# Patient Record
Sex: Female | Born: 1940 | Race: White | Hispanic: No | Marital: Married | Smoking: Former smoker
Health system: Southern US, Community
[De-identification: ages and names within clinical notes are randomized; demographics above are authoritative.]

## PROBLEM LIST (undated history)

## (undated) DIAGNOSIS — M1711 Unilateral primary osteoarthritis, right knee: Secondary | ICD-10-CM

## (undated) DIAGNOSIS — M1990 Unspecified osteoarthritis, unspecified site: Secondary | ICD-10-CM

## (undated) DIAGNOSIS — R112 Nausea with vomiting, unspecified: Secondary | ICD-10-CM

## (undated) DIAGNOSIS — E039 Hypothyroidism, unspecified: Secondary | ICD-10-CM

## (undated) DIAGNOSIS — R42 Dizziness and giddiness: Secondary | ICD-10-CM

## (undated) DIAGNOSIS — G709 Myoneural disorder, unspecified: Secondary | ICD-10-CM

## (undated) DIAGNOSIS — K219 Gastro-esophageal reflux disease without esophagitis: Secondary | ICD-10-CM

## (undated) DIAGNOSIS — F419 Anxiety disorder, unspecified: Secondary | ICD-10-CM

## (undated) DIAGNOSIS — C4490 Unspecified malignant neoplasm of skin, unspecified: Secondary | ICD-10-CM

## (undated) DIAGNOSIS — E785 Hyperlipidemia, unspecified: Secondary | ICD-10-CM

## (undated) DIAGNOSIS — Z9889 Other specified postprocedural states: Secondary | ICD-10-CM

## (undated) HISTORY — PX: ABDOMINAL HYSTERECTOMY: SHX81

## (undated) HISTORY — PX: EYE SURGERY: SHX253

## (undated) HISTORY — PX: HAND SURGERY: SHX662

## (undated) HISTORY — PX: ESOPHAGOGASTRODUODENOSCOPY: SHX1529

## (undated) HISTORY — PX: COLONOSCOPY: SHX174

## (undated) HISTORY — PX: CHOLECYSTECTOMY: SHX55

## (undated) HISTORY — PX: APPENDECTOMY: SHX54

## (undated) HISTORY — PX: BACK SURGERY: SHX140

## (undated) HISTORY — PX: KNEE SURGERY: SHX244

---

## 2005-07-30 ENCOUNTER — Ambulatory Visit: Payer: Self-pay | Admitting: Family Medicine

## 2005-10-09 ENCOUNTER — Ambulatory Visit: Payer: Self-pay | Admitting: Unknown Physician Specialty

## 2005-11-12 ENCOUNTER — Ambulatory Visit (HOSPITAL_COMMUNITY): Admission: RE | Admit: 2005-11-12 | Discharge: 2005-11-13 | Payer: Self-pay | Admitting: Neurosurgery

## 2006-01-28 ENCOUNTER — Ambulatory Visit: Payer: Self-pay | Admitting: Unknown Physician Specialty

## 2006-01-28 ENCOUNTER — Other Ambulatory Visit: Payer: Self-pay

## 2006-01-29 ENCOUNTER — Ambulatory Visit: Payer: Self-pay | Admitting: Unknown Physician Specialty

## 2006-08-05 ENCOUNTER — Ambulatory Visit: Payer: Self-pay | Admitting: Family Medicine

## 2006-08-16 ENCOUNTER — Emergency Department (HOSPITAL_COMMUNITY): Admission: EM | Admit: 2006-08-16 | Discharge: 2006-08-17 | Payer: Self-pay | Admitting: Emergency Medicine

## 2006-08-17 ENCOUNTER — Ambulatory Visit (HOSPITAL_COMMUNITY): Admission: RE | Admit: 2006-08-17 | Discharge: 2006-08-17 | Payer: Self-pay | Admitting: Emergency Medicine

## 2006-08-19 ENCOUNTER — Ambulatory Visit: Payer: Self-pay | Admitting: Internal Medicine

## 2006-08-19 ENCOUNTER — Inpatient Hospital Stay (HOSPITAL_COMMUNITY): Admission: AD | Admit: 2006-08-19 | Discharge: 2006-08-23 | Payer: Self-pay | Admitting: General Surgery

## 2006-08-20 ENCOUNTER — Encounter (INDEPENDENT_AMBULATORY_CARE_PROVIDER_SITE_OTHER): Payer: Self-pay | Admitting: Specialist

## 2006-08-21 ENCOUNTER — Encounter (INDEPENDENT_AMBULATORY_CARE_PROVIDER_SITE_OTHER): Payer: Self-pay | Admitting: *Deleted

## 2006-08-27 ENCOUNTER — Ambulatory Visit (HOSPITAL_COMMUNITY): Admission: RE | Admit: 2006-08-27 | Discharge: 2006-08-27 | Payer: Self-pay | Admitting: Internal Medicine

## 2007-08-11 ENCOUNTER — Ambulatory Visit: Payer: Self-pay | Admitting: Family Medicine

## 2008-04-08 ENCOUNTER — Encounter: Admission: RE | Admit: 2008-04-08 | Discharge: 2008-04-08 | Payer: Self-pay | Admitting: Neurosurgery

## 2008-05-06 ENCOUNTER — Ambulatory Visit (HOSPITAL_BASED_OUTPATIENT_CLINIC_OR_DEPARTMENT_OTHER): Admission: RE | Admit: 2008-05-06 | Discharge: 2008-05-06 | Payer: Self-pay | Admitting: Orthopedic Surgery

## 2008-08-11 ENCOUNTER — Ambulatory Visit: Payer: Self-pay | Admitting: Family Medicine

## 2008-11-10 ENCOUNTER — Ambulatory Visit: Payer: Self-pay | Admitting: Unknown Physician Specialty

## 2009-06-27 ENCOUNTER — Ambulatory Visit (HOSPITAL_BASED_OUTPATIENT_CLINIC_OR_DEPARTMENT_OTHER): Admission: RE | Admit: 2009-06-27 | Discharge: 2009-06-27 | Payer: Self-pay | Admitting: Orthopedic Surgery

## 2009-06-27 ENCOUNTER — Encounter (INDEPENDENT_AMBULATORY_CARE_PROVIDER_SITE_OTHER): Payer: Self-pay | Admitting: Orthopedic Surgery

## 2009-08-04 ENCOUNTER — Emergency Department (HOSPITAL_COMMUNITY): Admission: EM | Admit: 2009-08-04 | Discharge: 2009-08-04 | Payer: Self-pay | Admitting: Emergency Medicine

## 2009-08-14 ENCOUNTER — Ambulatory Visit: Payer: Self-pay | Admitting: Family Medicine

## 2009-08-14 DIAGNOSIS — Z8639 Personal history of other endocrine, nutritional and metabolic disease: Secondary | ICD-10-CM

## 2009-08-14 DIAGNOSIS — K812 Acute cholecystitis with chronic cholecystitis: Secondary | ICD-10-CM | POA: Insufficient documentation

## 2009-08-14 DIAGNOSIS — Z9189 Other specified personal risk factors, not elsewhere classified: Secondary | ICD-10-CM | POA: Insufficient documentation

## 2009-08-14 DIAGNOSIS — Z862 Personal history of diseases of the blood and blood-forming organs and certain disorders involving the immune mechanism: Secondary | ICD-10-CM

## 2009-08-15 ENCOUNTER — Ambulatory Visit: Payer: Self-pay | Admitting: Internal Medicine

## 2009-08-15 DIAGNOSIS — K5732 Diverticulitis of large intestine without perforation or abscess without bleeding: Secondary | ICD-10-CM

## 2009-08-16 ENCOUNTER — Encounter: Payer: Self-pay | Admitting: Internal Medicine

## 2009-08-16 ENCOUNTER — Telehealth (INDEPENDENT_AMBULATORY_CARE_PROVIDER_SITE_OTHER): Payer: Self-pay

## 2009-08-18 ENCOUNTER — Encounter: Payer: Self-pay | Admitting: Internal Medicine

## 2009-08-21 ENCOUNTER — Encounter: Payer: Self-pay | Admitting: Internal Medicine

## 2010-07-31 ENCOUNTER — Ambulatory Visit (HOSPITAL_COMMUNITY): Admission: RE | Admit: 2010-07-31 | Discharge: 2010-07-31 | Payer: Self-pay | Admitting: Gastroenterology

## 2010-07-31 ENCOUNTER — Ambulatory Visit: Payer: Self-pay | Admitting: Gastroenterology

## 2010-07-31 ENCOUNTER — Encounter: Payer: Self-pay | Admitting: Gastroenterology

## 2010-08-21 ENCOUNTER — Ambulatory Visit: Payer: Self-pay | Admitting: Family Medicine

## 2011-01-22 NOTE — Assessment & Plan Note (Signed)
Summary: C/O  ABD PAIN/LAW   Visit Type:  f/u Primary Care Provider:  Dr. Terance Hart  Chief Complaint:  abd pain.  [name]: Jodi [name] is a pleasant 70 y/o WF, who presents for further evaluation of recurrent abd pain. She has h/o diverticulitis in past. Last episode 07/2009 (uncomplicated on CT).   TCS 11/09 by Dr. Lynnae Prude.  Two hyperplastic polyps.  Diverticulosis sigmoid and descending colon.    Recently had similar episode as with prior diverticulitis. Started four weeks ago. Initially woke up with vomiting, loose stool. Had severe pain in lower abd. Had to be still due to pain. She tolerated symptoms for three weeks and called last week to make appt with [place]. No abx this time. Some better the last two days. Now BMs are balls then more normal stool later in the day (2-3 per day). Takes Dulcolax 2 by mouth nightly. Did not tolerate Miralax due to n/v. No brbpr, melena. No known fever. No upper gi symtpoms.  Eats three fiber bars daily. Tolerates well.    Current Medications (verified): 1)  Premarin 0.3 Mg Tabs (Estrogens Conjugated) .... Takes Every 3 Days 2)  Pm Pain Reliever .... Take 1 Tab By Mouth At Bedtime 3)  Multivitamins .... Once Daily 4)  Glucosamine-Chondroitin  Caps (Glucosamine-Chondroit-Vit C-Mn) .... Once Daily 5)  Calcium 500 Mg Tabs (Calcium Carbonate) .... Once Daily 6)  Melatonin 5 Mg Tabs (Melatonin) .... At Bedtime 7)  Vitamin C .... Once Daily 8)  Potassium .... Once Daily 9)  Ginko Biloba .... Once Daily 10)  Cissus .... Once Daily 11)  Tonic Water .... Once Daily 12)  Dulcolax 5 Mg Tbec (Bisacodyl) .... 2 By Mouth Nightly  Allergies (verified): 1)  ! Percocet 2)  ! Hydrocodone-Acetaminophen (Hydrocodone-Acetaminophen)  Review of Systems      See HPI  Vital Signs:  Patient profile:   70 year old female Height:      64 inches Weight:      198 pounds BMI:     34.11 Temp:     97.9 degrees F oral Pulse rate:   72 / minute BP  sitting:   118 / 84  (left arm) Cuff size:   regular  Vitals Entered By: Hendricks Limes LPN (July 31, 2010 9:29 AM)  Physical Exam  General:  Well developed, well nourished, no acute distress. Head:  Normocephalic and atraumatic. Eyes:  sclera nonicteric Mouth:  op moist Abdomen:  Soft. Positive BS. Moderate tenderness in lower abd with subjective guarding. No rebound. No HSM or masses.  Extremities:  No clubbing, cyanosis, edema or deformities noted. Neurologic:  Alert and  oriented x4;  grossly normal neurologically. Skin:  Intact without significant lesions or rashes. Psych:  Alert and cooperative. Normal mood and affect.  Impression & Recommendations:  Problem # 1:  DIVERTICULITIS, COLON (ICD-562.11) Suspect recurrent diverticulitis. Symptoms ongoing for several weeks now. Based on exam, recommend CT A/P to r/o complicated diverticulitis. Cipro/Flagy for 14 days.  Orders: T-BUN ([id number]) T-Creatinine Blood ([id number]) Est. Patient Level II (13086) Prescriptions: CIPROFLOXACIN HCL 500 MG TABS (CIPROFLOXACIN HCL) one by mouth two times a day for 14 days  #28 x 0   Entered and Authorized by:   Leanna Battles. Dixon Boos   Signed by:   Leanna Battles Lewis PA-C on 07/31/2010   Method used:   Electronically to        [facility]  [address] (retail)  [address]       [place], [place]  [address]       Ph: [phone]       Fax: [phone]   RxID:   [id number] METRONIDAZOLE 500 MG TABS (METRONIDAZOLE) one by mouth two times a day for 14 days  #28 x 0   Entered and Authorized by:   Leanna Battles. Dixon Boos   Signed by:   Leanna Battles Lewis PA-C on 07/31/2010   Method used:   Electronically to        [facility] (retail)       [address]       [place], [place]  [address]       Ph: [phone]       Fax: [phone]   RxID:   [id number]

## 2011-01-22 NOTE — Letter (Signed)
Summary: CT order   CT order   Imported By: Peggyann Shoals 07/31/2010 10:24:53  _____________________________________________________________________  External Attachment:    Type:   Image     Comment:   External Document

## 2011-03-08 LAB — BUN: BUN: 10 mg/dL (ref 6–23)

## 2011-03-08 LAB — CREATININE, SERUM
Creatinine, Ser: 1.02 mg/dL (ref 0.4–1.2)
GFR calc Af Amer: >60 mL/min (ref >60)
GFR calc non Af Amer: 54 mL/min — ABNORMAL LOW (ref >60)

## 2011-03-30 LAB — URINALYSIS, ROUTINE W REFLEX MICROSCOPIC
Bilirubin Urine: NEGATIVE
Glucose, UA: NEGATIVE mg/dL
Hgb urine dipstick: NEGATIVE
Nitrite: NEGATIVE
Protein, ur: NEGATIVE mg/dL
Specific Gravity, Urine: >1.03 — ABNORMAL HIGH (ref 1.005–1.030)
Urobilinogen, UA: 0.2 mg/dL (ref 0.0–1.0)
pH: 5.5 (ref 5.0–8.0)

## 2011-03-30 LAB — COMPREHENSIVE METABOLIC PANEL
ALT: 16 U/L (ref 0–35)
AST: 20 U/L (ref 0–37)
Albumin: 3.6 g/dL (ref 3.5–5.2)
Alkaline Phosphatase: 57 U/L (ref 39–117)
BUN: 14 mg/dL (ref 6–23)
CO2: 28 mEq/L (ref 19–32)
Calcium: 9.4 mg/dL (ref 8.4–10.5)
Chloride: 103 mEq/L (ref 96–112)
Creatinine, Ser: 1.12 mg/dL (ref 0.4–1.2)
GFR calc Af Amer: 58 mL/min — ABNORMAL LOW (ref >60)
GFR calc non Af Amer: 48 mL/min — ABNORMAL LOW (ref >60)
Glucose, Bld: 92 mg/dL (ref 70–99)
Potassium: 4 mEq/L (ref 3.5–5.1)
Sodium: 138 mEq/L (ref 135–145)
Total Bilirubin: 0.7 mg/dL (ref 0.3–1.2)
Total Protein: 6.3 g/dL (ref 6.0–8.3)

## 2011-03-30 LAB — URINE CULTURE: Colony Count: 10000

## 2011-03-30 LAB — DIFFERENTIAL
Basophils Absolute: 0 10*3/uL (ref 0.0–0.1)
Basophils Relative: 0 % (ref 0–1)
Eosinophils Absolute: 0.3 10*3/uL (ref 0.0–0.7)
Eosinophils Relative: 3 % (ref 0–5)
Lymphocytes Relative: 13 % (ref 12–46)
Lymphs Abs: 1.3 10*3/uL (ref 0.7–4.0)
Monocytes Absolute: 0.8 10*3/uL (ref 0.1–1.0)
Monocytes Relative: 8 % (ref 3–12)
Neutro Abs: 8.1 10*3/uL — ABNORMAL HIGH (ref 1.7–7.7)
Neutrophils Relative %: 76 % (ref 43–77)

## 2011-03-30 LAB — CBC
HCT: 37.8 % (ref 36.0–46.0)
Hemoglobin: 13.4 g/dL (ref 12.0–15.0)
MCHC: 35.3 g/dL (ref 30.0–36.0)
MCV: 93.8 fL (ref 78.0–100.0)
Platelets: 208 10*3/uL (ref 150–400)
RBC: 4.03 MIL/uL (ref 3.87–5.11)
RDW: 12.7 % (ref 11.5–15.5)
WBC: 10.6 10*3/uL — ABNORMAL HIGH (ref 4.0–10.5)

## 2011-03-30 LAB — LIPASE, BLOOD: Lipase: 18 U/L (ref 11–59)

## 2011-03-31 LAB — POCT HEMOGLOBIN-HEMACUE: Hemoglobin: 13.6 g/dL (ref 12.0–15.0)

## 2011-05-07 NOTE — Op Note (Signed)
NAMESHAWNIECE, OYOLA               ACCOUNT NO.:  [id number]   MEDICAL RECORD NO.:  [id number]          PATIENT TYPE:  AMB   LOCATION:  DSC                          FACILITY:  [facility]   PHYSICIAN:  Katy Fitch. Sypher, M.D. 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   POSTOP DIAGNOSES:  Locking stenosing tenosynovitis of right index and  right ring fingers and locking stenosing tenosynovitis of left ring  finger.   OPERATION:  1. Release of right index finger, A1 pulley and tenosynovectomy of      superficialis profundus tendons.  2. Release of right ring finger, A1 pulley.  3. Release of left ring finger, A1 pulley.   OPERATING SURGEON:  Katy Fitch. Sypher, MD   ASSISTANT:  Marveen Reeks Dasnoit, P-AC.   ANESTHESIA:  General sedation/2% lidocaine metacarpal head level block  and flexor sheath block of right index finger, right ring finger, and  left ring finger.  Supervising anesthesiologist is Dr. Jairo Ben.   INDICATIONS:  Jodi [name] is a 70 year old woman referred to the  courtesy of Dr. Maeola Harman for evaluation and management of multiple  locking fingers.   Clinical examination revealed signs of bilateral ring finger stenosing  synovitis and a locked and stiff right index finger.   Ms. Trigg had a traumatic deficit between active and passive motion of  the index finger.   After detailed informed consent in the office as well as informed  consent at the ambulatory surgical center, she is brought to the  operating room at this time anticipating release of her right index  finger, A1 pulley, her right ring finger, A1 pulley, and her left ring  finger, A1 pulley.   Preoperatively, questions were invited and answered in detail.   She  understands that we may need to perform a synovectomy to recover full  motion of the right index finger.   PROCEDURE:  Tenleigh Byer is brought to the operating room and placed in  supine position on the operating table.   Following an anesthesia consult with Dr. Jean Rosenthal, monitored anesthesia  care was recommended and accepted.   Ms. Vinal was brought to room 5, placed in supine position on the  operating table, and IV sedation provided.  The right arm was prepped  with Betadine soap and solution, sterilely draped.  A pneumatic  tourniquet was applied to the proximal brachium.  IV access was provided  through the left antecubital fossa.   The right and left arms were prepped with Betadine soap solution,  sterilely draped.  Following exsanguination of the right arm with an  Esmarch bandage, an arterial tourniquet on the proximal brachium  inflated to 220 mmHg.  The procedure commenced with a short oblique  incision directly over the A1 pulleys of the right index and right ring  fingers.  Subcutaneous tissues were carefully divided releasing the  palmar  fascia.  The A1 pulleys were isolated.   In the index finger, there was noted be hypertrophic tenosynovium and a  ganglion cyst proximal at the A1 pulley.  The cyst was excised.  The A1  pulley released.  The tendons delivered, and a fibrotic tenosynovial  cuff removed allowing motion of the superficialis and profundus tendons  to return.  Thereafter, full active and passive range of motion of the  fingers recovered.   Bleeding points were electrocauterized by bipolar current followed by  repair of the skin with horizontal mattress sutures of 5-0 nylon.  Attention then directed to the right ring finger.  Short transverse  incision was used to expose the palmar fascia.  This was released with  scissors followed by isolation at the A1 pulley.  The pulley was split  in its midline with scalpel and scissors.  Tendons delivered and  found  not to have an investing cuff of fibrotic tenosynovium.   This wound was likewise repaired with mattress suture of 5-0 nylon.  The  right hand was then dressed with Xeroflo sterile gauze and Ace bandage.  The tourniquet was released with immediate capillary fill of the fingers  and thumb.  Our attention was then directed to the left hand.  The left  hand, wrist, and distal forearm were exsanguinated with an Esmarch  bandage and an Esmarch used on the proximal forearm tourniquet.  A short  incision was fashioned directly over the left ring finger A1 pulley.  Subcutaneous tissues were carefully divided taking care to release of  the palmar fascia.  The pulley was isolated, followed by placement of  retractors.  The A1 pulley was split with scalpel and scissors.  The  tendons delivered.  There was no significant cuff of fibrotic  tenosynovium.   Thereafter, full active range of motion of the ring fingers recovered.   The wound was then repaired with mattress sutures of 5-0 nylon.  The  left hand was dressed with Xeroflo sterile gauze and Tegaderm dressing  to allow exposure to water immediately.  An Ace wrap was provided for  compression.   Ms. Testerman was awakened from sedation, transferred to the recovery room  in stable signs.  She will return to the care of her husband with a  prescription for Darvocet-N 100 one p.o. q. 4-6 hours p.r.n. pain, 20  tablets without refill.      Katy Fitch Sypher, M.D.  Electronically Signed     RVS/MEDQ  D:  05/06/2008  T:  05/06/2008  Job:  [id number]

## 2011-05-07 NOTE — Op Note (Signed)
NAMEHINLEY, BRIMAGE               ACCOUNT NO.:  [id number]   MEDICAL RECORD NO.:  [id number]          PATIENT TYPE:  AMB   LOCATION:  DSC                          FACILITY:  [facility]   PHYSICIAN:  Katy Fitch. Sypher, M.D. 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   OPERATION:  1. Release of A1 pulley, left index finger, followed by      tenosynovectomy of superficialis profundus tendon for synovial      biopsy.  2. A1 pulley release, left long finger with tenosynovectomy of flexor      digitorum superficialis and profundus tendons for biopsy.   OPERATING SURGEON:  Katy Fitch. Sypher, MD   ASSISTANT:  Marveen Reeks. Dasnoit, PA-C   ANESTHESIA:  Lidocaine 2% metacarpal head level block and flexor sheath  block of left index and long finger supplemented by IV sedation.   SUPERVISING ANESTHESIOLOGIST:  Dr. Jean Rosenthal.   INDICATIONS:  Jodi [name] is a 70 year old woman familiar with our  practice.  She has a history of stenosing tenosynovitis treated in the  past with trigger finger release.  She returned with recurrent stenosing  tenosynovitis of left index and long fingers.  She has had a previous  left ring finger A1 pulley release.   We offered steroid injection versus surgery to relieve the A1 pulley  stenosis.  Having had a past experience with injection, she elected to  proceed directly to release of A1 pulleys.  Also, she was noted  preoperatively to have marked swelling of her flexor sheaths.  We  advised her that there was a significant chance we need to perform a  synovial biopsy and tenosynovectomy to resolve this predicament.   After informed consent, she was brought to the operating  room at this  time.   PROCEDURE:  Jannett Schmall is brought to the operating room and placed in  supine position upon the operating table.   Following light sedation, the left arm was prepped with Betadine soap  solution and sterilely draped.  A pneumatic tourniquet was applied  proximal left brachium.   Following exsanguination of the left arm with Esmarch bandage, arterial  tourniquet was inflated to 230 mmHg.   Procedure commenced with oblique incision directly over the A1 pulleys.   Soft tissue was gently divided revealing the palmar fascia.  The  percutaneous fibers of the palmar fascia was released superficial to the  flexor sheath.  Blunt retractors were placed protecting the  neurovascular structures.  The A1 pulley of the index finger was noted  to be quite thickened, inflamed, and with tenosynovium bulging  proximally.   The A1 pulley was split with scalpel and scissors followed by elevation  of the superficialis profundus tendons.  The tendons were invested with  a rather inflammatory tenosynovium that was not highly vascular.  This  was meticulously stripped with  scissors and rongeur dissection.  All of  the removed tenosynovium was passed off in formalin for pathologic  evaluation.   A similar procedure was performed for the long finger where there was  marked tenosynovitis noted at the level of the A1 pulley and preputial  fold.  All tenosynovium was stripped from the superficialis profundus  tendons.   Ms. Mittal had a small degree of discomfort during stripping of the  profundus tendon of the long finger, which was treated with additional  lidocaine anesthesia proximally in the palm.   The wounds were checked for bleeding points.  They were subsequently  repaired with interrupted suture of 5-0 nylon.   COMPLICATIONS:  There were no apparent complications.   Ms. Magda tolerated surgery and anesthesia well.  She was transferred  to recovery room in stable  signs.   She was noted to be tearful during the procedure at induction of  sedation and during the procedure.  Our nurse anesthetist repeatedly  after what was troubling her.  We did not ever receive a answer.  She  was fairly deeply sedated during the procedure.   Ms. Brazzle was transferred to recovery room in stable signs.  We will  discharge her to the care of her husband with a prescription for  Darvocet- N 100 1 p.o. for 6 hours p.r.n. pain 20 tablets refill.      Katy Fitch Sypher, M.D.  Electronically Signed     RVS/MEDQ  D:  06/27/2009  T:  06/28/2009  Job:  [id number]

## 2011-05-10 NOTE — Op Note (Signed)
NAMEALVINE, Jodi [name]               ACCOUNT NO.:  [id number]   MEDICAL RECORD NO.:  [id number]          PATIENT TYPE:  INP   LOCATION:  A331                          FACILITY:  [facility]   PHYSICIAN:  Lionel December, M.D.    DATE OF BIRTH:  [birth year]   DATE OF PROCEDURE:  08/20/2006  DATE OF DISCHARGE:                                  PROCEDURE NOTE   PROCEDURE:  Endoscopic retrograde cholangiopancreatogram with endoscopic  sphincterotomy, stone extraction, placement of a pancreatic stent and cold  biopsy.   ENDOSCOPIST:  Lionel December, M.D.   INDICATIONS:  Jodi [name] is a 70 year old Caucasian female who presents with  right upper quadrant pain.  On workup, she has cholelithiasis, a very  dilated bile duct and suspected have a CBD stone.  Her transaminase are also  elevated.  She is undergoing therapeutic ERCP.  Procedure risks were  reviewed with the patient and informed consent was obtained.   MEDICATIONS FOR CONSCIOUS SEDATION AND ANESTHESIA:  Please see anesthesia  record for details.  For the procedure, she was given glucagon 0.75 mg IV.   FINDINGS:  Procedure was performed in the OR.  After the patient was placed  under anesthesia, she was intubated and placed in a semi-prone position.  A  therapeutic Olympus videoscope duodenoscope was passed via oropharynx  without any difficulty into esophagus and stomach.  Antral mucosa was  normal.  Scope was passed across a normal-appearing pyloric channel into  bulb and descending duodenum.  Ampulla of Vater was normal.  CBD was  cannulated with RX 44 Hydratome 0.035 Hydra Jagwire.  CBD was selectively  cannulated.  The CBD and CHD were markedly dilated, about 14-15 mm in  diameter.  Intrahepatic biliary radicals were also filled and prominent.  There was no obvious filling defect.  Sphincterotomy was performed with a  gush of contrast and bile.  There was single 5- to 6-mL stone that came out  spontaneously.  The stone balloon extractor was  passed through the bile duct  and across the ampulla to a diameter of 10-12 mm and sphincterotomy was wide  open.  The submucosa at sphincterotomy had a small nodule.  It was decided  to obtain a pancreatogram.  The pancreatic duct was gently filled with a  dilute contrast.  Normal caliber and no filling defects.  A 5-French 3-cm 18  Geenan stent was placed in the pancreatic duct to minimize risk of  pancreatitis.  Biopsy was taken from this nodular submucosa; only 2 samples  were obtained as it was a rather small piece of tissue.  Endoscope was  withdrawn.  The patient tolerated the procedure well and she is in the  process of being extubated.   FINAL DIAGNOSES:  1. Markedly dilated biliary system with a single stone removed following      sphincterotomy.  2. Abnormal appearance to ampullary submucosal, which were biopsied for      Histology.  3. A 5-French 3-cm Geenan stent placed in the pancreatic duct to minimize      risk of pancreatitis.   RECOMMENDATIONS:  1. Clear  liquids.  .  2. She will have a CBC, LFTs and amylase in a.m.  3. For laparoscopic cholecystectomy in a.m.  .  4. She will return for plain abdominal film to document the pancreatic      stent has pass spontaneously.      Lionel December, M.D.  Electronically Signed     NR/MEDQ  D:  08/20/2006  T:  08/21/2006  Job:  [id number]   cc:   Barbaraann Barthel, M.D.  Fax: [phone]

## 2011-05-10 NOTE — Consult Note (Signed)
Jodi [name], [name]               ACCOUNT NO.:  [id number]   MEDICAL RECORD NO.:  [id number]          PATIENT TYPE:  INP   LOCATION:  A331                          FACILITY:  [facility]   PHYSICIAN:  R. Roetta Sessions, M.D. DATE OF BIRTH:  02-15-1941   DATE OF CONSULTATION:  08/19/2006  DATE OF DISCHARGE:                                   CONSULTATION   REASON FOR CONSULTATION:  Elevated liver function tests, need ERCP.   [name]:  The patient is a 70 year old Caucasian female  who was admitted today with acute cholecystitis.  She came to the ED on  Saturday with the acute onset of epigastric pain which she felt might be  indigestion.  She was evaluated in the ER, had normal LFTs except for an AST  of 55.  Her amylase was 119 and lipase 25.  She came back Sunday morning for  abdominal ultrasound and was found to have sludge and small gallstones  within the gallbladder, extrahepatic and mild intrahepatic biliary ductal  dilatation, the entire common bile duct could not be seen, therefore,  additional CBD stone was not excluded.  Her pancreatic duct measured 3-4 mm,  slightly prominent, and her proximal common bile duct was 11.5 mm.  She has  splenic and liver granulomata, as well.  She had a CT which revealed a CBD  of 11 mm at the pancreatic head, there was distention of the gallbladder and  a distal CBD stone could not be excluded.  She saw Dr. Malvin Johns today in his  office and was directly admitted for worsening abdominal pain.  She had an  urgent abdominal ultrasound which revealed the gallbladder wall slightly  thickened, at this point, at 3.5 mm and CBD was, again, 11-12 mm.  Her LFTs  were noted to be elevated, total bilirubin 1.5, alkaline phos 247, AST 467,  ALT 506, albumin 3.4, white count 8800.  The patient started having right  upper quadrant abdominal pain this morning.  She has had nausea but no  vomiting.  She has chronic intermittent constipation and has  not had a bowel  movement in a couple of days.  She denies any melena, rectal bleeding,  fever, or chills.   MEDICATIONS AT HOME:  Premarin 0.3 mg every three days, quinidine sulfate  b.i.d., Feldene 20 mg q.h.s., PM pain reliever every night.   ALLERGIES:  1. DARVOCET.  2. PERCOCET, causes itching if she takes several of them within a 24 hour      period of time.   PAST MEDICAL HISTORY:  Negative for chronic illnesses except for chronic leg  cramps.  She had neck surgery in 1999 and 2006.  Appendectomy in 1969.  Back  surgery in 1998.  Hysterectomy with BSO in 1997.  Left knee surgery in 2007.  Arthroscopy left foot surgery 1999.  Colonoscopy in the mid 1990s with  diverticulosis and pin point ulcers, according to the patient.  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   FAMILY HISTORY:  Negative for chronic GI illnesses, colorectal cancer, or  liver disease.   SOCIAL HISTORY:  She is married with three grown children.  She quit smoking  over four years ago.  No alcohol use.   REVIEW OF SYSTEMS:  Per HPI for GI.  CONSTITUTIONAL:  No weight loss.  CARDIOPULMONARY:  No chest pain or shortness of breath.   PHYSICAL EXAMINATION:  VITAL SIGNS:  Temperature 98.4, pulse 74, respirations 20, blood pressure  118/64, height 64 inches, weight 182.  GENERAL:  Pleasant, elderly moderately obese Caucasian female in no acute  distress.  SKIN:  Warm and dry, no jaundice.  HEENT:  Sclerae nonicteric.  Oropharyngeal mucosa moist and pink.  No  lymphadenopathy or thyromegaly.  CHEST:  Lungs clear to auscultation.  CARDIAC:  Exam reveals regular rate and rhythm, normal S1 and S2, no  murmurs, gallops, and rubs.  ABDOMEN:  Positive bowel sounds, obese but symmetrical, soft.  She has mild  to moderate right upper quadrant abdominal pain with positive Murphy's sign,  no rebound tenderness.  No hepatosplenomegaly or masses.  EXTREMITIES:  No edema.   LABORATORY  DATA:  As mentioned above.  In addition, hemoglobin 13.2, BUN 10,  creatinine 1, potassium 3.9, sodium 135, platelets 242,000.   IMPRESSION:  The patient is a 70 year old lady with acute cholecystitis and  probable choledocholithiasis given dilated CBD and new onset elevated LFTs.   RECOMMENDATIONS:  1. ERCP tomorrow followed by cholecystectomy as per Dr. Malvin Johns.  2. Will repeat LFTs, amylase, and lipase in the morning.  3. PT, PTT, bleeding time today.  4. Levaquin 250 mg IV on call to ERCP.  5. ERCP with possible sphincterotomy and stone extraction plus/minus      stenting explained to the patient regarding risks, alternatives, and      benefits.  10% chance of post ERCP pancreatitis explained to the      patient as well as risk of bleeding and infection.  The patient's      questions were answered and she has consented to proceed with procedure      as planned.      Tana Coast, P.AJonathon Bellows, M.D.  Electronically Signed    LL/MEDQ  D:  08/19/2006  T:  08/19/2006  Job:  [id number]   cc:   Barbaraann Barthel, M.D.  Fax: [phone]

## 2011-05-10 NOTE — Discharge Summary (Signed)
Jodi [name], Jodi [name]               ACCOUNT NO.:  [id number]   MEDICAL RECORD NO.:  [id number]          PATIENT TYPE:  INP   LOCATION:  A331                          FACILITY:  [facility]   PHYSICIAN:  Barbaraann Barthel, M.D. DATE OF BIRTH:  [birth year]   DATE OF ADMISSION:  08/19/2006  DATE OF DISCHARGE:  09/01/2007LH                                 DISCHARGE SUMMARY   DIAGNOSES:  1. Choledocholithiasis.  2. Cholecystitis.  3. Cholelithiasis.   PROCEDURES:  1. ERCP on August 20, 2006.  2. August 21, 2006, laparoscopic cholecystectomy.   CONSULTATIONS:  Dr. Karilyn Cota of the GI service.   NOTE:  This is a 70 year old white female who was admitted with a several-  day history of abdominal pain, radiating to her back.  She had a sonogram  which was positive for stones.  She initially had liver function studies  that were within normal limits, with a normal amylase, however, her liver  function studies increased.  When she was admitted a repeat sonogram  revealed a dilated common bile duct and a bilirubin 1.5 with very elevated  liver function studies.  I obtained a GI consult, suspecting  choledocholithiasis.  This was confirmed at ERCP, were choledocholithotomy  was performed, a sphincterotomy was performed, and a stent was placed in the  pancreatic duct.  The patient then, in 24 hours, underwent a laparoscopic  cholecystectomy.  This was uneventful.  We drained this as well, and there  was no problem for this.  On the second postoperative day there was minimal  Jackson-Pratt drainage.  Her abdomen was much improved, and she was  tolerating liquids without nausea or vomiting, and she was doing quite well,  and was discharged on her second postoperative day.   LABORATORY FUNCTIONS:  The patient, on August 19, 2006, had a white count of  8.8 with an H&H of 13.2 and 38.6.  On August 22, 2006, her white count was  10,000 with an H&H 11.2 and 32.7.  Her metabolic-7 was grossly within normal  limits.  Her PT, INR and PTT were not elevated.  She, as stated, had a bump  in her liver function studies when she was admitted with a bilirubin that  was essentially normal, then went up to 1.5, and AST and ALTs very elevated.  These later, after surgery, diminished with her bilirubin remaining within  normal limits, and a downward trend of her liver function studies.  She  continued to improve significantly.  At time of discharge she was tolerating  p.o. well, her wounds were clean, her Jackson-Pratt drain was removed, after  noting just minimal drainage of serosanguineous nature.  All of her wounds  were clean and she had no leg pain, shortness of breath or any other  discomfort.  She was discharged and the follow-up arrangements were being  made.  She will see me in my office on August 28, 2006.  She is discharged  on Darvocet-N 100 one tablet every 4 hours as needed for pain, and to take  milk of magnesia 1 ounce as needed for her bowels, and  she will have Colace  also 100 mg as needed for her bowels, and she is told to refrain from any  aspirin products and to continue to continue any other medications from her  family doctors,  her preoperative medicines as per her family doctor.  She  was also given a prescription for Ambien 5 mg to help her at night, hour of  sleep.  She is excused from work.  She is told to advance her activity as  tolerated.  She is permitted go indoors and outdoors, and up and down the  stairs.  She is told to no heavy lifting,  straining or sex or driving at the present time, told to clean her wounds  with alcohol and change the drain site as needed.  She has been given  materials for this.  We will follow her perioperatively.  She is told to  call me should she have any problems and she will make arrangements with the  GI service as well for follow-up.      Barbaraann Barthel, M.D.  Electronically Signed     WB/MEDQ  D:  08/23/2006  T:  08/25/2006   Job:  [id number]   cc:   Lionel December, M.D.  [address]  [place]  [place] [address]

## 2011-05-10 NOTE — Op Note (Signed)
Jodi [name], Jodi [name]               ACCOUNT NO.:  [id number]   MEDICAL RECORD NO.:  [id number]          PATIENT TYPE:  INP   LOCATION:  3009                         FACILITY:  [facility]   PHYSICIAN:  Danae Orleans. Venetia Maxon, M.D.  DATE OF BIRTH:  14-Aug-1941   DATE OF PROCEDURE:  11/12/2005  DATE OF DISCHARGE:                                 OPERATIVE REPORT   PREOPERATIVE DIAGNOSES:  Cervical herniated disk with cervical spondylosis,  degenerative disk disease, and cervical radiculopathy, C4-C5 and C6-C7  levels.   POSTOPERATIVE DIAGNOSES:  Cervical herniated disk with cervical spondylosis,  degenerative disk disease, and cervical radiculopathy, C4-C5 and C6-C7  levels.   PROCEDURE:  Anterior cervical decompression and fusion, C4-C5 and C6-C7  levels with Peak interbody anterior cervical cages with morcellized bone  autograft with anterior cervical plating.   SURGEON:  Danae Orleans. Venetia Maxon, M.D.   ASSISTANT:  Hilda Lias, M.D.   ANESTHESIA:  General endotracheal anesthesia.   ESTIMATED BLOOD LOSS:  Minimal.   COMPLICATIONS:  None.   DISPOSITION:  Recovery.   INDICATIONS:  Jodi [name] is a 70 year old woman with cervical  spondylitic disk herniation and significant neck and upper extremity pain  and weakness.  She had kyphosis centered at C4-C5 with a considerable amount  of neck pain.  It was elected to perform an anterior cervical decompression  and fusion at the C4-C5 and C6-C7 levels.   DESCRIPTION OF PROCEDURE:  Ms. Gueye was brought to the operating room.  Following a satisfactory and uncomplicated induction of general endotracheal  anesthesia and placement of intravenous lines, the patient was placed in a  supine position on the operating table.  Her neck was placed in slight  extension. She was placed in 10 pounds of Halter traction.  Her anterior  neck was then prepped and draped in the usual sterile fashion.  The area of  intended incision was infiltrated with 0.25%  Marcaine and 0.5% lidocaine  with 1:200,000 epinephrine.  The incision was made on the right side of the  midline because of her previous exposure was done on the right side.  She  had previously had a C5-C6 anterior cervical decompression in the past.  The  platysmal layer was incised.  Subplatysmal dissection was performed exposing  the anterior border of the sternocleidomastoid muscle keeping the esophagus  and trachea medial and the carotid sheath lateral.  The soft tissues were  dissected exposing the C4-C5 and C6-C7 levels with the previously operated  C5-C6 level.  A bent spinal needle was placed at what was felt to be the C4-  C5 level, and this was confirmed on intraoperative x-ray.  Subsequently, the  longus colli muscles were taken down from the anterior cervical spine from  C4-C5 and C6-C7, and initially at the C6-C7 level, a self-retaining  retractor was placed to facilitate exposure.  The microscope was brought  into the field.  The disk space was evacuated of disk material, and there  was significant spondylitic degeneration at this level.  A disk space  spreader was placed.  A high-speed drill was used under microscopic  visualization to decorticate the endplates of C6 and C7.  A large central  disk herniation was removed decompressing the central spinal cord dura and  both C7 nerve roots as they extended out the neural foraminae.  After trial  sizing, a 6-mm Peak interbody cage was selected, packed with morcellized  bone autograft which was obtained from the drilling of the endplates.  This  was then tamped into position into the interspace and countersunk  appropriately.  Attention was then turned to the C4-C5 level where a similar  decompression was performed.  At this level, because of the significant  kyphosis, a slightly larger graft was placed.  A 7-mm graft was placed, also  packed with morcellized bone autograft, inserted in the interspace, and  countersunk  appropriately.  After taking the traction weight off, a 14-mm  anterior cervical plate was affixed to the anterior cervical spine using  variable angle 14-mm screws, two at C4, two at C5, and at the C6-C7 level, a  16-mm plate was placed with similarly size screws.  All screws had excellent  purchase.  Locking mechanisms were engaged.  The final x-ray demonstrated  well positioned anterior cervical plates and interbody grafts.  The wound  was then copiously irrigated with bacitracin/saline. The soft tissues were  inspected and found to be in good repair.  The platysmal layer was  reapproximated with 3-0 Vicryl sutures.  The skin edges were reapproximated  with interrupted 3-0 Vicryl subcuticular stitch.  The wound was dressed with  Dermabond.  The patient was extubated in the operating room and taken to the  recovery room in stable, satisfactory condition having tolerated here  operation well.  Counts were correct at the end of the case.      Danae Orleans. Venetia Maxon, M.D.  Electronically Signed     JDS/MEDQ  D:  11/12/2005  T:  11/12/2005  Job:  [id number]

## 2011-05-10 NOTE — Op Note (Signed)
Jodi [name], Jodi [name]               ACCOUNT NO.:  [id number]   MEDICAL RECORD NO.:  [id number]          PATIENT TYPE:  INP   LOCATION:  A331                          FACILITY:  [facility]   PHYSICIAN:  Barbaraann Barthel, M.D. DATE OF BIRTH:  12-Jun-1941   DATE OF PROCEDURE:  08/21/2006  DATE OF DISCHARGE:  08/23/2006                                 OPERATIVE REPORT   NOTE:  This the second dictation.  The first one was apparently lost in the  computer system.   SURGEON:  Barbaraann Barthel, M.D.   PREOPERATIVE DIAGNOSES:  1. Choledocholithiasis, status post endoscopic retrograde      cholangiopancreatography on August 20, 2006.  2. Cholecystitis.  3. Cholelithiasis.   PROCEDURES:  Laparoscopic cholecystectomy.   NOTE:  This is a 70 year old white female who was admitted for biliary colic  of several days' duration.  She was admitted with elevated liver function  studies suggestive of common bile duct stones.  We consulted the GI service  and plans were made for an ERCP prior to cholecystectomy.  This was carried  out by the GI service on August 20, 2006, and we made plans for a  laparoscopic cholecystectomy.  We discussed the procedure in detail with the  patient, discussing complications not limited to but including bleeding,  infection, damage to bile ducts, perforation of organs, transitory diarrhea  and the possibility that an open cholecystectomy might be required.  Informed consent was obtained.   GROSS OPERATIVE FINDINGS:  Those suggestive of acute cholecystitis with  cholelithiasis, right upper quadrant otherwise appeared to be normal.  There  was stones within the gallbladder.   TECHNIQUE:  The patient was placed in supine position.  After the adequate  administration of general anesthesia via endotracheal intubation, her entire  was prepped with Betadine solution and draped the in usual manner.  A Foley  catheter had been aseptically inserted.  Prior to this, after prepping  and  draping, a periumbilical incision was carried out over the superior aspect  of the umbilicus.  The fascia was grasped with a sharp towel clip and a  Veress needle was inserted and confirmed in position with a saline drop  test.  We then insufflated the abdomen with approximately 3.5 L of CO2 and  then using the Visiport technique, an 11 mm cannula was placed in the  umbilicus and then an 11-mm cannula and two 5-mm cannulas were placed in the  abdomen under direct vision.  The 11 mm cannula was placed in the  epigastrium and 5 mm cannulas in the right upper quadrant laterally.  The  gallbladder was grasped.  Adhesions were taken down.  The cystic duct was  clearly visualized and triply silver-clipped and divided, as was the cystic  artery.  The gallbladder was then removed using the Endosac device to remove  this through the epigastric incision.  We checked for hemostasis.  We  irrigated with normal saline solution.  I elected to leave Surgicel in the  liver bed as well as a Jackson-Pratt drain, which exited through one of 5-mm  cannula sites in  the lateral abdomen.  After checking for hemostasis and  irrigating, we then desufflated the abdomen.  I elected to use 0.5%  Sensorcaine around all the surgical ports to help with postoperative  comfort.  The fascia was closed in the area of the epigastrium and the  umbilicus with 0 Polysorb sutures, and all wounds were closed with a  stapling device.  The drain was sutured in place with 3-0 nylon.  The  patient did well during the surgery.  Estimated blood loss was minimal.  The  patient received 1100 mL of crystalloids intraoperatively.  A Jackson-Pratt  drain was placed in the liver bed as stated.  Prior to closure all sponge,  needle and instrument counts were found to be correct.  Estimated blood loss  was minimal.      Barbaraann Barthel, M.D.  Electronically Signed     WB/MEDQ  D:  09/19/2006  T:  09/22/2006  Job:  [id number]   cc:    Lionel December, M.D.  [address]  [place]  [place] [address]

## 2011-05-17 ENCOUNTER — Emergency Department: Payer: Self-pay | Admitting: Emergency Medicine

## 2011-09-18 ENCOUNTER — Ambulatory Visit: Payer: Self-pay | Admitting: Family Medicine

## 2011-09-18 LAB — POCT HEMOGLOBIN-HEMACUE: Hemoglobin: 14.4

## 2012-02-13 ENCOUNTER — Ambulatory Visit: Payer: Self-pay | Admitting: Otolaryngology

## 2012-02-20 ENCOUNTER — Ambulatory Visit: Payer: Self-pay | Admitting: Otolaryngology

## 2012-09-18 ENCOUNTER — Ambulatory Visit: Payer: Self-pay | Admitting: Family Medicine

## 2013-02-01 ENCOUNTER — Ambulatory Visit: Payer: Self-pay | Admitting: Internal Medicine

## 2013-02-25 ENCOUNTER — Ambulatory Visit: Payer: Self-pay | Admitting: Unknown Physician Specialty

## 2013-03-17 ENCOUNTER — Ambulatory Visit: Payer: Self-pay | Admitting: Unknown Physician Specialty

## 2013-07-28 ENCOUNTER — Ambulatory Visit: Payer: Self-pay | Admitting: Unknown Physician Specialty

## 2013-09-20 ENCOUNTER — Ambulatory Visit: Payer: Self-pay | Admitting: Family Medicine

## 2014-04-29 DIAGNOSIS — G8929 Other chronic pain: Secondary | ICD-10-CM | POA: Insufficient documentation

## 2014-10-05 ENCOUNTER — Ambulatory Visit: Payer: Self-pay | Admitting: Family Medicine

## 2014-11-07 DIAGNOSIS — R102 Pelvic and perineal pain: Secondary | ICD-10-CM | POA: Insufficient documentation

## 2014-11-23 ENCOUNTER — Encounter: Payer: Self-pay | Admitting: Family Medicine

## 2014-12-23 ENCOUNTER — Encounter: Payer: Self-pay | Admitting: Obstetrics and Gynecology

## 2015-02-16 DIAGNOSIS — M7061 Trochanteric bursitis, right hip: Secondary | ICD-10-CM | POA: Insufficient documentation

## 2015-04-16 NOTE — Op Note (Signed)
PATIENT NAME:  Jodi [name], FIGIEL MR#:  [id number] DATE OF BIRTH:  17-Nov-1941  DATE OF PROCEDURE:  02/20/2012  PREOPERATIVE DIAGNOSIS:  1. 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 2. 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  POSTOPERATIVE DIAGNOSIS:  1. 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 2. 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  PROCEDURE PERFORMED:   1. Bilateral ptosis repair (Fasanella-Servat procedure). 2. Bilateral upper eyelid functional blepharoplasties.  SURGEON: Janalee Dane, MD    DESCRIPTION OF PROCEDURE: The patient was placed in the supine position on the Operating Room table. After general LMA anesthesia had been induced, the patient was turned approximately 110 degrees from Anesthesia in a counterclockwise position. The eyes were locally anesthetized topically with proparacaine, and then after surgical marking local anesthesia was injected both anteriorly and posteriorly using corneal shield in each eye as each eye was instrumented. Beginning on the patient's right side, a fusiform incision was made in the markings for a conservative skin and orbicularis oculi muscle resection. The skin and muscle were resected. Meticulous hemostasis was achieved. An identical procedure was performed on the left upper eyelid. Attention was returned back to the right upper eyelid where the eyelid was inverted using a Demarres retractor and using the Putterman clamp. Approximately 5 mm of conjunctiva and Mueller's muscle was gathered in the clamp, and 5-0 fast absorbing gut on an S14 double-armed needle was passed in a horizontal running mattress fashion. A #15 blade was used to excise the conjunctiva and Mueller's muscle. The second row of sutures was passed from medial to lateral. The knot was tied in subconjunctival fashion laterally. The eyelid was returned to its native position, and the external upper  eyelid incision was closed with 7-0 nylon. Iced gauze was placed immediately on the eye, and an identical Fasanella-Servat-type procedure was performed on the left eye with slightly more conjunctiva and Mueller's muscle (7 mm). Once again both incisions were closed and iced gauze was placed on the eye.  The patient was returned to Anesthesia, allowed to emerge from anesthesia in the Operating Room, and taken to the recovery room in stable condition. There were no complications. Estimated blood loss was less than 5 mL.   ____________________________ J. Nadeen Landau, MD jmc:cbb D: 02/21/2012 08:07:40 ET T: 02/21/2012 10:39:44 ET JOB#: [id number]  cc: Janalee Dane, MD, <Dictator> Nicholos Johns MD ELECTRONICALLY SIGNED 02/24/2012 10:05

## 2015-06-04 ENCOUNTER — Encounter: Payer: Self-pay | Admitting: Emergency Medicine

## 2015-06-04 ENCOUNTER — Emergency Department (INDEPENDENT_AMBULATORY_CARE_PROVIDER_SITE_OTHER)
Admission: EM | Admit: 2015-06-04 | Discharge: 2015-06-04 | Disposition: A | Discharge status: Home / Self Care | Payer: Medicare Other | Source: Home / Self Care | Attending: Family Medicine | Admitting: Family Medicine

## 2015-06-04 DIAGNOSIS — N3090 Cystitis, unspecified without hematuria: Secondary | ICD-10-CM | POA: Diagnosis not present

## 2015-06-04 HISTORY — DX: Hyperlipidemia, unspecified: E78.5

## 2015-06-04 HISTORY — DX: Anxiety disorder, unspecified: F41.9

## 2015-06-04 LAB — POCT URINALYSIS DIP (MANUAL ENTRY)
Bilirubin, UA: NEGATIVE
GLUCOSE UA: NEGATIVE
NITRITE UA: POSITIVE
PH UA: 5.5 (ref 5–8)
Spec Grav, UA: 1.02 (ref 1.005–1.03)
UROBILINOGEN UA: 0.2 (ref 0–1)

## 2015-06-04 MED ORDER — CEPHALEXIN 500 MG PO CAPS: 500.0000 mg | ORAL_CAPSULE | Freq: Two times a day (BID) | ORAL | Status: DC

## 2015-06-04 NOTE — Discharge Instructions (Signed)
Increase fluid intake. May use non-prescription AZO for about two days, if desired, to decrease urinary discomfort.  If symptoms become significantly worse during the night or over the weekend, proceed to the local emergency room.    Urinary Tract Infection Urinary tract infections (UTIs) can develop anywhere along your urinary tract. Your urinary tract is your body's drainage system for removing wastes and extra water. Your urinary tract includes two kidneys, two ureters, a bladder, and a urethra. Your kidneys are a pair of bean-shaped organs. Each kidney is about the size of your fist. They are located below your ribs, one on each side of your spine. CAUSES Infections are caused by microbes, which are microscopic organisms, including fungi, viruses, and bacteria. These organisms are so small that they can only be seen through a microscope. Bacteria are the microbes that most commonly cause UTIs. SYMPTOMS  Symptoms of UTIs may vary by age and gender of the patient and by the location of the infection. Symptoms in young women typically include a frequent and intense urge to urinate and a painful, burning feeling in the bladder or urethra during urination. Older women and men are more likely to be tired, shaky, and weak and have muscle aches and abdominal pain. A fever may mean the infection is in your kidneys. Other symptoms of a kidney infection include pain in your back or sides below the ribs, nausea, and vomiting. DIAGNOSIS To diagnose a UTI, your caregiver will ask you about your symptoms. Your caregiver also will ask to provide a urine sample. The urine sample will be tested for bacteria and white blood cells. White blood cells are made by your body to help fight infection. TREATMENT  Typically, UTIs can be treated with medication. Because most UTIs are caused by a bacterial infection, they usually can be treated with the use of antibiotics. The choice of antibiotic and length of treatment depend  on your symptoms and the type of bacteria causing your infection. HOME CARE INSTRUCTIONS  If you were prescribed antibiotics, take them exactly as your caregiver instructs you. Finish the medication even if you feel better after you have only taken some of the medication.  Drink enough water and fluids to keep your urine clear or pale yellow.  Avoid caffeine, tea, and carbonated beverages. They tend to irritate your bladder.  Empty your bladder often. Avoid holding urine for long periods of time.  Empty your bladder before and after sexual intercourse.  After a bowel movement, women should cleanse from front to back. Use each tissue only once. SEEK MEDICAL CARE IF:   You have back pain.  You develop a fever.  Your symptoms do not begin to resolve within 3 days. SEEK IMMEDIATE MEDICAL CARE IF:   You have severe back pain or lower abdominal pain.  You develop chills.  You have nausea or vomiting.  You have continued burning or discomfort with urination. MAKE SURE YOU:   Understand these instructions.  Will watch your condition.  Will get help right away if you are not doing well or get worse. Document Released: 09/18/2005 Document Revised: 06/09/2012 Document Reviewed: 01/17/2012 [facility] Patient Information 2015 [place], [place]. This information is not intended to replace advice given to you by your health care provider. Make sure you discuss any questions you have with your health care provider.

## 2015-06-04 NOTE — ED Notes (Signed)
Reports onset of dysuria and hematuria last night.

## 2015-06-04 NOTE — ED Provider Notes (Signed)
CSN: [id number]     Arrival date & time 06/04/15  1114 History   First MD Initiated Contact with Patient 06/04/15 1147     Chief Complaint  Patient presents with  . Dysuria  . Hematuria      HPI Comments: At 2am today patient developed urgency, nocturia, mild hematuria, and lower abdominal pressure sensation.  No fevers, chills, and sweats.  No recent antibiotic use  Patient is a 74 y.o. female presenting with dysuria. The history is provided by the patient.  Dysuria Pain quality:  Burning Pain severity:  Mild Onset quality:  Sudden Duration:  1 day Timing:  Constant Progression:  Worsening Chronicity:  New Recent urinary tract infections: no   Relieved by:  None tried Worsened by:  Nothing tried Ineffective treatments:  None tried Urinary symptoms: discolored urine, frequent urination, hematuria and hesitancy   Urinary symptoms: no foul-smelling urine and no bladder incontinence   Associated symptoms: no abdominal pain, no fever, no flank pain, no genital lesions, no nausea, no vaginal discharge and no vomiting     Past Medical History  Diagnosis Date  . Hyperlipidemia   . Anxiety    Past Surgical History  Procedure Laterality Date  . Appendectomy    . Hand surgery Bilateral   . Abdominal hysterectomy    . Back surgery    . Cholecystectomy    . Knee surgery    . Eye surgery     History reviewed. No pertinent family history. History  Substance Use Topics  . Smoking status: Never Smoker   . Smokeless tobacco: Not on file  . Alcohol Use: No   OB History    No data available     Review of Systems  Constitutional: Negative for fever.  Gastrointestinal: Negative for nausea, vomiting and abdominal pain.  Genitourinary: Positive for dysuria. Negative for flank pain and vaginal discharge.  All other systems reviewed and are negative.   Allergies  Hydrocodone-acetaminophen; Lyrica; and Oxycodone-acetaminophen  Home Medications   Prior to Admission medications    Medication Sig Start Date End Date Taking? Authorizing Provider  Melatonin 5 MG TABS Take by mouth.   Yes Historical Provider, MD  ranitidine (ZANTAC) 150 MG tablet Take 150 mg by mouth 2 (two) times daily.   Yes Historical Provider, MD  cephALEXin (KEFLEX) 500 MG capsule Take 1 capsule (500 mg total) by mouth 2 (two) times daily. 06/04/15   Kandra Nicolas, MD   BP 104/68 mmHg  Pulse 78  Temp(Src) 97.3 F (36.3 C) (Oral)  Resp 16  Ht 5\' 2"  (1.575 m)  Wt 175 lb (79.379 kg)  BMI 32.00 kg/m2  SpO2 100% Physical Exam Nursing notes and Vital Signs reviewed. Appearance:  Patient appears stated age, and in no acute distress Eyes:  Pupils are equal, round, and reactive to light and accomodation.  Extraocular movement is intact.  Conjunctivae are not inflamed  Pharynx:  Normal Neck:  Supple.  No adenopathy Lungs:  Clear to auscultation.  Breath sounds are equal.  Heart:  Regular rate and rhythm without murmurs, rubs, or gallops.  Abdomen:  Nontender without masses or hepatosplenomegaly.  Bowel sounds are present.  No CVA or flank tenderness.  Extremities:  No edema.   Skin:  No rash present.   ED Course  Procedures  None    Labs Reviewed  URINE CULTURE  POCT URINALYSIS DIP (MANUAL ENTRY):  KET trace; BLO moderate; PRO 30mg /dL; NIT positive; LEU large  MDM   1. Cystitis    Urine culture pending.  Begin Keflex 500mg  BID for one week. Increase fluid intake. May use non-prescription AZO for about two days, if desired, to decrease urinary discomfort.  If symptoms become significantly worse during the night or over the weekend, proceed to the local emergency room.  Followup with Family Doctor if not improved in one week.     Kandra Nicolas, MD 06/04/15 [phone]

## 2015-06-07 LAB — URINE CULTURE: Colony Count: >=100000

## 2015-06-08 ENCOUNTER — Telehealth: Payer: Self-pay | Admitting: Emergency Medicine

## 2015-06-15 ENCOUNTER — Other Ambulatory Visit: Payer: Self-pay | Admitting: Family Medicine

## 2015-06-15 DIAGNOSIS — R102 Pelvic and perineal pain: Secondary | ICD-10-CM

## 2015-06-20 ENCOUNTER — Ambulatory Visit
Admission: RE | Admit: 2015-06-20 | Discharge: 2015-06-20 | Disposition: A | Discharge status: Home / Self Care | Payer: Medicare Other | Source: Ambulatory Visit | Attending: Family Medicine | Admitting: Family Medicine

## 2015-06-20 DIAGNOSIS — Z90710 Acquired absence of both cervix and uterus: Secondary | ICD-10-CM | POA: Diagnosis not present

## 2015-06-20 DIAGNOSIS — R102 Pelvic and perineal pain: Secondary | ICD-10-CM | POA: Diagnosis not present

## 2015-06-20 DIAGNOSIS — Z90722 Acquired absence of ovaries, bilateral: Secondary | ICD-10-CM | POA: Diagnosis not present

## 2015-11-07 ENCOUNTER — Other Ambulatory Visit: Payer: Self-pay | Admitting: Family Medicine

## 2015-11-07 DIAGNOSIS — Z1231 Encounter for screening mammogram for malignant neoplasm of breast: Secondary | ICD-10-CM

## 2015-11-08 ENCOUNTER — Encounter: Payer: Self-pay | Admitting: Emergency Medicine

## 2015-11-08 ENCOUNTER — Emergency Department: Payer: Medicare Other

## 2015-11-08 ENCOUNTER — Emergency Department
Admission: EM | Admit: 2015-11-08 | Discharge: 2015-11-08 | Disposition: A | Discharge status: Home / Self Care | Payer: Medicare Other | Attending: Emergency Medicine | Admitting: Emergency Medicine

## 2015-11-08 DIAGNOSIS — Z792 Long term (current) use of antibiotics: Secondary | ICD-10-CM | POA: Insufficient documentation

## 2015-11-08 DIAGNOSIS — S39012A Strain of muscle, fascia and tendon of lower back, initial encounter: Secondary | ICD-10-CM | POA: Diagnosis not present

## 2015-11-08 DIAGNOSIS — Y9314 Activity, water aerobics and water exercise: Secondary | ICD-10-CM | POA: Insufficient documentation

## 2015-11-08 DIAGNOSIS — Z79899 Other long term (current) drug therapy: Secondary | ICD-10-CM | POA: Diagnosis not present

## 2015-11-08 DIAGNOSIS — X58XXXA Exposure to other specified factors, initial encounter: Secondary | ICD-10-CM | POA: Insufficient documentation

## 2015-11-08 DIAGNOSIS — Y998 Other external cause status: Secondary | ICD-10-CM | POA: Insufficient documentation

## 2015-11-08 DIAGNOSIS — S3992XA Unspecified injury of lower back, initial encounter: Secondary | ICD-10-CM | POA: Diagnosis present

## 2015-11-08 DIAGNOSIS — Y9289 Other specified places as the place of occurrence of the external cause: Secondary | ICD-10-CM | POA: Diagnosis not present

## 2015-11-08 DIAGNOSIS — M461 Sacroiliitis, not elsewhere classified: Secondary | ICD-10-CM

## 2015-11-08 MED ORDER — PREDNISONE 20 MG PO TABS
60.0000 mg | ORAL_TABLET | Freq: Once | ORAL | Status: AC
  Administered 2015-11-08: 60 mg via ORAL
  Filled 2015-11-08: qty 3

## 2015-11-08 MED ORDER — HYDROCODONE-ACETAMINOPHEN 5-325 MG PO TABS: 1.0000 | ORAL_TABLET | ORAL | Status: DC | PRN

## 2015-11-08 MED ORDER — HYDROCODONE-ACETAMINOPHEN 5-325 MG PO TABS
2.0000 | ORAL_TABLET | Freq: Once | ORAL | Status: AC
  Administered 2015-11-08: 2 via ORAL
  Filled 2015-11-08: qty 2

## 2015-11-08 MED ORDER — PREDNISONE 20 MG PO TABS: 40.0000 mg | ORAL_TABLET | Freq: Every day | ORAL | Status: DC

## 2015-11-08 MED ORDER — ONDANSETRON 4 MG PO TBDP
4.0000 mg | ORAL_TABLET | Freq: Once | ORAL | Status: AC
  Administered 2015-11-08: 4 mg via ORAL
  Filled 2015-11-08: qty 1

## 2015-11-08 NOTE — ED Notes (Signed)
Pt reports right lower back pain since Oct 17th. Pt reports pain has increased since then. Pt reports pain to walk.

## 2015-11-08 NOTE — Discharge Instructions (Signed)
Back Injury Prevention  Back injuries can be very painful. They can also be difficult to heal. After having one back injury, you are more likely to injure your back again. It is important to learn how to avoid injuring or re-injuring your back. The following tips can help you to prevent a back injury.  WHAT SHOULD I KNOW ABOUT PHYSICAL FITNESS?  · Exercise for 30 minutes per day on most days of the week or as told by your doctor. Make sure to:  ¨ Do aerobic exercises, such as walking, jogging, biking, or swimming.  ¨ Do exercises that increase balance and strength, such as tai chi and yoga.  ¨ Do stretching exercises. This helps with flexibility.  ¨ Try to develop strong belly (abdominal) muscles. Your belly muscles help to support your back.  · Stay at a healthy weight. This helps to decrease your risk of a back injury.  WHAT SHOULD I KNOW ABOUT MY DIET?  · Talk with your doctor about your overall diet. Take supplements and vitamins only as told by your doctor.  · Talk with your doctor about how much calcium and vitamin D you need each day. These nutrients help to prevent weakening of the bones (osteoporosis).  · Include good sources of calcium in your diet, such as:    Dairy products.    Green leafy vegetables.    Products that have had calcium added to them (fortified).  · Include good sources of vitamin D in your diet, such as:    Milk.    Foods that have had vitamin D added to them.  WHAT SHOULD I KNOW ABOUT MY POSTURE?  · Sit up straight and stand up straight. Avoid leaning forward when you sit or hunching over when you stand.  · Choose chairs that have good low-back (lumbar) support.  · If you work at a desk, sit close to it so you do not need to lean over. Keep your chin tucked in. Keep your neck drawn back. Keep your elbows bent so your arms look like the letter "L" (right angle).  · Sit high and close to the steering wheel when you drive. Add a low-back support to your car seat, if needed.  · Avoid sitting  or standing in one position for very long. Take breaks to get up, stretch, and walk around at least one time every hour. Take breaks every hour if you are driving for long periods of time.  · Sleep on your side with your knees slightly bent, or sleep on your back with a pillow under your knees. Do not lie on the front of your body to sleep.  WHAT SHOULD I KNOW ABOUT LIFTING, TWISTING, AND REACHING  Lifting and Heavy Lifting   · Avoid heavy lifting, especially lifting over and over again. If you must do heavy lifting:    Stretch before lifting.    Work slowly.    Rest between lifts.    Use a tool such as a cart or a dolly to move objects if one is available.    Make several small trips instead of carrying one heavy load.    Ask for help when you need it, especially when moving big objects.  · Follow these steps when lifting:    Stand with your feet shoulder-width apart.    Get as close to the object as you can. Do not pick up a heavy object that is far from your body.    Use handles or lifting   as close to the center of your body as possible.  Follow these steps when putting down a heavy load:  Stand with your feet shoulder-width apart.  Lower the object slowly while you tighten the muscles in your legs, belly, and butt. Keep the object as close to the center of your body as possible.  Keep your shoulders back. Keep your chin tucked in. Keep your back straight.  Bend at your knees. Squat down, but keep your heels off the floor.  Use handles or lifting straps if they are available. Twisting and Reaching  Avoid lifting heavy objects above your waist.  Do not twist at your waist while you are lifting or carrying a load. If  you need to turn, move your feet.  Do not bend over without bending at your knees.  Avoid reaching over your head, across a table, or for an object on a high surface.  WHAT ARE SOME OTHER TIPS?  Avoid wet floors and icy ground. Keep sidewalks clear of ice to prevent falls.   Do not sleep on a mattress that is too soft or too hard.   Keep items that you use often within easy reach.   Put heavier objects on shelves at waist level, and put lighter objects on lower or higher shelves.  Find ways to lower your stress, such as:  Exercise.  Massage.  Relaxation techniques.  Talk with your doctor if you feel anxious or depressed. These conditions can make back pain worse.  Wear flat heel shoes with cushioned soles.  Avoid making quick (sudden) movements.  Use both shoulder straps when carrying a backpack.  Do not use any tobacco products, including cigarettes, chewing tobacco, or electronic cigarettes. If you need help quitting, ask your doctor.   This information is not intended to replace advice given to you by your health care provider. Make sure you discuss any questions you have with your health care provider.   Document Released: 05/27/2008 Document Revised: 04/25/2015 Document Reviewed: 12/13/2014 Elsevier Interactive Patient Education [employer].

## 2015-11-08 NOTE — ED Notes (Signed)
Pt in with worsening lower back pain since October; pt states Tramadol is no longer helping with pain. Pt reports difficulty walking.  Pt A/Ox4, no acute distress at this time.

## 2015-11-08 NOTE — ED Provider Notes (Signed)
[facility] Emergency Department Provider Note  Time seen: 4:10 PM  I have reviewed the triage vital signs and the nursing notes.   HISTORY  Chief Complaint Back Pain    HPI Jodi [name] is a 74 y.o. female with a past medical history of hyperlipidemia, anxiety, presents the emergency department with lower back pain. According to the patient she was on a cruise approximately 4 weeks ago when she was attempting to do water aerobics and she hurt her back. She states the back has been hurting ever since however over the past few days it has acutely worsened. She states worse whenever she turns, bends, or stands for prolonged time. Denies any pain radiating down her leg. Denies any incontinence. Denies any weakness or numbness of lower extremities. Patient states a history of back pain in the past including surgery for sciatica, however this was of the left side. Patient's pain is mostly in the lower back and off to the right paraspinal area.Describes her back pain as moderate, severe she moves. Denies abdominal pain.     Past Medical History  Diagnosis Date  . Hyperlipidemia   . Anxiety     Patient Active Problem List   Diagnosis Date Noted  . DIVERTICULITIS, COLON 08/15/2009  . ACUTE AND CHRONIC CHOLECYSTITIS 08/14/2009  . LIVER FUNCTION TESTS, ABNORMAL, HX OF 08/14/2009  . ABDOMINAL PAIN, RIGHT UPPER QUADRANT, HX OF 08/14/2009    Past Surgical History  Procedure Laterality Date  . Appendectomy    . Hand surgery Bilateral   . Abdominal hysterectomy    . Back surgery    . Cholecystectomy    . Knee surgery    . Eye surgery      Current Outpatient Rx  Name  Route  Sig  Dispense  Refill  . cephALEXin (KEFLEX) 500 MG capsule   Oral   Take 1 capsule (500 mg total) by mouth 2 (two) times daily.   14 capsule   0   . Melatonin 5 MG TABS   Oral   Take by mouth.         . ranitidine (ZANTAC) 150 MG tablet   Oral   Take 150 mg by mouth 2 (two)  times daily.           Allergies Hydrocodone-acetaminophen; Lyrica; and Oxycodone-acetaminophen  No family history on file.  Social History Social History  Substance Use Topics  . Smoking status: Never Smoker   . Smokeless tobacco: None  . Alcohol Use: No    Review of Systems Constitutional: Negative for fever. Cardiovascular: Negative for chest pain. Respiratory: Negative for shortness of breath. Gastrointestinal: Negative for abdominal pain Musculoskeletal: Positive lower back pain. Neurological: Negative for headaches, focal weakness or numbness. 10-point ROS otherwise negative.  ____________________________________________   PHYSICAL EXAM:  VITAL SIGNS: ED Triage Vitals  Enc Vitals Group     BP 11/08/15 1441 128/68 mmHg     Pulse Rate 11/08/15 1441 72     Resp 11/08/15 1441 16     Temp 11/08/15 1441 98.5 F (36.9 C)     Temp Source 11/08/15 1441 Oral     SpO2 11/08/15 1441 96 %     Weight 11/08/15 1441 180 lb (81.647 kg)     Height 11/08/15 1441 5\' 3"  (1.6 m)     Head Cir --      Peak Flow --      Pain Score 11/08/15 1442 7     Pain Loc --  Pain Edu? --      Excl. in [place]? --     Constitutional: Alert and oriented. Well appearing and in no distress. Eyes: Normal exam ENT   Head: Normocephalic and atraumatic.   Mouth/Throat: Mucous membranes are moist. Cardiovascular: Normal rate, regular rhythm. No murmur Respiratory: Normal respiratory effort without tachypnea nor retractions. Breath sounds are clear and equal bilaterally. No wheezes/rales/rhonchi. Gastrointestinal: Soft and nontender. No distention.  Musculoskeletal: Normal range of motion in her extremities including lower extremities. Moderate low lumbar tenderness palpation, with moderate right SI joint tenderness to palpation. Neurologic:  Normal speech and language. No gross focal neurologic deficits are appreciated. Speech is normal. Equal sensation bilateral lower extremities. Skin:   Skin is warm, dry and intact.  Psychiatric: Mood and affect are normal. Speech and behavior are normal  ____________________________________________     RADIOLOGY  X-ray shows no acute abnormality  ____________________________________________    INITIAL IMPRESSION / ASSESSMENT AND PLAN / ED COURSE  Pertinent labs & imaging results that were available during my care of the patient were reviewed by me and considered in my medical decision making (see chart for details).  Patient presents with lumbar pain, x-ray negative. Reproducible pain over the lower lumbar spine as well as the right SI joint. Much worse with movement especially twisting per patient. We will treat with pain medication and a short course of steroids. Patient is to follow-up with orthopedics for further evaluation and treatment. Patient agreeable to plan.  ____________________________________________   FINAL CLINICAL IMPRESSION(S) / ED DIAGNOSES  Lumbar pain   Harvest Dark, MD 11/08/15 [phone]

## 2015-11-21 ENCOUNTER — Other Ambulatory Visit: Payer: Self-pay | Admitting: Family Medicine

## 2015-11-21 ENCOUNTER — Ambulatory Visit
Admission: RE | Admit: 2015-11-21 | Discharge: 2015-11-21 | Disposition: A | Discharge status: Home / Self Care | Payer: Medicare Other | Source: Ambulatory Visit | Attending: Family Medicine | Admitting: Family Medicine

## 2015-11-21 DIAGNOSIS — Z1239 Encounter for other screening for malignant neoplasm of breast: Secondary | ICD-10-CM

## 2015-11-21 DIAGNOSIS — Z1231 Encounter for screening mammogram for malignant neoplasm of breast: Secondary | ICD-10-CM | POA: Insufficient documentation

## 2016-02-20 ENCOUNTER — Emergency Department: Payer: Medicare Other

## 2016-02-20 ENCOUNTER — Emergency Department
Admission: EM | Admit: 2016-02-20 | Discharge: 2016-02-20 | Disposition: A | Discharge status: Home / Self Care | Payer: Medicare Other | Attending: Emergency Medicine | Admitting: Emergency Medicine

## 2016-02-20 ENCOUNTER — Encounter: Payer: Self-pay | Admitting: Medical Oncology

## 2016-02-20 DIAGNOSIS — F419 Anxiety disorder, unspecified: Secondary | ICD-10-CM | POA: Insufficient documentation

## 2016-02-20 DIAGNOSIS — Z792 Long term (current) use of antibiotics: Secondary | ICD-10-CM | POA: Insufficient documentation

## 2016-02-20 DIAGNOSIS — J069 Acute upper respiratory infection, unspecified: Secondary | ICD-10-CM | POA: Diagnosis not present

## 2016-02-20 DIAGNOSIS — B9789 Other viral agents as the cause of diseases classified elsewhere: Secondary | ICD-10-CM

## 2016-02-20 DIAGNOSIS — M1611 Unilateral primary osteoarthritis, right hip: Secondary | ICD-10-CM | POA: Insufficient documentation

## 2016-02-20 DIAGNOSIS — Z79899 Other long term (current) drug therapy: Secondary | ICD-10-CM | POA: Insufficient documentation

## 2016-02-20 DIAGNOSIS — M25551 Pain in right hip: Secondary | ICD-10-CM | POA: Diagnosis present

## 2016-02-20 DIAGNOSIS — Z7952 Long term (current) use of systemic steroids: Secondary | ICD-10-CM | POA: Diagnosis not present

## 2016-02-20 DIAGNOSIS — E785 Hyperlipidemia, unspecified: Secondary | ICD-10-CM | POA: Insufficient documentation

## 2016-02-20 DIAGNOSIS — J988 Other specified respiratory disorders: Secondary | ICD-10-CM

## 2016-02-20 LAB — COMPREHENSIVE METABOLIC PANEL
ALT: 21 U/L (ref 14–54)
ANION GAP: 9 (ref 5–15)
AST: 23 U/L (ref 15–41)
Albumin: 3.9 g/dL (ref 3.5–5.0)
Alkaline Phosphatase: 66 U/L (ref 38–126)
BILIRUBIN TOTAL: 0.5 mg/dL (ref 0.3–1.2)
BUN: 17 mg/dL (ref 6–20)
CALCIUM: 9.2 mg/dL (ref 8.9–10.3)
CHLORIDE: 104 mmol/L (ref 101–111)
CO2: 26 mmol/L (ref 22–32)
CREATININE: 0.99 mg/dL (ref 0.44–1.00)
GFR, EST NON AFRICAN AMERICAN: 54 mL/min — AB (ref >60)
GLUCOSE: 127 mg/dL — AB (ref 65–99)
POTASSIUM: 4.2 mmol/L (ref 3.5–5.1)
Sodium: 139 mmol/L (ref 135–145)
TOTAL PROTEIN: 7.1 g/dL (ref 6.5–8.1)

## 2016-02-20 LAB — CBC WITH DIFFERENTIAL/PLATELET
BASOS ABS: 0 10*3/uL (ref 0–0.1)
Basophils Relative: 0 %
EOS ABS: 0.3 10*3/uL (ref 0–0.7)
EOS PCT: 4 %
HCT: 39.1 % (ref 35.0–47.0)
Hemoglobin: 13.4 g/dL (ref 12.0–16.0)
LYMPHS PCT: 13 %
Lymphs Abs: 1.2 10*3/uL (ref 1.0–3.6)
MCH: 31.2 pg (ref 26.0–34.0)
MCHC: 34.1 g/dL (ref 32.0–36.0)
MCV: 91.4 fL (ref 80.0–100.0)
MONO ABS: 0.7 10*3/uL (ref 0.2–0.9)
Monocytes Relative: 7 %
Neutro Abs: 7.3 10*3/uL — ABNORMAL HIGH (ref 1.4–6.5)
Neutrophils Relative %: 76 %
PLATELETS: 222 10*3/uL (ref 150–440)
RBC: 4.28 MIL/uL (ref 3.80–5.20)
RDW: 12.8 % (ref 11.5–14.5)
WBC: 9.6 10*3/uL (ref 3.6–11.0)

## 2016-02-20 LAB — RAPID INFLUENZA A&B ANTIGENS
Influenza A (ARMC): NOT DETECTED — AB
Influenza B (ARMC): NOT DETECTED — AB

## 2016-02-20 MED ORDER — HYDROMORPHONE HCL 1 MG/ML IJ SOLN
INTRAMUSCULAR | Status: AC
  Administered 2016-02-20: 0.5 mg via INTRAMUSCULAR
  Filled 2016-02-20: qty 1

## 2016-02-20 MED ORDER — PROMETHAZINE-DM 6.25-15 MG/5ML PO SYRP
5.0000 mL | ORAL_SOLUTION | Freq: Four times a day (QID) | ORAL | Status: DC | PRN
Start: 2016-02-20 — End: 2022-06-20

## 2016-02-20 MED ORDER — TRAMADOL HCL 50 MG PO TABS: 50.0000 mg | ORAL_TABLET | Freq: Two times a day (BID) | ORAL | Status: AC | PRN

## 2016-02-20 MED ORDER — HYDROMORPHONE HCL 1 MG/ML IJ SOLN
0.5000 mg | Freq: Once | INTRAMUSCULAR | Status: AC
  Administered 2016-02-20: 0.5 mg via INTRAMUSCULAR

## 2016-02-20 MED ORDER — HYDROMORPHONE HCL 1 MG/ML IJ SOLN
0.5000 mg | Freq: Once | INTRAMUSCULAR | Status: AC
  Administered 2016-02-20: 0.5 mg via INTRAMUSCULAR
  Filled 2016-02-20: qty 1

## 2016-02-20 NOTE — ED Notes (Signed)
Pt reports 2 days of generalized fatigue, "feeling like death" and hip pain. Pt in no acute distress at this time

## 2016-02-20 NOTE — ED Provider Notes (Signed)
[name] Provider Note  ____________________________________________  Time seen: Approximately 8:10 PM  I have reviewed the triage vital signs and the nursing notes.   HISTORY  Chief Complaint Hip Pain and Generalized Body Aches    HPI TOCCORA Jodi [name] is a 75 y.o. female patient complain of generalized fatigue. Patient complaining of right hip pain. Patient states she*having a fever last night. Patient state her hip was involved her for over a month. Patient stated no palliative measures for her complaints. Patient is rating her pain as 8/10. Patient denies any nausea vomiting diarrhea. Patient stated there is mild cough and rhinorrhea.   Past Medical History  Diagnosis Date  . Hyperlipidemia   . Anxiety     Patient Active Problem List   Diagnosis Date Noted  . DIVERTICULITIS, COLON 08/15/2009  . ACUTE AND CHRONIC CHOLECYSTITIS 08/14/2009  . LIVER FUNCTION TESTS, ABNORMAL, HX OF 08/14/2009  . ABDOMINAL PAIN, RIGHT UPPER QUADRANT, HX OF 08/14/2009    Past Surgical History  Procedure Laterality Date  . Appendectomy    . Hand surgery Bilateral   . Abdominal hysterectomy    . Back surgery    . Cholecystectomy    . Knee surgery    . Eye surgery      Current Outpatient Rx  Name  Route  Sig  Dispense  Refill  . cephALEXin (KEFLEX) 500 MG capsule   Oral   Take 1 capsule (500 mg total) by mouth 2 (two) times daily.   14 capsule   0   . HYDROcodone-acetaminophen (NORCO/VICODIN) 5-325 MG tablet   Oral   Take 1 tablet by mouth every 4 (four) hours as needed for moderate pain.   20 tablet   0   . Melatonin 5 MG TABS   Oral   Take by mouth.         . predniSONE (DELTASONE) 20 MG tablet   Oral   Take 2 tablets (40 mg total) by mouth daily.   6 tablet   0   . promethazine-dextromethorphan (PROMETHAZINE-DM) 6.25-15 MG/5ML syrup   Oral   Take 5 mLs by mouth 4 (four) times daily as needed for cough.   118 mL   0    . ranitidine (ZANTAC) 150 MG tablet   Oral   Take 150 mg by mouth 2 (two) times daily.         . traMADol (ULTRAM) 50 MG tablet   Oral   Take 1 tablet (50 mg total) by mouth every 12 (twelve) hours as needed for moderate pain.   20 tablet   0     Allergies Lyrica and Oxycodone-acetaminophen  No family history on file.  Social History Social History  Substance Use Topics  . Smoking status: Never Smoker   . Smokeless tobacco: None  . Alcohol Use: No    Review of Systems Constitutional: Fever and body aches. Eyes: No visual changes. ENT: No sore throat. Cardiovascular: Denies chest pain. Respiratory: Denies shortness of breath. Gastrointestinal: No abdominal pain.  No nausea, no vomiting.  No diarrhea.  No constipation. Genitourinary: Negative for dysuria. Musculoskeletal: Right hip pain Skin: Negative for rash. Neurological: Negative for headaches, focal weakness or numbness. Psychiatric:Anxiety Endocrine: Hyperlipidemia Hematological/Lymphatic: Allergic/Immunilogical: Lyrica and ask total see medication list   ____________________________________________   PHYSICAL EXAM:  VITAL SIGNS: ED Triage Vitals  Enc Vitals Group     BP 02/20/16 1702 124/75 mmHg     Pulse Rate 02/20/16 1702 96  Resp 02/20/16 1702 18     Temp 02/20/16 1702 100.8 F (38.2 C)     Temp Source 02/20/16 1702 Oral     SpO2 02/20/16 1702 97 %     Weight 02/20/16 1702 180 lb (81.647 kg)     Height 02/20/16 1702 5\' 4"  (1.626 m)     Head Cir --      Peak Flow --      Pain Score 02/20/16 1703 8     Pain Loc --      Pain Edu? --      Excl. in [place]? --     Constitutional: Alert and oriented. Well appearing and in no acute distress. Eyes: Conjunctivae are normal. PERRL. EOMI. Head: Atraumatic. Nose: No congestion/rhinnorhea. Mouth/Throat: Mucous membranes are moist.  Oropharynx non-erythematous. Neck: No stridor. No cervical spine tenderness to  palpation. Hematological/Lymphatic/Immunilogical: No cervical lymphadenopathy. Cardiovascular: Normal rate, regular rhythm. Grossly normal heart sounds.  Good peripheral circulation. Respiratory: Normal respiratory effort.  No retractions. Lungs CTAB. Gastrointestinal: Soft and nontender. No distention. No abdominal bruits. No CVA tenderness. Musculoskeletal: No obvious deformity of the leg length discrepancy. Patient's tender palpation of the greater trochanter area. Patient has limited weightbearing.  Neurologic:  Normal speech and language. No gross focal neurologic deficits are appreciated. No gait instability. Skin:  Skin is warm, dry and intact. No rash noted. Psychiatric: Mood and affect are normal. Speech and behavior are normal.  ____________________________________________   LABS (all labs ordered are listed, but only abnormal results are displayed)  Labs Reviewed  RAPID INFLUENZA A&B ANTIGENS ([facility] ONLY) - Abnormal; Notable for the following:    Influenza A [name]) NOT DETECTED (*)    Influenza B ([facility]) NOT DETECTED (*)    All other components within normal limits  COMPREHENSIVE METABOLIC PANEL - Abnormal; Notable for the following:    Glucose, Bld 127 (*)    GFR calc non Af Amer 54 (*)    All other components within normal limits  CBC WITH DIFFERENTIAL/PLATELET - Abnormal; Notable for the following:    Neutro Abs 7.3 (*)    All other components within normal limits   ____________________________________________  EKG   ____________________________________________  RADIOLOGY  No acute findings on x-ray of the right hip. Moderate to severe arthritis.  ____________________________________________   PROCEDURES  Procedure(s) performed: None  Critical Care performed: No  ____________________________________________   INITIAL IMPRESSION / ASSESSMENT AND PLAN / ED COURSE  Pertinent labs & imaging results that were available during my care of the patient were  reviewed by me and considered in my medical decision making (see chart for details).  Arthritis right hip and viral respiratory infection.. Patient given discharge care instructions. Patient given a prescription for promethazine DM and tramadol. Patient advised follow-up family doctor for continued care. ____________________________________________   FINAL CLINICAL IMPRESSION(S) / ED DIAGNOSES  Final diagnoses:  Viral respiratory illness  Arthritis of right hip      Sable Feil, PA-C 02/20/16 2050  Earleen Newport, MD 02/20/16 2126

## 2016-02-20 NOTE — ED Notes (Signed)
Pt alert and oriented X4, active, cooperative, pt in NAD. RR even and unlabored, color WNL.  Pt informed to return if any life threatening symptoms occur.   

## 2016-02-20 NOTE — ED Notes (Signed)
Pt reports that she has been having body aches all over and mostly in rt hip and down both legs. Pt also reports she began running fever last night.

## 2016-02-20 NOTE — Discharge Instructions (Signed)
  Arthritis Arthritis means joint pain. It can also mean joint disease. A joint is a place where bones come together. People who have arthritis may have:  Red joints.  Swollen joints.  Stiff joints.  Warm joints.  A fever.  A feeling of being sick. HOME CARE Pay attention to any changes in your symptoms. Take these actions to help with your pain and swelling. Medicines  Take over-the-counter and prescription medicines only as told by your doctor.  Do not take aspirin for pain if your doctor says that you may have gout. Activities  Rest your joint if your doctor tells you to.  Avoid activities that make the pain worse.  Exercise your joint regularly as told by your doctor. Try doing exercises like:  Swimming.  Water aerobics.  Biking.  Walking. Joint Care  If your joint is swollen, keep it raised (elevated) if told by your doctor.  If your joint feels stiff in the morning, try taking a warm shower.  If you have diabetes, do not apply heat without asking your doctor.  If told, apply heat to the joint:  Put a towel between the joint and the hot pack or heating pad.  Leave the heat on the area for 20-30 minutes.  If told, apply ice to the joint:  Put ice in a plastic bag.  Place a towel between your skin and the bag.  Leave the ice on for 20 minutes, 2-3 times per day.  Keep all follow-up visits as told by your doctor. GET HELP IF:  The pain gets worse.  You have a fever. GET HELP RIGHT AWAY IF:  You have very bad pain in your joint.  You have swelling in your joint.  Your joint is red.  Many joints become painful and swollen.  You have very bad back pain.  Your leg is very weak.  You cannot control your pee (urine) or poop (stool).   This information is not intended to replace advice given to you by your health care provider. Make sure you discuss any questions you have with your health care provider.   Document Released: 03/05/2010  Document Revised: 08/30/2015 Document Reviewed: 03/06/2015 Elsevier Interactive Patient Education 2016 [employer]  

## 2016-12-10 ENCOUNTER — Other Ambulatory Visit: Payer: Self-pay | Admitting: Family Medicine

## 2016-12-10 DIAGNOSIS — Z1231 Encounter for screening mammogram for malignant neoplasm of breast: Secondary | ICD-10-CM

## 2017-01-13 ENCOUNTER — Ambulatory Visit
Admission: RE | Admit: 2017-01-13 | Discharge: 2017-01-13 | Disposition: A | Discharge status: Home / Self Care | Payer: Medicare Other | Source: Ambulatory Visit | Attending: Family Medicine | Admitting: Family Medicine

## 2017-01-13 DIAGNOSIS — Z1231 Encounter for screening mammogram for malignant neoplasm of breast: Secondary | ICD-10-CM | POA: Diagnosis present

## 2017-06-02 DIAGNOSIS — E039 Hypothyroidism, unspecified: Secondary | ICD-10-CM | POA: Insufficient documentation

## 2017-12-08 ENCOUNTER — Other Ambulatory Visit: Payer: Self-pay | Admitting: Family Medicine

## 2017-12-08 DIAGNOSIS — Z1231 Encounter for screening mammogram for malignant neoplasm of breast: Secondary | ICD-10-CM

## 2018-01-14 ENCOUNTER — Ambulatory Visit
Admission: RE | Admit: 2018-01-14 | Discharge: 2018-01-14 | Disposition: A | Discharge status: Home / Self Care | Payer: Medicare Other | Source: Ambulatory Visit | Attending: Family Medicine | Admitting: Family Medicine

## 2018-01-14 DIAGNOSIS — Z1231 Encounter for screening mammogram for malignant neoplasm of breast: Secondary | ICD-10-CM | POA: Insufficient documentation

## 2018-03-26 ENCOUNTER — Other Ambulatory Visit: Payer: Self-pay

## 2018-03-26 ENCOUNTER — Emergency Department
Admission: EM | Admit: 2018-03-26 | Discharge: 2018-03-26 | Disposition: A | Discharge status: Home / Self Care | Payer: Medicare Other | Attending: Emergency Medicine | Admitting: Emergency Medicine

## 2018-03-26 ENCOUNTER — Emergency Department: Payer: Medicare Other

## 2018-03-26 DIAGNOSIS — M79604 Pain in right leg: Secondary | ICD-10-CM | POA: Diagnosis not present

## 2018-03-26 DIAGNOSIS — M79605 Pain in left leg: Secondary | ICD-10-CM | POA: Insufficient documentation

## 2018-03-26 DIAGNOSIS — R52 Pain, unspecified: Secondary | ICD-10-CM

## 2018-03-26 LAB — CBC
HCT: 42.9 % (ref 35.0–47.0)
Hemoglobin: 14.3 g/dL (ref 12.0–16.0)
MCH: 31 pg (ref 26.0–34.0)
MCHC: 33.4 g/dL (ref 32.0–36.0)
MCV: 92.8 fL (ref 80.0–100.0)
PLATELETS: 272 10*3/uL (ref 150–440)
RBC: 4.62 MIL/uL (ref 3.80–5.20)
RDW: 13.3 % (ref 11.5–14.5)
WBC: 5.2 10*3/uL (ref 3.6–11.0)

## 2018-03-26 LAB — BASIC METABOLIC PANEL
Anion gap: 8 (ref 5–15)
BUN: 12 mg/dL (ref 6–20)
CALCIUM: 9.1 mg/dL (ref 8.9–10.3)
CHLORIDE: 103 mmol/L (ref 101–111)
CO2: 26 mmol/L (ref 22–32)
CREATININE: 1.02 mg/dL — AB (ref 0.44–1.00)
GFR calc non Af Amer: 51 mL/min — ABNORMAL LOW (ref >60)
GFR, EST AFRICAN AMERICAN: 59 mL/min — AB (ref >60)
Glucose, Bld: 104 mg/dL — ABNORMAL HIGH (ref 65–99)
Potassium: 4 mmol/L (ref 3.5–5.1)
SODIUM: 137 mmol/L (ref 135–145)

## 2018-03-26 LAB — TROPONIN I: Troponin I: <0.03 ng/mL (ref <0.03)

## 2018-03-26 MED ORDER — SODIUM CHLORIDE 0.9 % IV BOLUS
1000.0000 mL | Freq: Once | INTRAVENOUS | Status: AC
  Administered 2018-03-26: 1000 mL via INTRAVENOUS

## 2018-03-26 MED ORDER — ONDANSETRON HCL 4 MG/2ML IJ SOLN
INTRAMUSCULAR | Status: AC
  Filled 2018-03-26: qty 2

## 2018-03-26 MED ORDER — ONDANSETRON HCL 4 MG/2ML IJ SOLN
4.0000 mg | Freq: Once | INTRAMUSCULAR | Status: AC
  Administered 2018-03-26: 4 mg via INTRAVENOUS

## 2018-03-26 MED ORDER — DIAZEPAM 2 MG PO TABS: 2.0000 mg | ORAL_TABLET | Freq: Three times a day (TID) | ORAL | 0 refills | Status: AC | PRN

## 2018-03-26 NOTE — ED Notes (Signed)
Pt arrived to room with [place], Therapist, sports. Pt is alert but drowsy on arrival, pt also noted to be pale and diaphoretic.

## 2018-03-26 NOTE — ED Notes (Signed)
Pt crying at tourniquet being placed on arm. Starting an IV in triage just to prevent pt from being stuck again d/t crying.

## 2018-03-26 NOTE — ED Notes (Signed)
R Arm: 113/59 L Arm: 117/49 R Leg: 133/46 L leg: 117/69  While taking BP on legs, pt noted to be gritting teeth and yelling, however immediately upon release of BP cuff pt was noted to stop and facial expression returned to relaxed. MD made aware of patient reaction.

## 2018-03-26 NOTE — ED Triage Notes (Signed)
Pt brought over from [facility] for bilat leg pain that began yesterday. Pt sitting on her walker seat. Alert, oriented. Denies falling or being hit in legs. No pitting edema noted. Pt states x past few years legs have been sensitive to touch. No redness noted to legs. Pt describes pain as "if I've had a bad leg cramp". Pt states cough and low grade fever at home.

## 2018-03-26 NOTE — ED Notes (Signed)
Dr. Williams at bedside at this time.  

## 2018-03-26 NOTE — ED Triage Notes (Signed)
Pt in by [facility], reports there were several people in front of her an she was in pain so they brought her to the ED. [facility] staff reports pt with bilateral leg pain.

## 2018-03-26 NOTE — ED Provider Notes (Signed)
[facility] Emergency Department Provider Note       Time seen: ----------------------------------------- 1:13 PM on 03/26/2018 -----------------------------------------   I have reviewed the triage vital signs and the nursing notes.  HISTORY   Chief Complaint Leg Pain    HPI Jodi [name] is a 77 y.o. female with a history of anxiety, hyperlipidemia who presents to the ED for bilateral leg pain from the knee down.  She was brought from [facility] for same.  She was sitting on her walker seat to be evaluated for leg pain.  She denies any falls or trauma or having any edema.  Patient states the legs have been sensitive to touch but is not had any redness.  While she was in triage receiving IV she had a syncopal event.  She woke up and started spitting out clear sputum.  She is denying any complaints other than leg pain at this time.  Past Medical History:  Diagnosis Date  . Anxiety   . Hyperlipidemia     Patient Active Problem List   Diagnosis Date Noted  . DIVERTICULITIS, COLON 08/15/2009  . ACUTE AND CHRONIC CHOLECYSTITIS 08/14/2009  . LIVER FUNCTION TESTS, ABNORMAL, HX OF 08/14/2009  . ABDOMINAL PAIN, RIGHT UPPER QUADRANT, HX OF 08/14/2009    Past Surgical History:  Procedure Laterality Date  . ABDOMINAL HYSTERECTOMY    . APPENDECTOMY    . BACK SURGERY    . CHOLECYSTECTOMY    . EYE SURGERY    . HAND SURGERY Bilateral   . KNEE SURGERY      Allergies Lyrica [pregabalin] and Oxycodone-acetaminophen  Social History Social History   Tobacco Use  . Smoking status: Never Smoker  Substance Use Topics  . Alcohol use: No  . Drug use: No   Review of Systems Constitutional: Negative for fever. Cardiovascular: Negative for chest pain. Respiratory: Negative for shortness of breath. Gastrointestinal: Negative for abdominal pain, vomiting and diarrhea. Musculoskeletal: Positive for bilateral leg pain Skin: Negative for  rash. Neurological: Negative for headaches, focal weakness or numbness.  All systems negative/normal/unremarkable except as stated in the HPI  ____________________________________________   PHYSICAL EXAM:  VITAL SIGNS: ED Triage Vitals  Enc Vitals Group     BP 03/26/18 1252 (!) 106/54     Pulse Rate 03/26/18 1249 97     Resp 03/26/18 1249 20     Temp 03/26/18 1249 99.9 F (37.7 C)     Temp Source 03/26/18 1249 Oral     SpO2 03/26/18 1249 97 %     Weight 03/26/18 1250 155 lb (70.3 kg)     Height 03/26/18 1250 5\' 2"  (1.575 m)     Head Circumference --      Peak Flow --      Pain Score 03/26/18 1249 10     Pain Loc --      Pain Edu? --      Excl. in [place]? --    Constitutional: Drowsy, no distress Eyes: Conjunctivae are normal. Normal extraocular movements. Cardiovascular: Normal rate, regular rhythm. No murmurs, rubs, or gallops. Respiratory: Normal respiratory effort without tachypnea nor retractions. Breath sounds are clear and equal bilaterally. No wheezes/rales/rhonchi. Gastrointestinal: Soft and nontender. Normal bowel sounds Musculoskeletal: Nontender with normal range of motion in extremities. No lower extremity tenderness nor edema.  Legs are tender to touch but appeared normal, good peripheral pulses Neurologic:  Normal speech and language. No gross focal neurologic deficits are appreciated.  Skin:  Skin is warm, dry and  intact. No rash noted. Psychiatric: Mood and affect are normal. Speech and behavior are normal.  ____________________________________________  EKG: Interpreted by me.  Sinus rhythm rate 75 bpm, normal PR interval, normal QRS, normal QT.  ____________________________________________  ED COURSE:  As part of my medical decision making, I reviewed the following data within the [place] History obtained from family if available, nursing notes, old chart and ekg, as well as notes from prior ED visits. Patient presented for bilateral leg  pain, we will assess with labs and imaging as indicated at this time.   Procedures ____________________________________________   LABS (pertinent positives/negatives)  Labs Reviewed  BASIC METABOLIC PANEL - Abnormal; Notable for the following components:      Result Value   Glucose, Bld 104 (*)    Creatinine, Ser 1.02 (*)    GFR calc non Af Amer 51 (*)    GFR calc Af Amer 59 (*)    All other components within normal limits  CBC  TROPONIN I    RADIOLOGY Images were viewed by me  US venous Is unremarkable, negative for DVT ____________________________________________  DIFFERENTIAL DIAGNOSIS   Restless leg syndrome, neuropathy, electrolyte abnormality, dehydration, vasovagal syncope  FINAL ASSESSMENT AND PLAN  Leg pain, syncope   Plan: The patient had presented for leg pain and subsequently had a syncopal event during IV access while in triage. Patient's labs are unremarkable. Patient's imaging also unremarkable.  Her vascular status is indeed intact as well, normal ABIs in both legs.  I do not have a reason for her leg pain, I may prescribe some Valium that she can try for either restless legs or spasms.  She is cleared for outpatient follow-up, appears to have a syncopal event after having her blood drawn.   Laurence Aly, MD   Note: This note was generated in part or whole with voice recognition software. Voice recognition is usually quite accurate but there are transcription errors that can and very often do occur. I apologize for any typographical errors that were not detected and corrected.     Earleen Newport, MD 03/26/18 1444

## 2018-03-26 NOTE — ED Notes (Signed)
Pt returned from US at this time.

## 2018-03-26 NOTE — ED Notes (Signed)
Pt had syncopal episode after IV stick. Strong pulse the entire time. Woke up and started spitting out clear spit.

## 2018-12-31 IMAGING — US US EXTREM LOW VENOUS BILAT
1 series · 13 of 24 positions shown · non-contrast
Comparison: None.

CLINICAL DATA: Bilateral lower extremity pain and edema for the
past day. Evaluate for DVT.



[Series 1: us extrem low venous bilat · 0.07mm/px · 13 of 67 slices shown]
[im 1/67]
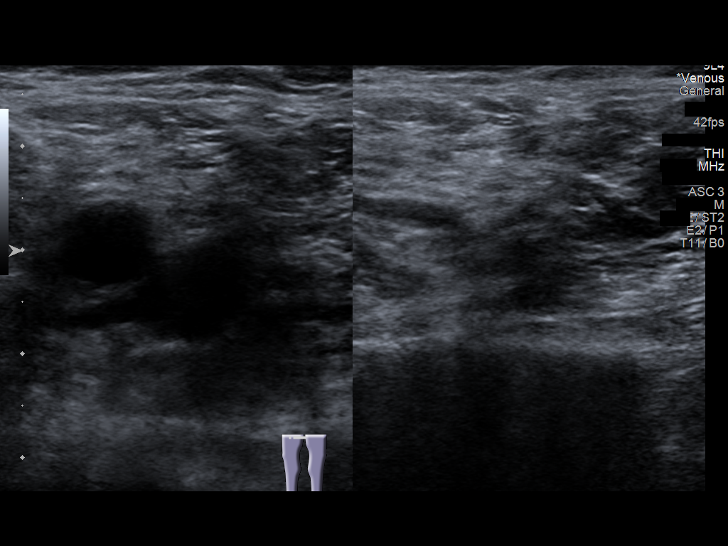
[im 6/67]
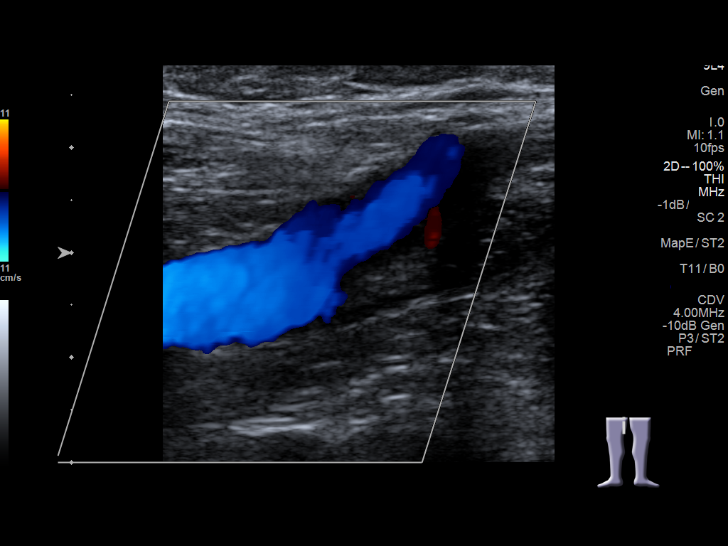
[im 12/67]
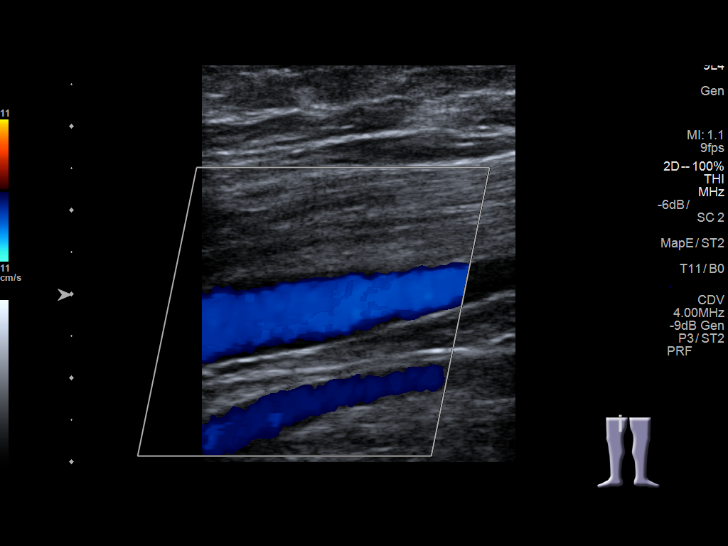
[im 18/67]
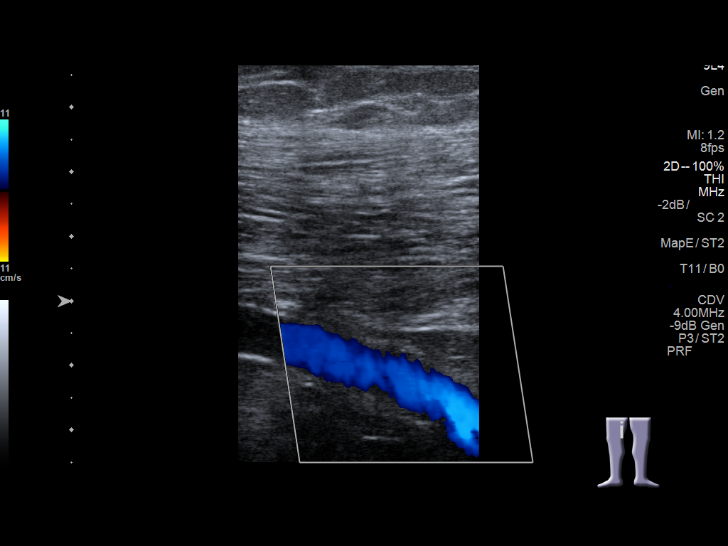
[im 23/67]
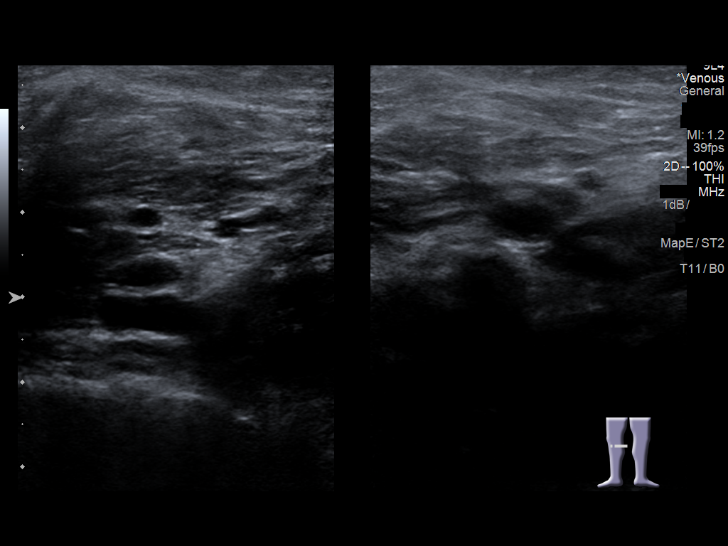
[im 29/67]
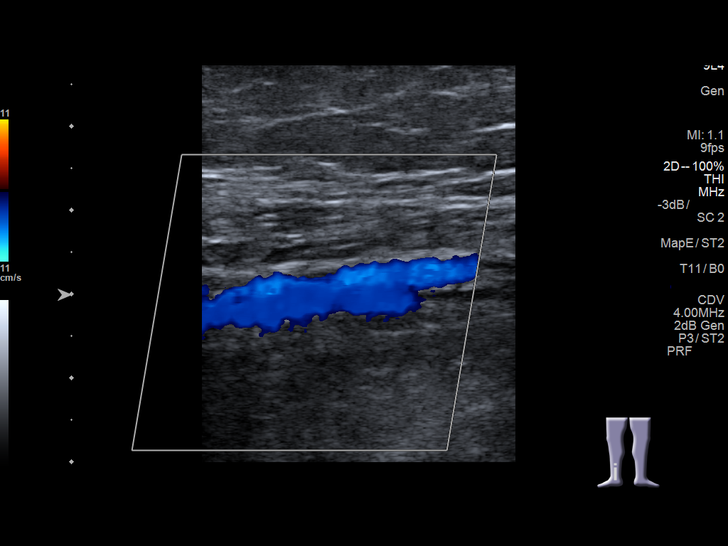
[im 35/67]
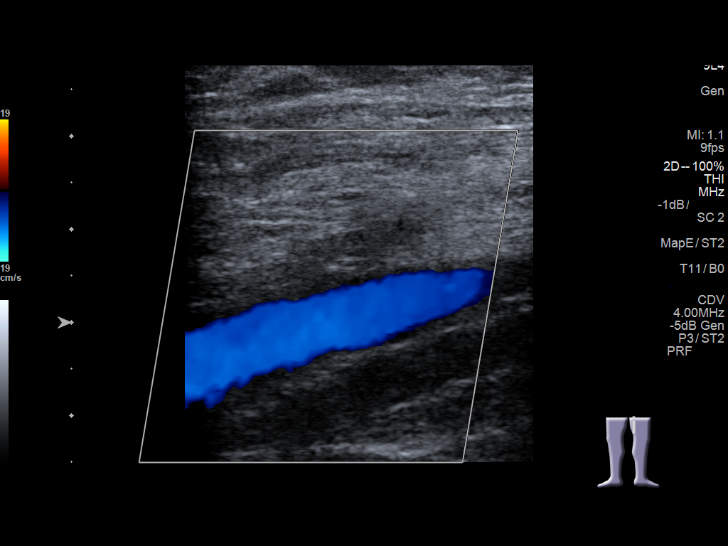
[im 38/67]
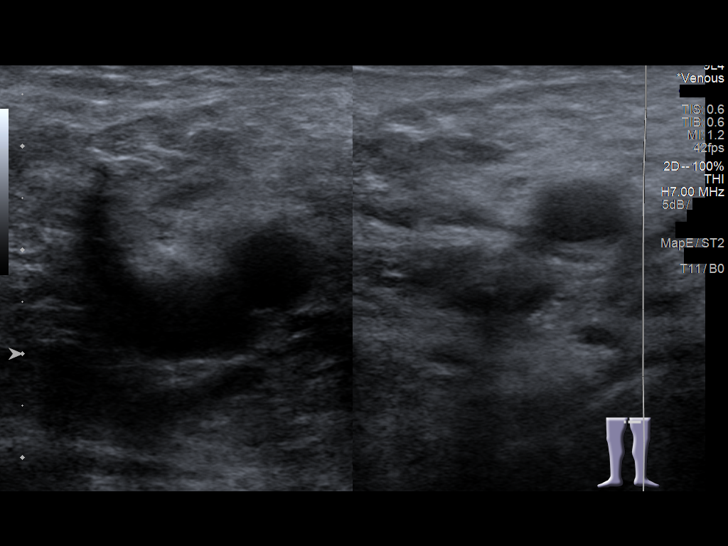
[im 44/67]
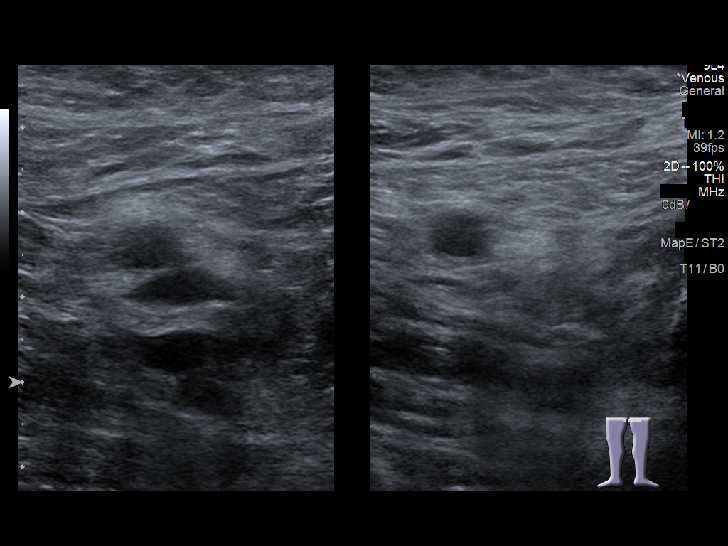
[im 49/67]
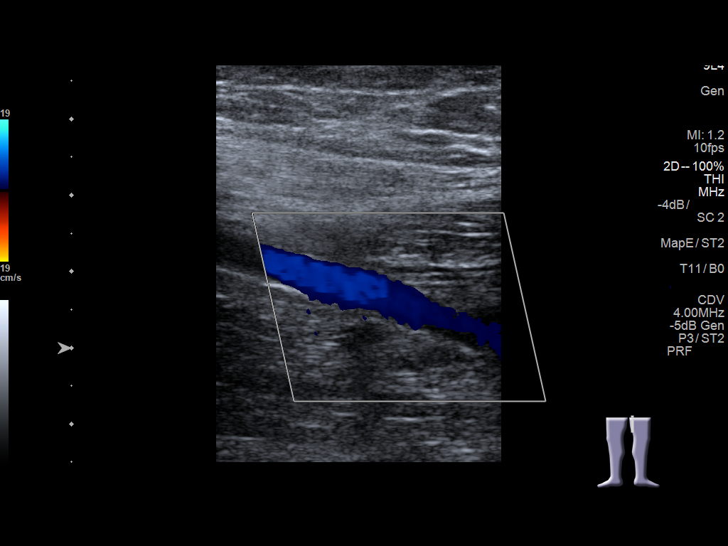
[im 55/67]
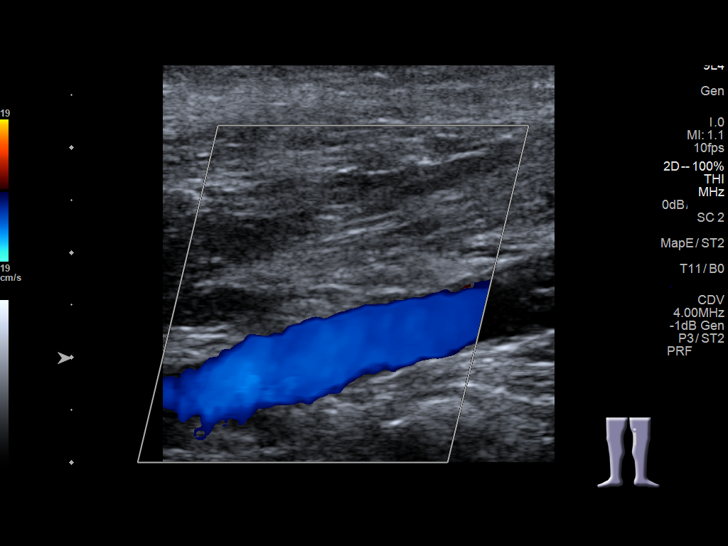
[im 61/67]
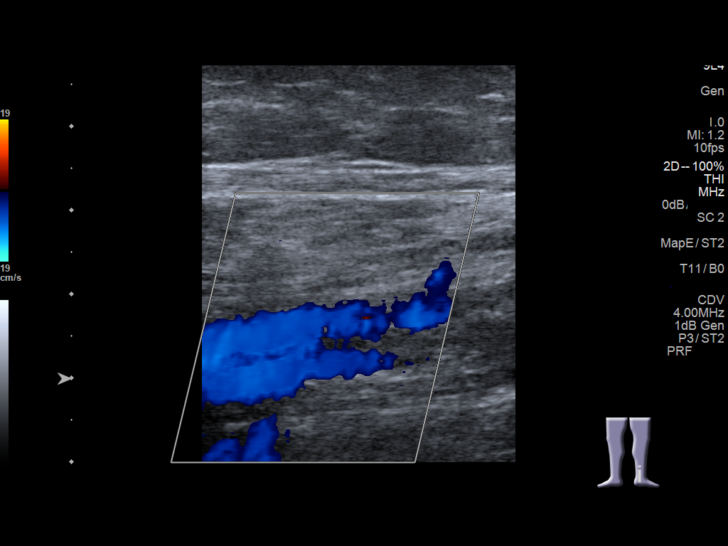
[im 67/67]
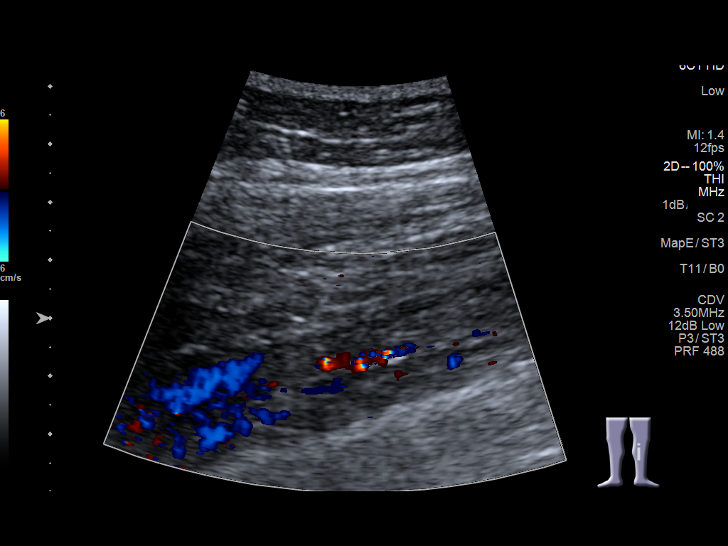

[13 of 24 positions shown; findings below may reference images not displayed]

FINDINGS: RIGHT LOWER EXTREMITY

Common Femoral Vein: No evidence of thrombus. Normal
compressibility, respiratory phasicity and response to augmentation.

Saphenofemoral Junction: No evidence of thrombus. Normal
compressibility and flow on color Doppler imaging.

Profunda Femoral Vein: No evidence of thrombus. Normal
compressibility and flow on color Doppler imaging.

Femoral Vein: No evidence of thrombus. Normal compressibility,
respiratory phasicity and response to augmentation.

Popliteal Vein: No evidence of thrombus. Normal compressibility,
respiratory phasicity and response to augmentation.

Calf Veins: No evidence of thrombus. Normal compressibility and flow
on color Doppler imaging.

Superficial Great Saphenous Vein: No evidence of thrombus. Normal
compressibility.

Venous Reflux:  None.

Other Findings:  None.

LEFT LOWER EXTREMITY

Common Femoral Vein: No evidence of thrombus. Normal
compressibility, respiratory phasicity and response to augmentation.

Saphenofemoral Junction: No evidence of thrombus. Normal
compressibility and flow on color Doppler imaging.

Profunda Femoral Vein: No evidence of thrombus. Normal
compressibility and flow on color Doppler imaging.

Femoral Vein: No evidence of thrombus. Normal compressibility,
respiratory phasicity and response to augmentation.

Popliteal Vein: No evidence of thrombus. Normal compressibility,
respiratory phasicity and response to augmentation.

Calf Veins: No evidence of thrombus. Normal compressibility and flow
on color Doppler imaging.

Superficial Great Saphenous Vein: No evidence of thrombus. Normal
compressibility.

Venous Reflux:  None.

Other Findings:  None.
IMPRESSION: No evidence of DVT within either lower extremity.

## 2019-07-27 ENCOUNTER — Emergency Department: Payer: Medicare Other

## 2019-07-27 ENCOUNTER — Emergency Department
Admission: EM | Admit: 2019-07-27 | Discharge: 2019-07-27 | Disposition: A | Discharge status: Home / Self Care | Payer: Medicare Other | Attending: Emergency Medicine | Admitting: Emergency Medicine

## 2019-07-27 ENCOUNTER — Other Ambulatory Visit: Payer: Self-pay

## 2019-07-27 DIAGNOSIS — N390 Urinary tract infection, site not specified: Secondary | ICD-10-CM | POA: Diagnosis not present

## 2019-07-27 DIAGNOSIS — R300 Dysuria: Secondary | ICD-10-CM | POA: Diagnosis present

## 2019-07-27 DIAGNOSIS — Z79899 Other long term (current) drug therapy: Secondary | ICD-10-CM | POA: Insufficient documentation

## 2019-07-27 DIAGNOSIS — R109 Unspecified abdominal pain: Secondary | ICD-10-CM | POA: Insufficient documentation

## 2019-07-27 DIAGNOSIS — Z85828 Personal history of other malignant neoplasm of skin: Secondary | ICD-10-CM | POA: Insufficient documentation

## 2019-07-27 HISTORY — DX: Unspecified malignant neoplasm of skin, unspecified: C44.90

## 2019-07-27 LAB — URINALYSIS, COMPLETE (UACMP) WITH MICROSCOPIC
Bacteria, UA: NONE SEEN
Bilirubin Urine: NEGATIVE
Glucose, UA: NEGATIVE mg/dL
Ketones, ur: NEGATIVE mg/dL
Nitrite: NEGATIVE
Protein, ur: 100 mg/dL — AB
RBC / HPF: >50 RBC/hpf — ABNORMAL HIGH (ref 0–5)
Specific Gravity, Urine: 1.009 (ref 1.005–1.030)
WBC, UA: >50 WBC/hpf — ABNORMAL HIGH (ref 0–5)
pH: 6 (ref 5.0–8.0)

## 2019-07-27 MED ORDER — SODIUM CHLORIDE 0.9 % IV SOLN
1.0000 g | Freq: Once | INTRAVENOUS | Status: AC
  Administered 2019-07-27: 1 g via INTRAVENOUS
  Filled 2019-07-27: qty 10

## 2019-07-27 MED ORDER — SODIUM CHLORIDE 0.9 % IV BOLUS
500.0000 mL | Freq: Once | INTRAVENOUS | Status: AC
  Administered 2019-07-27: 500 mL via INTRAVENOUS

## 2019-07-27 MED ORDER — CEPHALEXIN 500 MG PO CAPS: 500.0000 mg | ORAL_CAPSULE | Freq: Three times a day (TID) | ORAL | 0 refills | Status: AC

## 2019-07-27 MED ORDER — PHENAZOPYRIDINE HCL 200 MG PO TABS: 200.0000 mg | ORAL_TABLET | Freq: Three times a day (TID) | ORAL | 0 refills | Status: AC | PRN

## 2019-07-27 MED ORDER — PHENAZOPYRIDINE HCL 200 MG PO TABS: 200.0000 mg | ORAL_TABLET | Freq: Once | ORAL | Status: DC

## 2019-07-27 NOTE — ED Notes (Signed)
FIRST NURSE NOTE:  Pt here with husband states she thinks she has a UTI.

## 2019-07-27 NOTE — ED Triage Notes (Signed)
Pt to the er for dysuria and bladder spasms. Pt states she took a pain pill PTA. Pt is obviously in pain.

## 2019-07-27 NOTE — ED Provider Notes (Signed)
[facility] Emergency Department Provider Note  ____________________________________________   First MD Initiated Contact with Patient 07/27/19 2129     (approximate)  I have reviewed the triage vital signs and the nursing notes.   HISTORY  Chief Complaint Dysuria    HPI Jodi [name] is a 78 y.o. female presents to the emergency department complaining of dysuria and bladder spasms.  Symptoms for 2 to 3 days.  She took a pain pill and had vomiting afterwards.  She denies any known fever.  She states that the pain is at the bladder and urethra.    Past Medical History:  Diagnosis Date  . Anxiety   . Hyperlipidemia   . Skin cancer     Patient Active Problem List   Diagnosis Date Noted  . DIVERTICULITIS, COLON 08/15/2009  . ACUTE AND CHRONIC CHOLECYSTITIS 08/14/2009  . LIVER FUNCTION TESTS, ABNORMAL, HX OF 08/14/2009  . ABDOMINAL PAIN, RIGHT UPPER QUADRANT, HX OF 08/14/2009    Past Surgical History:  Procedure Laterality Date  . ABDOMINAL HYSTERECTOMY    . APPENDECTOMY    . BACK SURGERY    . CHOLECYSTECTOMY    . EYE SURGERY    . HAND SURGERY Bilateral   . KNEE SURGERY      Prior to Admission medications   Medication Sig Start Date End Date Taking? Authorizing Provider  cephALEXin (KEFLEX) 500 MG capsule Take 1 capsule (500 mg total) by mouth 3 (three) times daily for 7 days. 07/27/19 08/03/19  Fisher, Linden Dolin, PA-C  HYDROcodone-acetaminophen (NORCO/VICODIN) 5-325 MG tablet Take 1 tablet by mouth every 4 (four) hours as needed for moderate pain. Patient not taking: Reported on 03/26/2018 11/08/15   Harvest Dark, MD  levothyroxine (SYNTHROID, LEVOTHROID) 75 MCG tablet Take 1 tablet by mouth daily. 12/26/17   [provider]  Melatonin 5 MG TABS Take 1 tablet by mouth at bedtime.     [provider]  Multiple Vitamin (MULTIVITAMIN) capsule Take 1 capsule by mouth daily.    [provider]  phenazopyridine  (PYRIDIUM) 200 MG tablet Take 1 tablet (200 mg total) by mouth 3 (three) times daily as needed for pain. 07/27/19 07/26/20  Fisher, Linden Dolin, PA-C  predniSONE (DELTASONE) 20 MG tablet Take 2 tablets (40 mg total) by mouth daily. Patient not taking: Reported on 03/26/2018 11/08/15   Harvest Dark, MD  promethazine-dextromethorphan (PROMETHAZINE-DM) 6.25-15 MG/5ML syrup Take 5 mLs by mouth 4 (four) times daily as needed for cough. Patient not taking: Reported on 03/26/2018 02/20/16   Sable Feil, PA-C  ranitidine (ZANTAC) 150 MG tablet Take 150 mg by mouth 2 (two) times daily.    [provider]    Allergies Lyrica [pregabalin] and Oxycodone-acetaminophen  No family history on file.  Social History Social History   Tobacco Use  . Smoking status: Never Smoker  . Smokeless tobacco: Never Used  Substance Use Topics  . Alcohol use: No  . Drug use: No    Review of Systems  Constitutional: No fever/chills Eyes: No visual changes. ENT: No sore throat. Respiratory: Denies cough Genitourinary: Positive for dysuria. Musculoskeletal: Negative for back pain. Skin: Negative for rash.    ____________________________________________   PHYSICAL EXAM:  VITAL SIGNS: ED Triage Vitals  Enc Vitals Group     BP 07/27/19 2051 137/68     Pulse Rate 07/27/19 2051 90     Resp 07/27/19 2051 20     Temp 07/27/19 2051 98.4 F (36.9 C)  Temp Source 07/27/19 2051 Oral     SpO2 07/27/19 2051 97 %     Weight 07/27/19 2052 173 lb (78.5 kg)     Height 07/27/19 2052 5' 3.75" (1.619 m)     Head Circumference --      Peak Flow --      Pain Score 07/27/19 2052 7     Pain Loc --      Pain Edu? --      Excl. in [place]? --     Constitutional: Alert and oriented. Well appearing and in no acute distress. Eyes: Conjunctivae are normal.  Head: Atraumatic. Nose: No congestion/rhinnorhea. Mouth/Throat: Mucous membranes are moist.   Neck:  supple no lymphadenopathy noted Cardiovascular: Normal  rate, regular rhythm. Heart sounds are normal Respiratory: Normal respiratory effort.  No retractions, lungs c t a  Abd: soft nontender bs normal all 4 quad, no CVA tenderness GU: deferred Musculoskeletal: FROM all extremities, warm and well perfused Neurologic:  Normal speech and language.  Skin:  Skin is warm, dry and intact. No rash noted. Psychiatric: Mood and affect are normal. Speech and behavior are normal.  ____________________________________________   LABS (all labs ordered are listed, but only abnormal results are displayed)  Labs Reviewed  URINALYSIS, COMPLETE (UACMP) WITH MICROSCOPIC - Abnormal; Notable for the following components:      Result Value   Color, Urine YELLOW (*)    APPearance CLOUDY (*)    Hgb urine dipstick LARGE (*)    Protein, ur 100 (*)    Leukocytes,Ua LARGE (*)    RBC / HPF >50 (*)    WBC, UA >50 (*)    All other components within normal limits  URINE CULTURE   ____________________________________________   ____________________________________________  RADIOLOGY  CT renal stone study does not show any calculi or abscess, no pyelonephritis noted  ____________________________________________   PROCEDURES  Procedure(s) performed: Rocephin 1 g IV, normal saline 500 mL IV   Procedures    ____________________________________________   INITIAL IMPRESSION / ASSESSMENT AND PLAN / ED COURSE  Pertinent labs & imaging results that were available during my care of the patient were reviewed by me and considered in my medical decision making (see chart for details).   Patient 78 year old female presents emergency department complaining of dysuria and bladder spasms since Sunday.  Symptoms are severe per the patient.  She denies fever or chills.  Physical exam shows patient to be uncomfortable.  No CVA tenderness noted.  Patient is afebrile this time she does not appear to be toxic.  UA has large leuks, greater than 50 RBCs, greater than 50  WBCs with no bacteria noted.  Urine culture added  CT renal stone is negative  Discussed the findings with the patient and her husband.  She was given Rocephin 1 g IV while here in the ED.  She is to follow-up with her doctor tomorrow as previously scheduled.  And a prescription for Keflex 500 3 times daily for 7 days.  Pyridium 200 mg 3 times daily if needed for bladder spasms and pain.  Return to the emergency department worsening.  She states she understands will comply.  She is discharged stable condition.    Morrison Old was evaluated in Emergency Department on 07/27/2019 for the symptoms described in the history of present illness. She was evaluated in the context of the global COVID-19 pandemic, which necessitated consideration that the patient might be at risk for infection with the SARS-CoV-2 virus that causes COVID-19.  Institutional protocols and algorithms that pertain to the evaluation of patients at risk for COVID-19 are in a state of rapid change based on information released by regulatory bodies including the CDC and federal and state organizations. These policies and algorithms were followed during the patient's care in the ED.   As part of my medical decision making, I reviewed the following data within the [place] notes reviewed and incorporated, Labs reviewed ua shows infection, Old chart reviewed, Radiograph reviewed CT renal stone is negative, Notes from prior ED visits and [place] Controlled Substance Database  ____________________________________________   FINAL CLINICAL IMPRESSION(S) / ED DIAGNOSES  Final diagnoses:  Acute UTI      NEW MEDICATIONS STARTED DURING THIS VISIT:  New Prescriptions   PHENAZOPYRIDINE (PYRIDIUM) 200 MG TABLET    Take 1 tablet (200 mg total) by mouth 3 (three) times daily as needed for pain.     Note:  This document was prepared using Dragon voice recognition software and may include unintentional dictation  errors.    Versie Starks, PA-C 07/27/19 2322    Harvest Dark, MD 07/30/19 2000

## 2019-07-27 NOTE — Discharge Instructions (Addendum)
Follow-up with your regular doctor.  Take your medication as prescribed.  Drink plenty of water.  Return emergency department worsening.

## 2019-07-29 LAB — URINE CULTURE: Culture: <10000 — AB

## 2020-07-28 IMAGING — CT CT RENAL STONE PROTOCOL
2 of 4 series · 17 of 46 positions shown, 19 images · non-contrast
Comparison: 07/28/2013

CLINICAL DATA: Dysuria and flank pain, initial encounter

EXAM:
CT ABDOMEN AND PELVIS WITHOUT CONTRAST
TECHNIQUE: Multidetector CT imaging of the abdomen and pelvis was performed
following the standard protocol without IV contrast.

[Series 2: stone full standard · axial · 0.87mm/px · z∈[-286,+134]mm · 14 of 92 slices shown, 16 images]
[im 4/92  soft-tissue]
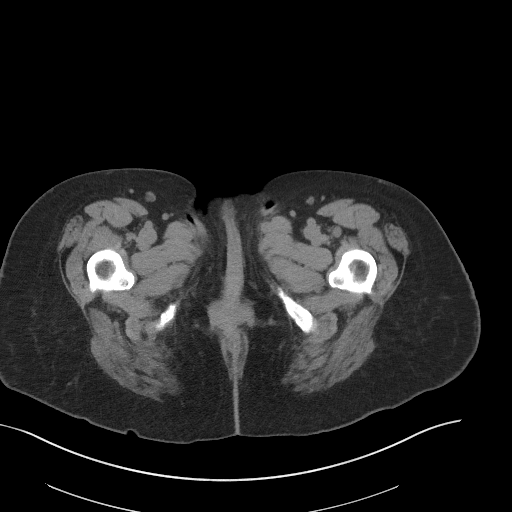
[im 4/92  bone]
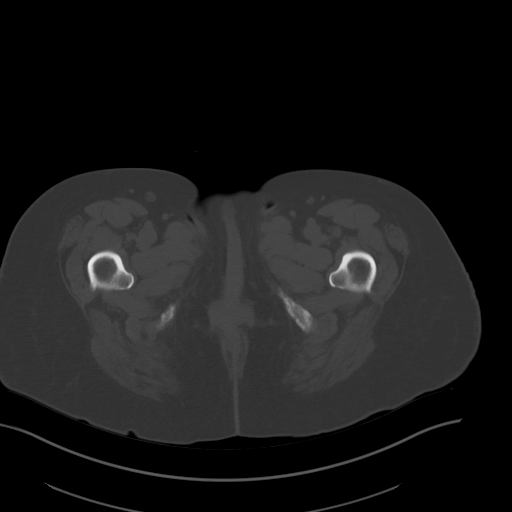
[im 12/92  soft-tissue]
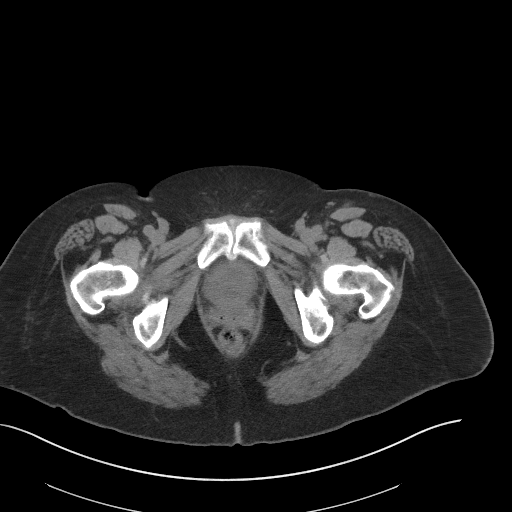
[im 19/92  soft-tissue]
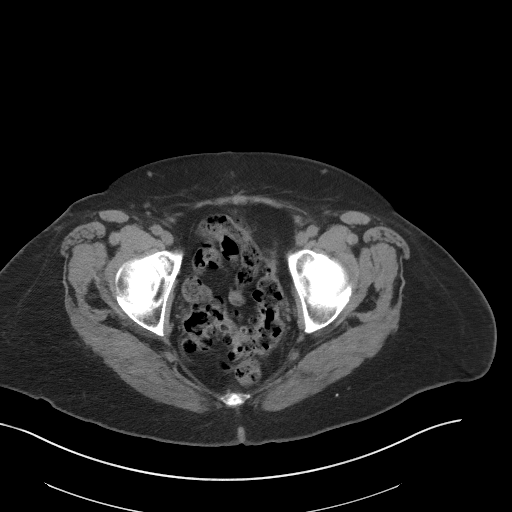
[im 23/92  soft-tissue]
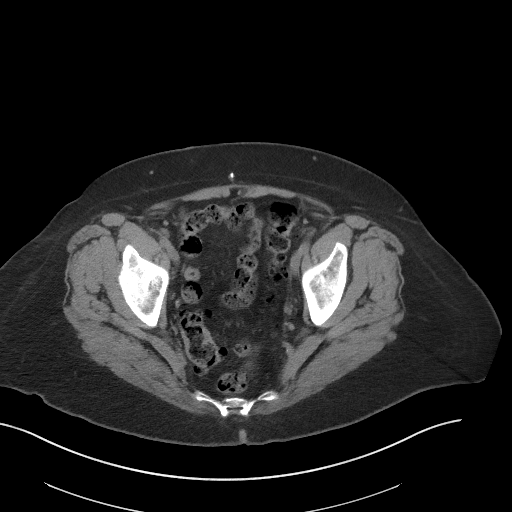
[im 31/92  soft-tissue]
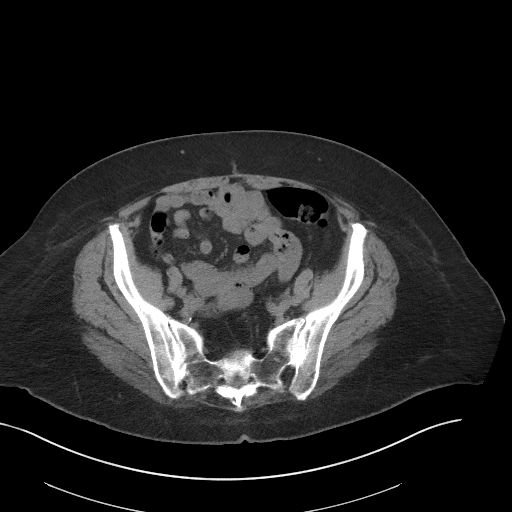
[im 38/92  soft-tissue]
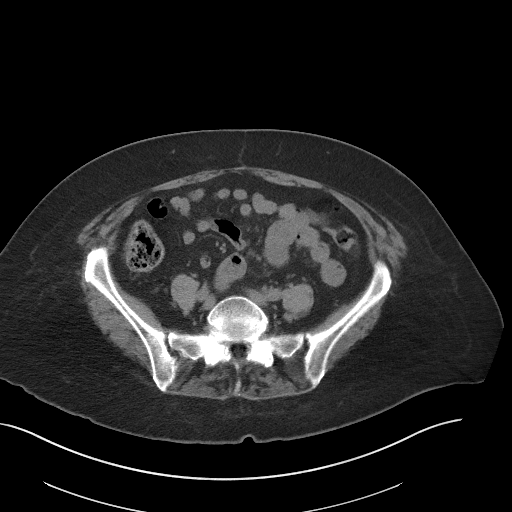
[im 42/92  soft-tissue]
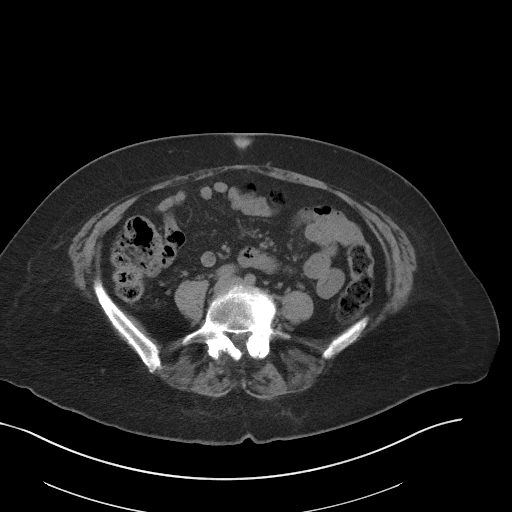
[im 50/92  soft-tissue]
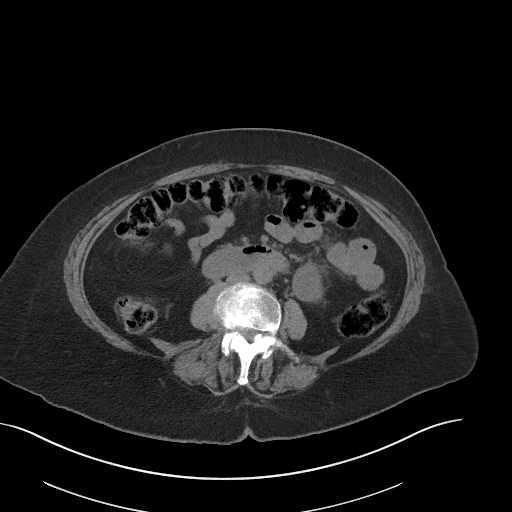
[im 54/92  soft-tissue]
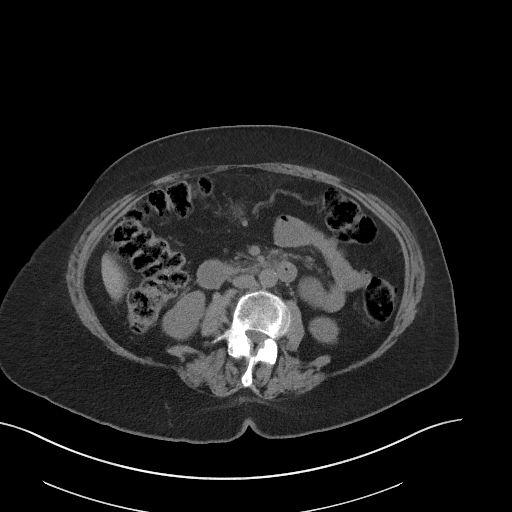
[im 54/92  bone]
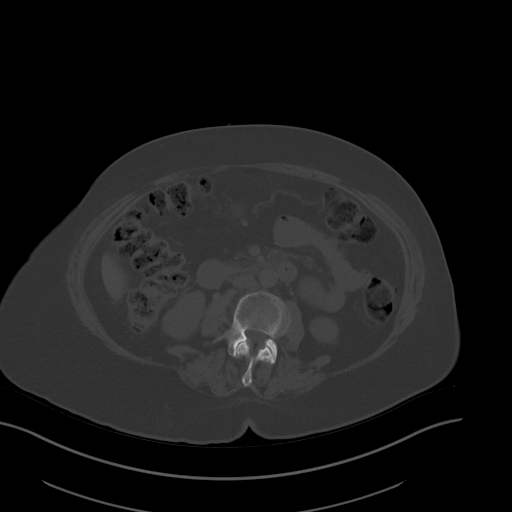
[im 61/92  soft-tissue]
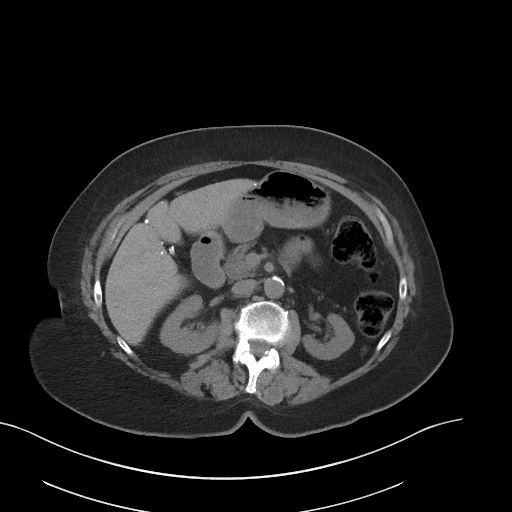
[im 69/92  soft-tissue]
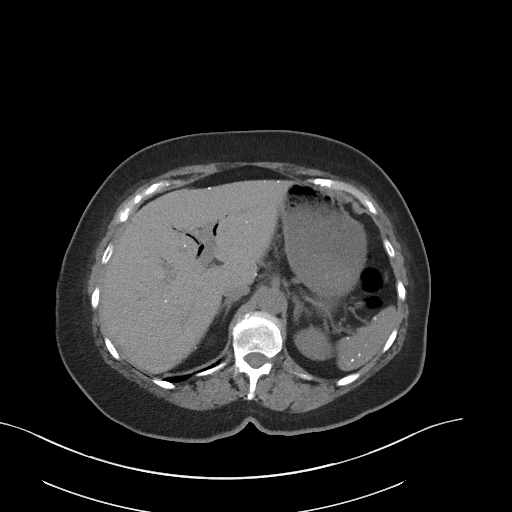
[im 73/92  soft-tissue]
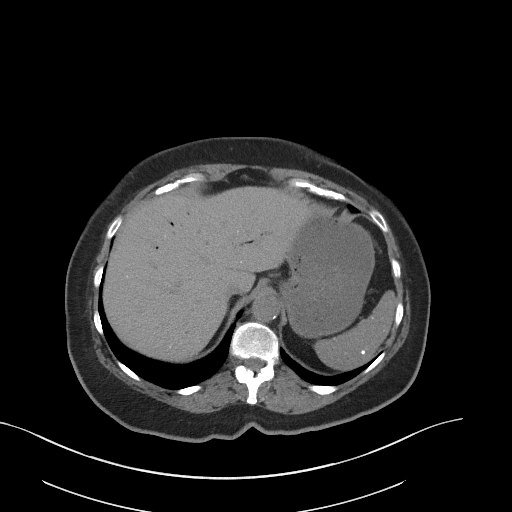
[im 80/92  soft-tissue]
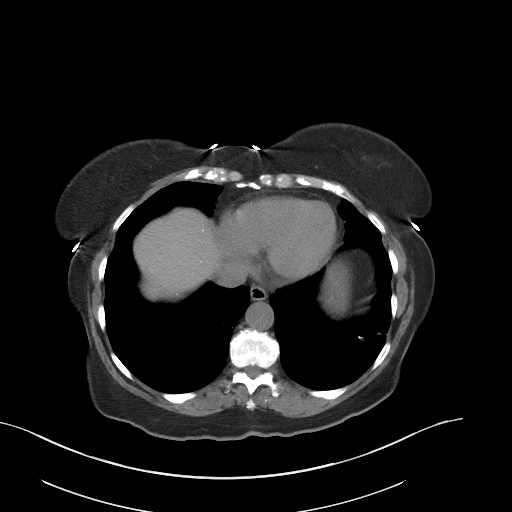
[im 88/92  soft-tissue]
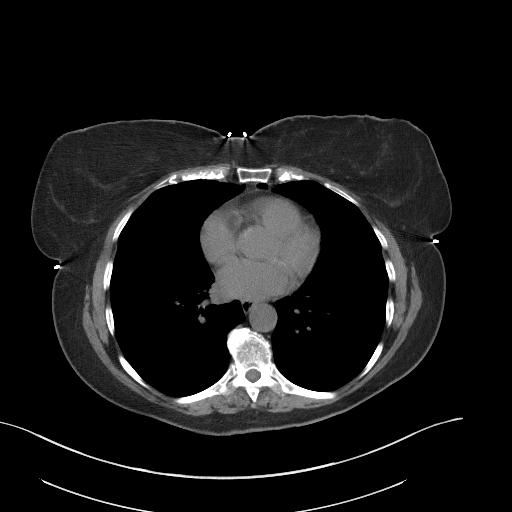

[Series 5: coronal · coronal · 0.92mm/px · 3 of 131 slices shown]
[im 44/131  soft-tissue]
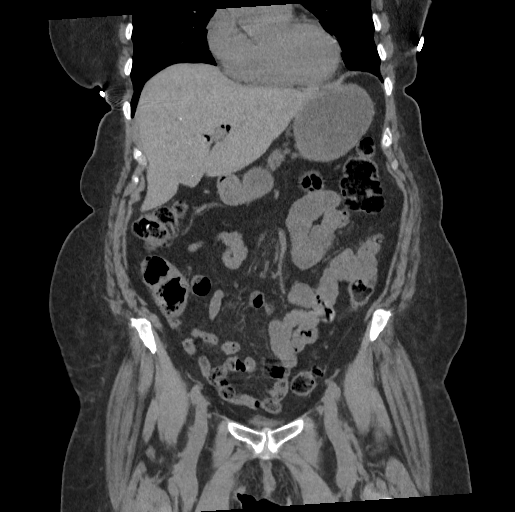
[im 58/131  soft-tissue]
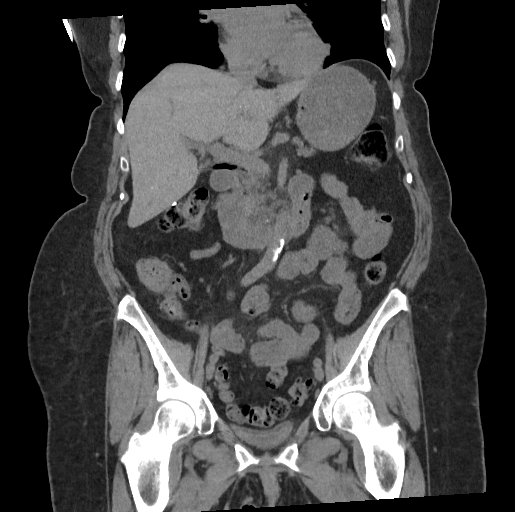
[im 73/131  soft-tissue]
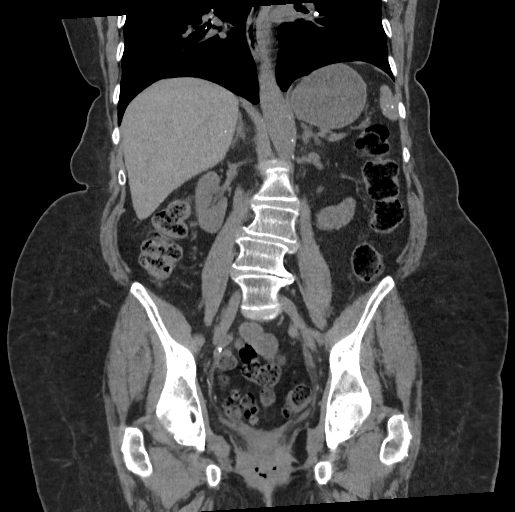

[17 of 46 positions shown; findings below may reference images not displayed]

FINDINGS: Lower chest: Some scarring in the left base is noted. A few
scattered calcified granulomas are seen.

Hepatobiliary: Gallbladder has been surgically removed. Pneumobilia
is noted likely related to prior instrumentation. Scattered
calcified granulomas are noted throughout the liver.

Pancreas: Unremarkable. No pancreatic ductal dilatation or
surrounding inflammatory changes.

Spleen: Scattered splenic granulomas are seen.

Adrenals/Urinary Tract: Adrenal glands are unremarkable. Kidneys are
well visualize without renal calculi or obstructive changes. The
bladder is decompressed.

Stomach/Bowel: Scattered diverticular change of the colon is noted
without evidence of diverticulitis. The appendix is within normal
limits. Small bowel and stomach are unremarkable.

Vascular/Lymphatic: Aortic atherosclerosis. No enlarged abdominal or
pelvic lymph nodes.

Reproductive: Status post hysterectomy. No adnexal masses.

Other: No abdominal wall hernia or abnormality. No abdominopelvic
ascites.

Musculoskeletal: Degenerative changes of the lumbar spine are noted.
IMPRESSION: No renal calculi or obstructive changes are seen.

Changes consistent with prior granulomatous disease.

## 2021-10-30 ENCOUNTER — Other Ambulatory Visit: Payer: Self-pay | Admitting: Orthopedic Surgery

## 2021-10-30 DIAGNOSIS — M4807 Spinal stenosis, lumbosacral region: Secondary | ICD-10-CM

## 2021-11-08 ENCOUNTER — Ambulatory Visit
Admission: RE | Admit: 2021-11-08 | Discharge: 2021-11-08 | Disposition: A | Discharge status: Home / Self Care | Payer: Medicare Other | Source: Ambulatory Visit | Attending: Orthopedic Surgery | Admitting: Orthopedic Surgery

## 2021-11-08 ENCOUNTER — Other Ambulatory Visit: Payer: Self-pay

## 2021-11-08 DIAGNOSIS — M4807 Spinal stenosis, lumbosacral region: Secondary | ICD-10-CM | POA: Diagnosis present

## 2022-06-13 ENCOUNTER — Other Ambulatory Visit: Payer: Self-pay | Admitting: Orthopedic Surgery

## 2022-06-20 ENCOUNTER — Encounter
Admission: RE | Admit: 2022-06-20 | Discharge: 2022-06-20 | Disposition: A | Discharge status: Home / Self Care | Payer: Medicare Other | Source: Ambulatory Visit | Attending: Orthopedic Surgery | Admitting: Orthopedic Surgery

## 2022-06-20 ENCOUNTER — Other Ambulatory Visit: Payer: Self-pay

## 2022-06-20 VITALS — BP 147/81 | HR 64 | Resp 16 | Ht 62.0 in | Wt 187.8 lb

## 2022-06-20 DIAGNOSIS — Z01812 Encounter for preprocedural laboratory examination: Secondary | ICD-10-CM

## 2022-06-20 DIAGNOSIS — Z01818 Encounter for other preprocedural examination: Secondary | ICD-10-CM | POA: Diagnosis present

## 2022-06-20 HISTORY — DX: Myoneural disorder, unspecified: G70.9

## 2022-06-20 HISTORY — DX: Nausea with vomiting, unspecified: R11.2

## 2022-06-20 HISTORY — DX: Hypothyroidism, unspecified: E03.9

## 2022-06-20 HISTORY — DX: Other specified postprocedural states: Z98.890

## 2022-06-20 LAB — COMPREHENSIVE METABOLIC PANEL
ALT: 18 U/L (ref 0–44)
AST: 24 U/L (ref 15–41)
Albumin: 4.6 g/dL (ref 3.5–5.0)
Alkaline Phosphatase: 47 U/L (ref 38–126)
Anion gap: 10 (ref 5–15)
BUN: 25 mg/dL — ABNORMAL HIGH (ref 8–23)
CO2: 28 mmol/L (ref 22–32)
Calcium: 10 mg/dL (ref 8.9–10.3)
Chloride: 101 mmol/L (ref 98–111)
Creatinine, Ser: 0.92 mg/dL (ref 0.44–1.00)
GFR, Estimated: >60 mL/min (ref >60)
Glucose, Bld: 95 mg/dL (ref 70–99)
Potassium: 4 mmol/L (ref 3.5–5.1)
Sodium: 139 mmol/L (ref 135–145)
Total Bilirubin: 0.5 mg/dL (ref 0.3–1.2)
Total Protein: 7.6 g/dL (ref 6.5–8.1)

## 2022-06-20 LAB — URINALYSIS, ROUTINE W REFLEX MICROSCOPIC
Bacteria, UA: NONE SEEN
Bilirubin Urine: NEGATIVE
Glucose, UA: NEGATIVE mg/dL
Ketones, ur: NEGATIVE mg/dL
Leukocytes,Ua: NEGATIVE
Nitrite: NEGATIVE
Protein, ur: NEGATIVE mg/dL
Specific Gravity, Urine: 1.012 (ref 1.005–1.030)
pH: 6 (ref 5.0–8.0)

## 2022-06-20 LAB — CBC WITH DIFFERENTIAL/PLATELET
Abs Immature Granulocytes: 0.02 10*3/uL (ref 0.00–0.07)
Basophils Absolute: 0.1 10*3/uL (ref 0.0–0.1)
Basophils Relative: 1 %
Eosinophils Absolute: 0.4 10*3/uL (ref 0.0–0.5)
Eosinophils Relative: 5 %
HCT: 43.6 % (ref 36.0–46.0)
Hemoglobin: 14.3 g/dL (ref 12.0–15.0)
Immature Granulocytes: 0 %
Lymphocytes Relative: 35 %
Lymphs Abs: 2.8 10*3/uL (ref 0.7–4.0)
MCH: 30.2 pg (ref 26.0–34.0)
MCHC: 32.8 g/dL (ref 30.0–36.0)
MCV: 92 fL (ref 80.0–100.0)
Monocytes Absolute: 0.6 10*3/uL (ref 0.1–1.0)
Monocytes Relative: 8 %
Neutro Abs: 4 10*3/uL (ref 1.7–7.7)
Neutrophils Relative %: 51 %
Platelets: 288 10*3/uL (ref 150–400)
RBC: 4.74 MIL/uL (ref 3.87–5.11)
RDW: 12.3 % (ref 11.5–15.5)
WBC: 7.9 10*3/uL (ref 4.0–10.5)
nRBC: 0 % (ref 0.0–0.2)

## 2022-06-20 LAB — TYPE AND SCREEN
ABO/RH(D): O POS
Antibody Screen: NEGATIVE

## 2022-06-20 LAB — SURGICAL PCR SCREEN
MRSA, PCR: NEGATIVE
Staphylococcus aureus: POSITIVE — AB

## 2022-06-20 NOTE — Patient Instructions (Addendum)
Your procedure is scheduled on: 07/04/22 - Thursday Report to the Registration Desk on the 1st floor of the [facility]. To find out your arrival time, please call [phone] between 1PM - 3PM on: 07/03/22 - Wednesday If your arrival time is 6:00 am, do not arrive prior to that time as the [facility] entrance doors do not open until 6:00 am.  REMEMBER: Instructions that are not followed completely may result in serious medical risk, up to and including death; or upon the discretion of your surgeon and anesthesiologist your surgery may need to be rescheduled.  Do not eat food after midnight the night before surgery.  No gum chewing, lozengers or hard candies.  You may however, drink CLEAR liquids up to 2 hours before you are scheduled to arrive for your surgery. Do not drink anything within 2 hours of your scheduled arrival time.  Clear liquids include: - water  - apple juice without pulp - gatorade (not RED colors) - black coffee or tea (Do NOT add milk or creamers to the coffee or tea) Do NOT drink anything that is not on this list.  In addition, your doctor has ordered for you to drink the provided  Ensure Pre-Surgery Clear Carbohydrate Drink  Drinking this carbohydrate drink up to two hours before surgery helps to reduce insulin resistance and improve patient outcomes. Please complete drinking 2 hours prior to scheduled arrival time.  TAKE ONLY THESE MEDICATIONS THE MORNING OF SURGERY WITH A SIP OF WATER:  - levothyroxine (SYNTHROID, LEVOTHROID) 75 MCG tablet - cycloSPORINE (RESTASIS) 0.05 % ophthalmic emulsion  One week prior to surgery: Stop Anti-inflammatories (NSAIDS) such as Advil, Aleve, Ibuprofen, Motrin, Naproxen, Naprosyn and Aspirin based products such as Excedrin, Goodys Powder, BC Powder.  Stop ANY OVER THE COUNTER supplements beginning 06/27/22 until after surgery.Apoaequorin (PREVAGEN PO),magnesium oxide (MAG-OX) 400 (240 Mg) MG tablet, GLUCOSAMINE CHOND CMP  ADVANCED PO), Multiple Vitamin (MULTIVITAMIN) capsule, Beet Root, Potassium 99 MG TABS, [place] Leg cramps,   You may take Tylenol if needed for pain up until the day of surgery.  No Alcohol for 24 hours before or after surgery.  No Smoking including e-cigarettes for 24 hours prior to surgery.  No chewable tobacco products for at least 6 hours prior to surgery.  No nicotine patches on the day of surgery.  Do not use any "recreational" drugs for at least a week prior to your surgery.  Please be advised that the combination of cocaine and anesthesia may have negative outcomes, up to and including death. If you test positive for cocaine, your surgery will be cancelled.  On the morning of surgery brush your teeth with toothpaste and water, you may rinse your mouth with mouthwash if you wish. Do not swallow any toothpaste or mouthwash.  Use CHG Soap or wipes as directed on instruction sheet.  Do not wear jewelry, make-up, hairpins, clips or nail polish.  Do not wear lotions, powders, or perfumes.   Do not shave body from the neck down 48 hours prior to surgery just in case you cut yourself which could leave a site for infection.  Also, freshly shaved skin may become irritated if using the CHG soap.  Contact lenses, hearing aids and dentures may not be worn into surgery.  Do not bring valuables to the hospital. [facility] is not responsible for any missing/lost belongings or valuables.   Notify your doctor if there is any change in your medical condition (cold, fever, infection).  Wear comfortable clothing (  specific to your surgery type) to the hospital.  After surgery, you can help prevent lung complications by doing breathing exercises.  Take deep breaths and cough every 1-2 hours. Your doctor may order a device called an Incentive Spirometer to help you take deep breaths. When coughing or sneezing, hold a pillow firmly against your incision with both hands. This is called  "splinting." Doing this helps protect your incision. It also decreases belly discomfort.  If you are being admitted to the hospital overnight, leave your suitcase in the car. After surgery it may be brought to your room.  If you are being discharged the day of surgery, you will not be allowed to drive home. You will need a responsible adult (18 years or older) to drive you home and stay with you that night.   If you are taking public transportation, you will need to have a responsible adult (18 years or older) with you. Please confirm with your physician that it is acceptable to use public transportation.   Please call the [facility] [facility] [place] [facility] Dept. at [phone] if you have any questions about these instructions.  Surgery Visitation Policy:  Patients undergoing a surgery or procedure may have two family members or support persons with them as long as the person is not COVID-19 positive or experiencing its symptoms.   Inpatient Visitation:    Visiting hours are 7 a.m. to 8 p.m. Up to four visitors are allowed at one time in a patient room, including children. The visitors may rotate out with other people during the day. One designated support person (adult) may remain overnight.

## 2022-07-04 ENCOUNTER — Other Ambulatory Visit: Payer: Self-pay

## 2022-07-04 ENCOUNTER — Inpatient Hospital Stay
Admission: AD | Admit: 2022-07-04 | Discharge: 2022-07-09 | DRG: 470 | Disposition: A | Discharge status: Skilled Nursing Facility | Payer: Medicare Other | Attending: Orthopedic Surgery | Admitting: Orthopedic Surgery

## 2022-07-04 ENCOUNTER — Observation Stay: Payer: Medicare Other

## 2022-07-04 ENCOUNTER — Encounter
Admission: AD | Disposition: A | Discharge status: Skilled Nursing Facility | Payer: Self-pay | Source: Home / Self Care | Attending: Orthopedic Surgery

## 2022-07-04 ENCOUNTER — Encounter: Payer: Self-pay | Admitting: Orthopedic Surgery

## 2022-07-04 ENCOUNTER — Ambulatory Visit: Payer: Medicare Other | Admitting: Urgent Care

## 2022-07-04 ENCOUNTER — Ambulatory Visit: Payer: Medicare Other

## 2022-07-04 DIAGNOSIS — M479 Spondylosis, unspecified: Secondary | ICD-10-CM | POA: Diagnosis present

## 2022-07-04 DIAGNOSIS — Z79890 Hormone replacement therapy (postmenopausal): Secondary | ICD-10-CM

## 2022-07-04 DIAGNOSIS — Z79899 Other long term (current) drug therapy: Secondary | ICD-10-CM

## 2022-07-04 DIAGNOSIS — M654 Radial styloid tenosynovitis [de Quervain]: Secondary | ICD-10-CM | POA: Diagnosis present

## 2022-07-04 DIAGNOSIS — E785 Hyperlipidemia, unspecified: Secondary | ICD-10-CM | POA: Diagnosis present

## 2022-07-04 DIAGNOSIS — Z9049 Acquired absence of other specified parts of digestive tract: Secondary | ICD-10-CM

## 2022-07-04 DIAGNOSIS — M170 Bilateral primary osteoarthritis of knee: Principal | ICD-10-CM | POA: Diagnosis present

## 2022-07-04 DIAGNOSIS — Z90710 Acquired absence of both cervix and uterus: Secondary | ICD-10-CM

## 2022-07-04 DIAGNOSIS — F419 Anxiety disorder, unspecified: Secondary | ICD-10-CM | POA: Diagnosis present

## 2022-07-04 DIAGNOSIS — M21062 Valgus deformity, not elsewhere classified, left knee: Secondary | ICD-10-CM | POA: Diagnosis present

## 2022-07-04 DIAGNOSIS — Z885 Allergy status to narcotic agent status: Secondary | ICD-10-CM

## 2022-07-04 DIAGNOSIS — E039 Hypothyroidism, unspecified: Secondary | ICD-10-CM | POA: Diagnosis present

## 2022-07-04 DIAGNOSIS — G629 Polyneuropathy, unspecified: Secondary | ICD-10-CM | POA: Diagnosis present

## 2022-07-04 DIAGNOSIS — E041 Nontoxic single thyroid nodule: Secondary | ICD-10-CM | POA: Diagnosis present

## 2022-07-04 DIAGNOSIS — Z96652 Presence of left artificial knee joint: Principal | ICD-10-CM

## 2022-07-04 DIAGNOSIS — Z85828 Personal history of other malignant neoplasm of skin: Secondary | ICD-10-CM

## 2022-07-04 HISTORY — PX: TOTAL KNEE ARTHROPLASTY: SHX125

## 2022-07-04 LAB — CBC
HCT: 39.2 % (ref 36.0–46.0)
Hemoglobin: 12.5 g/dL (ref 12.0–15.0)
MCH: 30.1 pg (ref 26.0–34.0)
MCHC: 31.9 g/dL (ref 30.0–36.0)
MCV: 94.5 fL (ref 80.0–100.0)
Platelets: 212 10*3/uL (ref 150–400)
RBC: 4.15 MIL/uL (ref 3.87–5.11)
RDW: 12.8 % (ref 11.5–15.5)
WBC: 9.2 10*3/uL (ref 4.0–10.5)
nRBC: 0 % (ref 0.0–0.2)

## 2022-07-04 LAB — CREATININE, SERUM
Creatinine, Ser: 0.93 mg/dL (ref 0.44–1.00)
GFR, Estimated: >60 mL/min (ref >60)

## 2022-07-04 LAB — ABO/RH: ABO/RH(D): O POS

## 2022-07-04 SURGERY — ARTHROPLASTY, KNEE, TOTAL
Anesthesia: Spinal | Site: Knee | Laterality: Left

## 2022-07-04 MED ORDER — LACTATED RINGERS IV SOLN: INTRAVENOUS | Status: DC

## 2022-07-04 MED ORDER — DIPHENHYDRAMINE HCL 12.5 MG/5ML PO ELIX: 12.5000 mg | ORAL_SOLUTION | ORAL | Status: DC | PRN

## 2022-07-04 MED ORDER — NEOMYCIN-POLYMYXIN B GU 40-200000 IR SOLN
Status: AC
  Filled 2022-07-04: qty 20

## 2022-07-04 MED ORDER — HYDROMORPHONE HCL 2 MG PO TABS
2.0000 mg | ORAL_TABLET | ORAL | Status: DC | PRN
  Administered 2022-07-05 – 2022-07-08 (×4): 2 mg via ORAL
  Filled 2022-07-04 (×4): qty 1

## 2022-07-04 MED ORDER — HYDROCODONE-ACETAMINOPHEN 5-325 MG PO TABS
1.0000 | ORAL_TABLET | ORAL | Status: DC | PRN
  Administered 2022-07-04 – 2022-07-05 (×2): 1 via ORAL
  Administered 2022-07-05 – 2022-07-06 (×2): 2 via ORAL
  Administered 2022-07-09 (×2): 1 via ORAL
  Filled 2022-07-04: qty 1
  Filled 2022-07-04: qty 2
  Filled 2022-07-04 (×4): qty 1
  Filled 2022-07-04 (×2): qty 2

## 2022-07-04 MED ORDER — ONDANSETRON HCL 4 MG PO TABS
4.0000 mg | ORAL_TABLET | Freq: Four times a day (QID) | ORAL | Status: DC | PRN
  Administered 2022-07-04 – 2022-07-09 (×2): 4 mg via ORAL
  Filled 2022-07-04 (×2): qty 1

## 2022-07-04 MED ORDER — PROPOFOL 1000 MG/100ML IV EMUL
INTRAVENOUS | Status: AC
  Filled 2022-07-04: qty 100

## 2022-07-04 MED ORDER — METHOCARBAMOL 500 MG PO TABS
500.0000 mg | ORAL_TABLET | Freq: Four times a day (QID) | ORAL | Status: DC | PRN
  Administered 2022-07-04: 500 mg via ORAL
  Filled 2022-07-04: qty 1

## 2022-07-04 MED ORDER — APOAEQUORIN 10 MG PO CAPS: 1.0000 | ORAL_CAPSULE | Freq: Every day | ORAL | Status: DC

## 2022-07-04 MED ORDER — VITAMIN B-12 1000 MCG PO TABS
1000.0000 ug | ORAL_TABLET | Freq: Every day | ORAL | Status: DC
  Administered 2022-07-04 – 2022-07-09 (×6): 1000 ug via ORAL
  Filled 2022-07-04 (×6): qty 1

## 2022-07-04 MED ORDER — FENTANYL CITRATE (PF) 100 MCG/2ML IJ SOLN: 25.0000 ug | INTRAMUSCULAR | Status: DC | PRN

## 2022-07-04 MED ORDER — PHENYLEPHRINE HCL-NACL 20-0.9 MG/250ML-% IV SOLN
INTRAVENOUS | Status: DC | PRN
  Administered 2022-07-04: 30 ug/min via INTRAVENOUS

## 2022-07-04 MED ORDER — SIMETHICONE 80 MG PO CHEW
80.0000 mg | CHEWABLE_TABLET | Freq: Every day | ORAL | Status: DC
  Administered 2022-07-04 – 2022-07-09 (×6): 80 mg via ORAL
  Filled 2022-07-04 (×7): qty 1

## 2022-07-04 MED ORDER — ALUM & MAG HYDROXIDE-SIMETH 200-200-20 MG/5ML PO SUSP: 30.0000 mL | ORAL | Status: DC | PRN

## 2022-07-04 MED ORDER — PROPOFOL 10 MG/ML IV BOLUS
INTRAVENOUS | Status: AC
  Filled 2022-07-04: qty 20

## 2022-07-04 MED ORDER — LEVOTHYROXINE SODIUM 88 MCG PO TABS
88.0000 ug | ORAL_TABLET | Freq: Every day | ORAL | Status: DC
  Administered 2022-07-05 – 2022-07-09 (×5): 88 ug via ORAL
  Filled 2022-07-04 (×5): qty 1

## 2022-07-04 MED ORDER — LACTATED RINGERS IV BOLUS
500.0000 mL | Freq: Once | INTRAVENOUS | Status: AC
  Administered 2022-07-04: 500 mL via INTRAVENOUS

## 2022-07-04 MED ORDER — METOCLOPRAMIDE HCL 5 MG PO TABS: 5.0000 mg | ORAL_TABLET | Freq: Three times a day (TID) | ORAL | Status: DC | PRN

## 2022-07-04 MED ORDER — ONDANSETRON HCL 4 MG/2ML IJ SOLN
INTRAMUSCULAR | Status: DC | PRN
  Administered 2022-07-04: 4 mg via INTRAVENOUS

## 2022-07-04 MED ORDER — FENTANYL CITRATE (PF) 100 MCG/2ML IJ SOLN
INTRAMUSCULAR | Status: AC
  Filled 2022-07-04: qty 2

## 2022-07-04 MED ORDER — PHENYLEPHRINE HCL-NACL 20-0.9 MG/250ML-% IV SOLN
INTRAVENOUS | Status: AC
Start: 2022-07-04 — End: ?
  Filled 2022-07-04: qty 250

## 2022-07-04 MED ORDER — GABAPENTIN 300 MG PO CAPS
300.0000 mg | ORAL_CAPSULE | Freq: Three times a day (TID) | ORAL | Status: DC
  Administered 2022-07-04 – 2022-07-09 (×15): 300 mg via ORAL
  Filled 2022-07-04 (×15): qty 1

## 2022-07-04 MED ORDER — CHLORHEXIDINE GLUCONATE 0.12 % MT SOLN
OROMUCOSAL | Status: AC
  Administered 2022-07-04: 15 mL via OROMUCOSAL
  Filled 2022-07-04: qty 15

## 2022-07-04 MED ORDER — CEFAZOLIN SODIUM-DEXTROSE 2-4 GM/100ML-% IV SOLN
2.0000 g | INTRAVENOUS | Status: AC
  Administered 2022-07-04: 2 g via INTRAVENOUS

## 2022-07-04 MED ORDER — DOCUSATE SODIUM 100 MG PO CAPS
100.0000 mg | ORAL_CAPSULE | Freq: Two times a day (BID) | ORAL | Status: DC
  Administered 2022-07-04 – 2022-07-09 (×11): 100 mg via ORAL
  Filled 2022-07-04 (×11): qty 1

## 2022-07-04 MED ORDER — FLEET ENEMA 7-19 GM/118ML RE ENEM: 1.0000 | ENEMA | Freq: Once | RECTAL | Status: DC | PRN

## 2022-07-04 MED ORDER — SODIUM CHLORIDE (PF) 0.9 % IJ SOLN
INTRAMUSCULAR | Status: DC | PRN
  Administered 2022-07-04: 91 mL via INTRAMUSCULAR

## 2022-07-04 MED ORDER — BUPIVACAINE LIPOSOME 1.3 % IJ SUSP
INTRAMUSCULAR | Status: AC
  Filled 2022-07-04: qty 20

## 2022-07-04 MED ORDER — SENNOSIDES-DOCUSATE SODIUM 8.6-50 MG PO TABS
1.0000 | ORAL_TABLET | Freq: Every evening | ORAL | Status: DC | PRN
  Administered 2022-07-08: 1 via ORAL
  Filled 2022-07-04: qty 1

## 2022-07-04 MED ORDER — CHLORHEXIDINE GLUCONATE 0.12 % MT SOLN: 15.0000 mL | Freq: Once | OROMUCOSAL | Status: AC

## 2022-07-04 MED ORDER — ONDANSETRON HCL 4 MG/2ML IJ SOLN: 4.0000 mg | Freq: Once | INTRAMUSCULAR | Status: DC | PRN

## 2022-07-04 MED ORDER — HYDROCODONE-ACETAMINOPHEN 7.5-325 MG PO TABS: 1.0000 | ORAL_TABLET | ORAL | Status: DC | PRN

## 2022-07-04 MED ORDER — ONDANSETRON HCL 4 MG/2ML IJ SOLN: 4.0000 mg | Freq: Four times a day (QID) | INTRAMUSCULAR | Status: DC | PRN

## 2022-07-04 MED ORDER — SODIUM CHLORIDE FLUSH 0.9 % IV SOLN
INTRAVENOUS | Status: AC
  Filled 2022-07-04: qty 40

## 2022-07-04 MED ORDER — FAMOTIDINE 20 MG PO TABS
ORAL_TABLET | ORAL | Status: AC
  Administered 2022-07-04: 20 mg via ORAL
  Filled 2022-07-04: qty 1

## 2022-07-04 MED ORDER — PHENYLEPHRINE HCL (PRESSORS) 10 MG/ML IV SOLN
INTRAVENOUS | Status: DC | PRN
  Administered 2022-07-04: 160 ug via INTRAVENOUS

## 2022-07-04 MED ORDER — CYCLOSPORINE 0.05 % OP EMUL
1.0000 [drp] | Freq: Two times a day (BID) | OPHTHALMIC | Status: DC
Start: 2022-07-04 — End: 2022-07-09
  Administered 2022-07-04 – 2022-07-09 (×10): 1 [drp] via OPHTHALMIC
  Filled 2022-07-04 (×12): qty 30

## 2022-07-04 MED ORDER — PHENYLEPHRINE 80 MCG/ML (10ML) SYRINGE FOR IV PUSH (FOR BLOOD PRESSURE SUPPORT): PREFILLED_SYRINGE | INTRAVENOUS | Status: DC | PRN

## 2022-07-04 MED ORDER — FAMOTIDINE 20 MG PO TABS: 20.0000 mg | ORAL_TABLET | Freq: Once | ORAL | Status: AC

## 2022-07-04 MED ORDER — RISAQUAD PO CAPS
1.0000 | ORAL_CAPSULE | Freq: Every day | ORAL | Status: DC
  Administered 2022-07-04 – 2022-07-09 (×6): 1 via ORAL
  Filled 2022-07-04 (×6): qty 1

## 2022-07-04 MED ORDER — MENTHOL 3 MG MT LOZG: 1.0000 | LOZENGE | OROMUCOSAL | Status: DC | PRN

## 2022-07-04 MED ORDER — MORPHINE SULFATE (PF) 2 MG/ML IV SOLN
0.5000 mg | INTRAVENOUS | Status: DC | PRN
  Administered 2022-07-04: 1 mg via INTRAVENOUS
  Filled 2022-07-04: qty 1

## 2022-07-04 MED ORDER — MELATONIN 5 MG PO TABS
5.0000 mg | ORAL_TABLET | Freq: Every day | ORAL | Status: DC
  Administered 2022-07-05 – 2022-07-08 (×4): 5 mg via ORAL
  Filled 2022-07-04 (×5): qty 1

## 2022-07-04 MED ORDER — TRANEXAMIC ACID 1000 MG/10ML IV SOLN
INTRAVENOUS | Status: AC
  Filled 2022-07-04: qty 10

## 2022-07-04 MED ORDER — SODIUM CHLORIDE 0.9 % IR SOLN: Status: DC | PRN

## 2022-07-04 MED ORDER — ACETAMINOPHEN 325 MG PO TABS: 325.0000 mg | ORAL_TABLET | Freq: Four times a day (QID) | ORAL | Status: DC | PRN

## 2022-07-04 MED ORDER — ACETAMINOPHEN 10 MG/ML IV SOLN
INTRAVENOUS | Status: DC | PRN
  Administered 2022-07-04: 1000 mg via INTRAVENOUS

## 2022-07-04 MED ORDER — FENTANYL CITRATE (PF) 100 MCG/2ML IJ SOLN
INTRAMUSCULAR | Status: DC | PRN
  Administered 2022-07-04 (×4): 25 ug via INTRAVENOUS

## 2022-07-04 MED ORDER — PANTOPRAZOLE SODIUM 40 MG PO TBEC
40.0000 mg | DELAYED_RELEASE_TABLET | Freq: Every day | ORAL | Status: DC
  Administered 2022-07-04 – 2022-07-09 (×6): 40 mg via ORAL
  Filled 2022-07-04 (×6): qty 1

## 2022-07-04 MED ORDER — ZOLPIDEM TARTRATE 5 MG PO TABS: 5.0000 mg | ORAL_TABLET | Freq: Every evening | ORAL | Status: DC | PRN

## 2022-07-04 MED ORDER — POTASSIUM 99 MG PO TABS: 1.0000 | ORAL_TABLET | Freq: Every day | ORAL | Status: DC

## 2022-07-04 MED ORDER — METHOCARBAMOL 1000 MG/10ML IJ SOLN: 500.0000 mg | Freq: Four times a day (QID) | INTRAVENOUS | Status: DC | PRN

## 2022-07-04 MED ORDER — TRAMADOL HCL 50 MG PO TABS
50.0000 mg | ORAL_TABLET | Freq: Four times a day (QID) | ORAL | Status: DC
  Administered 2022-07-04 – 2022-07-09 (×13): 50 mg via ORAL
  Filled 2022-07-04 (×19): qty 1

## 2022-07-04 MED ORDER — MORPHINE SULFATE (PF) 10 MG/ML IV SOLN
INTRAVENOUS | Status: AC
  Filled 2022-07-04: qty 1

## 2022-07-04 MED ORDER — MAGNESIUM OXIDE -MG SUPPLEMENT 400 (240 MG) MG PO TABS
400.0000 mg | ORAL_TABLET | Freq: Every day | ORAL | Status: DC
Start: 2022-07-04 — End: 2022-07-09
  Administered 2022-07-04 – 2022-07-09 (×6): 400 mg via ORAL
  Filled 2022-07-04 (×6): qty 1

## 2022-07-04 MED ORDER — ENOXAPARIN SODIUM 30 MG/0.3ML IJ SOSY
30.0000 mg | PREFILLED_SYRINGE | Freq: Two times a day (BID) | INTRAMUSCULAR | Status: DC
  Administered 2022-07-05 – 2022-07-09 (×9): 30 mg via SUBCUTANEOUS
  Filled 2022-07-04 (×10): qty 0.3

## 2022-07-04 MED ORDER — SODIUM CHLORIDE 0.9 % IV SOLN: INTRAVENOUS | Status: DC

## 2022-07-04 MED ORDER — BISACODYL 5 MG PO TBEC
5.0000 mg | DELAYED_RELEASE_TABLET | Freq: Every day | ORAL | Status: DC | PRN
  Administered 2022-07-06 – 2022-07-08 (×2): 5 mg via ORAL
  Filled 2022-07-04 (×2): qty 1

## 2022-07-04 MED ORDER — ADULT MULTIVITAMIN W/MINERALS CH
1.0000 | ORAL_TABLET | Freq: Every day | ORAL | Status: DC
  Administered 2022-07-04 – 2022-07-09 (×6): 1 via ORAL
  Filled 2022-07-04 (×6): qty 1

## 2022-07-04 MED ORDER — ORAL CARE MOUTH RINSE: 15.0000 mL | Freq: Once | OROMUCOSAL | Status: AC

## 2022-07-04 MED ORDER — PROPOFOL 10 MG/ML IV BOLUS
INTRAVENOUS | Status: DC | PRN
  Administered 2022-07-04: 20 ug via INTRAVENOUS

## 2022-07-04 MED ORDER — BUPIVACAINE HCL (PF) 0.5 % IJ SOLN
INTRAMUSCULAR | Status: DC | PRN
  Administered 2022-07-04: 3 mL via INTRATHECAL

## 2022-07-04 MED ORDER — PHENOL 1.4 % MT LIQD: 1.0000 | OROMUCOSAL | Status: DC | PRN

## 2022-07-04 MED ORDER — CEFAZOLIN SODIUM-DEXTROSE 2-4 GM/100ML-% IV SOLN
INTRAVENOUS | Status: AC
  Filled 2022-07-04: qty 100

## 2022-07-04 MED ORDER — ACETAMINOPHEN 10 MG/ML IV SOLN
INTRAVENOUS | Status: AC
  Filled 2022-07-04: qty 100

## 2022-07-04 MED ORDER — HYDROMORPHONE HCL 1 MG/ML IJ SOLN
1.0000 mg | INTRAMUSCULAR | Status: DC | PRN
  Administered 2022-07-04 – 2022-07-06 (×2): 1 mg via INTRAVENOUS
  Filled 2022-07-04 (×2): qty 1

## 2022-07-04 MED ORDER — SURGIPHOR WOUND IRRIGATION SYSTEM - OPTIME: TOPICAL | Status: DC | PRN

## 2022-07-04 MED ORDER — CEFAZOLIN SODIUM-DEXTROSE 2-4 GM/100ML-% IV SOLN
2.0000 g | Freq: Four times a day (QID) | INTRAVENOUS | Status: AC
  Administered 2022-07-04 (×2): 2 g via INTRAVENOUS
  Filled 2022-07-04 (×2): qty 100

## 2022-07-04 MED ORDER — BUPIVACAINE-EPINEPHRINE (PF) 0.25% -1:200000 IJ SOLN
INTRAMUSCULAR | Status: AC
  Filled 2022-07-04: qty 30

## 2022-07-04 MED ORDER — CALCIUM CARBONATE ANTACID 500 MG PO CHEW
2.0000 | CHEWABLE_TABLET | ORAL | Status: DC | PRN
Start: 2022-07-04 — End: 2022-07-09

## 2022-07-04 MED ORDER — PROPOFOL 500 MG/50ML IV EMUL
INTRAVENOUS | Status: DC | PRN
  Administered 2022-07-04: 80 ug/kg/min via INTRAVENOUS

## 2022-07-04 MED ORDER — METOCLOPRAMIDE HCL 5 MG/ML IJ SOLN: 5.0000 mg | Freq: Three times a day (TID) | INTRAMUSCULAR | Status: DC | PRN

## 2022-07-04 SURGICAL SUPPLY — 68 items
APL PRP STRL LF DISP 70% ISPRP (MISCELLANEOUS) ×2
BLADE SAGITTAL 25.0X1.19X90 (BLADE) ×2 IMPLANT
BLADE SAW 90X13X1.19 OSCILLAT (BLADE) ×2 IMPLANT
BNDG ELASTIC 6X5.8 VLCR STR LF (GAUZE/BANDAGES/DRESSINGS) ×2 IMPLANT
CANISTER WOUND CARE 500ML ATS (WOUND CARE) ×2 IMPLANT
CEMENT HV SMART SET (Cement) ×4 IMPLANT
CHLORAPREP W/TINT 26 (MISCELLANEOUS) ×4 IMPLANT
COOLER POLAR GLACIER W/PUMP (MISCELLANEOUS) ×2 IMPLANT
CUFF TOURN SGL QUICK 24 (TOURNIQUET CUFF)
CUFF TOURN SGL QUICK 34 (TOURNIQUET CUFF)
CUFF TRNQT CYL 24X4X16.5-23 (TOURNIQUET CUFF) IMPLANT
CUFF TRNQT CYL 34X4.125X (TOURNIQUET CUFF) IMPLANT
DRAPE 3/4 80X56 (DRAPES) ×4 IMPLANT
DRSG MEPILEX SACRM 8.7X9.8 (GAUZE/BANDAGES/DRESSINGS) ×2 IMPLANT
ELECT BLADE 6.5 EXT (BLADE) ×1 IMPLANT
ELECT CAUTERY BLADE 6.4 (BLADE) ×2 IMPLANT
ELECT REM PT RETURN 9FT ADLT (ELECTROSURGICAL) ×2
ELECTRODE REM PT RTRN 9FT ADLT (ELECTROSURGICAL) ×1 IMPLANT
FEM COMP SZ3 LFT (Joint) ×1 IMPLANT
GAUZE 4X4 16PLY ~~LOC~~+RFID DBL (SPONGE) ×1 IMPLANT
GAUZE XEROFORM 1X8 LF (GAUZE/BANDAGES/DRESSINGS) ×1 IMPLANT
GLOVE BIOGEL PI IND STRL 9 (GLOVE) ×1 IMPLANT
GLOVE BIOGEL PI INDICATOR 9 (GLOVE) ×1
GLOVE SURG ORTHO 8.0 STRL STRW (GLOVE) ×2 IMPLANT
GLOVE SURG SYN 9.0  PF PI (GLOVE) ×2
GLOVE SURG SYN 9.0 PF PI (GLOVE) ×1 IMPLANT
GLOVE SURG UNDER LTX SZ8 (GLOVE) ×2 IMPLANT
GOWN SRG 2XL LVL 4 RGLN SLV (GOWNS) ×1 IMPLANT
GOWN STRL NON-REIN 2XL LVL4 (GOWNS) ×2
GOWN STRL REUS W/ TWL LRG LVL3 (GOWN DISPOSABLE) ×1 IMPLANT
GOWN STRL REUS W/ TWL XL LVL3 (GOWN DISPOSABLE) ×1 IMPLANT
GOWN STRL REUS W/TWL LRG LVL3 (GOWN DISPOSABLE) ×2
GOWN STRL REUS W/TWL XL LVL3 (GOWN DISPOSABLE) ×2
HOLDER FOLEY CATH W/STRAP (MISCELLANEOUS) ×2 IMPLANT
HOOD PEEL AWAY FLYTE STAYCOOL (MISCELLANEOUS) ×4 IMPLANT
IV NS IRRIG 3000ML ARTHROMATIC (IV SOLUTION) ×2 IMPLANT
KIT PREVENA INCISION MGT20CM45 (CANNISTER) ×2 IMPLANT
KIT TURNOVER KIT A (KITS) ×2 IMPLANT
MANIFOLD NEPTUNE II (INSTRUMENTS) ×4 IMPLANT
NDL SAFETY ECLIPSE 18X1.5 (NEEDLE) ×1 IMPLANT
NDL SPNL 20GX3.5 QUINCKE YW (NEEDLE) ×1 IMPLANT
NEEDLE HYPO 18GX1.5 SHARP (NEEDLE) ×2
NEEDLE SPNL 20GX3.5 QUINCKE YW (NEEDLE) ×2 IMPLANT
NS IRRIG 1000ML POUR BTL (IV SOLUTION) ×2 IMPLANT
PACK TOTAL KNEE (MISCELLANEOUS) ×2 IMPLANT
PAD WRAPON POLAR KNEE (MISCELLANEOUS) ×1 IMPLANT
PATELLA RESURFACING MEDACTA 02 (Bone Implant) ×1 IMPLANT
PULSAVAC PLUS IRRIG FAN TIP (DISPOSABLE) ×2
SCALPEL PROTECTED #10 DISP (BLADE) ×4 IMPLANT
SOLUTION IRRIG SURGIPHOR (IV SOLUTION) ×2 IMPLANT
STAPLER SKIN PROX 35W (STAPLE) ×2 IMPLANT
STEM EXTENSION 11MMX30MM (Stem) ×1 IMPLANT
SUCTION FRAZIER HANDLE 10FR (MISCELLANEOUS) ×2
SUCTION TUBE FRAZIER 10FR DISP (MISCELLANEOUS) ×1 IMPLANT
SUT DVC 2 QUILL PDO  T11 36X36 (SUTURE) ×2
SUT DVC 2 QUILL PDO T11 36X36 (SUTURE) ×1 IMPLANT
SUT ETHIBOND 2 V 37 (SUTURE) ×1 IMPLANT
SUT V-LOC 90 ABS DVC 3-0 CL (SUTURE) ×2 IMPLANT
SYR 20ML LL LF (SYRINGE) ×2 IMPLANT
SYR 50ML LL SCALE MARK (SYRINGE) ×4 IMPLANT
TIB INSERT FIXED SZ2 10MM 0207 (Insert) ×1 IMPLANT
TIBIAL TRAY FIXED MEDACTA 0207 (Joint) ×1 IMPLANT
TIP FAN IRRIG PULSAVAC PLUS (DISPOSABLE) ×1 IMPLANT
TOWEL OR 17X26 4PK STRL BLUE (TOWEL DISPOSABLE) ×2 IMPLANT
TOWER CARTRIDGE SMART MIX (DISPOSABLE) ×2 IMPLANT
TRAY FOLEY MTR SLVR 16FR STAT (SET/KITS/TRAYS/PACK) ×2 IMPLANT
WATER STERILE IRR 1000ML POUR (IV SOLUTION) ×2 IMPLANT
WRAPON POLAR PAD KNEE (MISCELLANEOUS) ×2

## 2022-07-04 NOTE — H&P (Signed)
Chief Complaint  Patient presents with   Left Knee - Pain    History of the Present Illness: Jodi [name] is a 81 y.o. female here today.   The patient presents for evaluation of bilateral knee osteoarthritis. Her last x-rays were in 10/2021 showing valgus deformity to the left knee with loss of lateral joint space as well as patellofemoral degenerative changes that were severe.  The patient states she has had 2 to 3 cortisone injections and viscosupplementation injections in her left knee. She has never had surgery on her left knee. She states she has been living with her left knee pain for several years, and it is clearly getting worse. The patient states her biggest pain is in her left knee, back, hips, and bones. She states if the doctor touches the bone in her left hip while she is lying in bed and if he puts it in the wrong spot, after about 10 seconds, it starts to cause pain. She states it does not hurt all the time. The patient states she cannot cross her left leg over her right leg.  The patient states she has arthritis in her back. She had a hip x-ray in 2016 in the walk-in clinic, and it was not bad. She states she has neuropathy in her feet and hands, and sometimes it goes all the way up to her shoulder. She has a thyroid nodule, and she is scheduled for a consultation next month. She states approximately 3 weeks ago, she began to feel a pain in her left wrist, and every day it got progressively worse, and she could not even stand up. She states she cannot bend her left arm up.  The patient lives in [place], [place]. She has 1 step to enter her house.  I have reviewed past medical, surgical, social and family history, and allergies as documented in the EMR.  Past Medical History: Past Medical History:  Diagnosis Date   Arthritis   Benign neoplasm of colon   History of chicken pox   Hyperlipidemia   Other symptoms involving digestive system(787.99)   Peripheral neuropathy    Past Surgical History: Past Surgical History:  Procedure Laterality Date   APPENDECTOMY 1973   HYSTERECTOMY TOTAL ABDOMINAL W/REMOVAL TUBES &/OR OVARIES 1987  Secondary to precancerous lesion.   lower back surgery [place]   neck surgery 1999   PLANTAR FASCIECTOMY Left 11/1999   KNEE ARTHROSCOPY 01/2006  Left knee   CHOLECYSTECTOMY 08/2006   CATARACT EXTRACTION 2012  Bilateral   cervical spine ACDF  x2   COLONOSCOPY 11/10/08; 02/25/13  Diverticulosis, internal hemorrhoids   INCISION TENDON SHEATH FOR TRIGGER FINGER   REDUCTION OVERCORRECTION PTOSIS   Past Family History: Family History  Problem Relation Age of Onset   Asthma Mother   Stroke Maternal Grandmother   Diabetes type II Maternal Grandfather   Medications: Current Outpatient Medications Ordered in Epic  Medication Sig Dispense Refill   acetaminophen (TYLENOL) 650 MG ER tablet Take 650 mg by mouth every 8 (eight) hours as needed for Pain.   apoaequorin (PREVAGEN) capsule Take by mouth   celecoxib (CELEBREX) 200 MG capsule TAKE 1 CAPSULE BY MOUTH ONCE DAILY AS NEEDED FOR PAIN FOR UP TO 30 DAYS 30 capsule 0   cyanocobalamin (VITAMIN B12) 1000 MCG tablet Take 1,000 mcg by mouth once daily   gabapentin (NEURONTIN) 300 MG capsule Take 3-4 caps nightly 120 capsule 2   GLUCOSAM & CHONDROIT-MV & MIN3 (GLUCOSAMINE &CHONDROIT-MV-MIN3  ORAL) Take by mouth.   levothyroxine (SYNTHROID) 88 MCG tablet Take 1 tablet (88 mcg total) by mouth once daily Take on an empty stomach with a glass of water at least 30-60 minutes before breakfast. 180 tablet 1   magnesium oxide,aspartate,citr 400 mg magnesium Cap Take by mouth   melatonin 5 mg Tab Take by mouth.   MULTIVITAMIN ORAL Take 1 tablet by mouth once daily.   potassium 99 mg Tab Take by mouth   RESTASIS 0.05 % ophthalmic emulsion   simethicone 250 mg Cap Take 250 mg by mouth once daily   traMADol-acetaminophen (ULTRACET) 37.5-325 mg tablet TAKE 1  TABLET BY MOUTH EVERY 8 HOURS AS NEEDED FOR PAIN 25 tablet 0   No current Epic-ordered facility-administered medications on file.   Allergies: Allergies  Allergen Reactions   Oxycodone-Acetaminophen Itching  If taken more than a few days in a row REACTION: Itching   Pregabalin Other (See Comments)  Double vision    Body mass index is 33.42 kg/m.  Review of Systems: A comprehensive 14 point ROS was performed, reviewed, and the pertinent orthopaedic findings are documented in the HPI.  Vitals:  06/05/22 1027  BP: 126/70    General Physical Examination:   General/Constitutional: No apparent distress: well-nourished and well developed. Eyes: Pupils equal, round with synchronous movement. Lungs: Clear to auscultation HEENT: Normal Vascular: No edema, swelling or tenderness, except as noted in detailed exam. Cardiac: Heart rate and rhythm is regular. Integumentary: No impressive skin lesions present, except as noted in detailed exam. Neuro/Psych: Normal mood and affect, oriented to person, place and time.  On exam, left knee has a 15-degree contracture. Good left hip range of motion. Tenderness to the left hip.  Radiographs:  No new imaging studies were obtained today.  Assessment: ICD-10-CM  1. Primary osteoarthritis of left knee M17.12  2. 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  We discussed the patient's prior x-ray findings. I explained she has valgus deformity to the left knee with loss of lateral joint space, as well as patellofemoral degenerative changes that were severe. I recommend left total knee arthroplasty. I explained the surgery and postoperative course in detail. I gave the patient a brochure on total knee arthroplasty. I gave the patient Dr. Mitzi Hansen Kubinski's card for wrist injection.  The patient will follow up as needed.  Document  Attestation: I, Sowjanya Panditi, have reviewed and updated documentation for [facility], MD, utilizing Nuance DAX.   Electronically signed by Lauris Poag, MD at 06/05/2022 3:57 PM EDT  Reviewed  H+P. No changes noted.

## 2022-07-04 NOTE — Anesthesia Preprocedure Evaluation (Signed)
Anesthesia Evaluation  Patient identified by MRN, date of birth, ID band Patient awake    Reviewed: Allergy & Precautions, H&P , NPO status , Patient's Chart, lab work & pertinent test results, reviewed documented beta blocker date and time   History of Anesthesia Complications (+) PONV and history of anesthetic complications  Airway Mallampati: III  TM Distance: >3 FB Neck ROM: full  Mouth opening: Limited Mouth Opening  Dental  (+) Dental Advidsory Given, Caps, Teeth Intact   Pulmonary neg pulmonary ROS,    Pulmonary exam normal breath sounds clear to auscultation       Cardiovascular Exercise Tolerance: Good negative cardio ROS Normal cardiovascular exam Rhythm:regular Rate:Normal     Neuro/Psych PSYCHIATRIC DISORDERS Anxiety negative neurological ROS     GI/Hepatic Neg liver ROS, GERD  ,  Endo/Other  neg diabetesHypothyroidism   Renal/GU negative Renal ROS  negative genitourinary   Musculoskeletal   Abdominal   Peds  Hematology negative hematology ROS (+)   Anesthesia Other Findings Past Medical History: No date: Anxiety No date: Hyperlipidemia No date: Hypothyroidism No date: Neuromuscular disorder (HCC)     Comment:  neuropathy No date: PONV (postoperative nausea and vomiting) No date: Skin cancer   Reproductive/Obstetrics negative OB ROS                             Anesthesia Physical Anesthesia Plan  ASA: 2  Anesthesia Plan: Spinal   Post-op Pain Management:    Induction: Intravenous  PONV Risk Score and Plan: 3 and Propofol infusion and TIVA  Airway Management Planned: Natural Airway and Simple Face Mask  Additional Equipment:   Intra-op Plan:   Post-operative Plan:   Informed Consent: I have reviewed the patients History and Physical, chart, labs and discussed the procedure including the risks, benefits and alternatives for the proposed anesthesia with the  patient or authorized representative who has indicated his/her understanding and acceptance.     Dental Advisory Given  Plan Discussed with: Anesthesiologist, CRNA and Surgeon  Anesthesia Plan Comments:         Anesthesia Quick Evaluation

## 2022-07-04 NOTE — Op Note (Signed)
07/04/2022  9:36 AM  PATIENT:  Jodi [name]   MRN: [id number]  PRE-OPERATIVE DIAGNOSIS:  Primary localized osteoarthritis of left knee   POST-OPERATIVE DIAGNOSIS:  Same   PROCEDURE:  Procedure(s): Left TOTAL KNEE ARTHROPLASTY   SURGEON: Laurene Footman, MD   ASSISTANTS: Rachelle Hora, PA-C   ANESTHESIA:   spinal   EBL: 200   BLOOD ADMINISTERED:none   DRAINS:  Incisional wound VAC     LOCAL MEDICATIONS USED:  MARCAINE    and OTHER morphine and Exparel   SPECIMEN:  No Specimen   DISPOSITION OF SPECIMEN:  N/A   COUNTS:  YES   TOURNIQUET: 27 minutes at 300 mm Hg   IMPLANTS: Medacta  GMK  system with 3 left femur, 2 left tibia with short stem and 10 mm PS insert.  Size 2 patella, all components cemented.   DICTATION: Viviann Spare Dictation   patient was brought to the operating room and spinal anesthesia was obtained.  After prepping and draping the left leg in sterile fashion, and after patient identification and timeout procedures were completed, a midline skin incision was made followed by medial parapatellar arthrotomy with mild medial compartment osteoarthritis, severe patellofemoral arthritis and severe lateral compartment arthritis, partial synovectomy was also carried out.   The ACL and PCL and fat pad were excised along with anterior horns of the meniscus. The proximal tibia cutting guide from  the extra medullary system was applied and the proximal tibia cut carried out.  The distal femoral cut was carried out in a similar fashion using an intramedullary guide the femur then measured a size 3.     The 3 femoral cutting guide applied with anterior posterior and chamfer cuts made.  The posterior horns of the menisci were removed at this point.   Injection of the above medication was carried out after the femoral and tibial cuts were carried out.  The 2 baseplate trial was placed pinned into position and proximal tibial preparation carried out with drilling hand reaming and the  keel punch followed by placement of the 3 femur and sizing the tibial insert size 10 millimeter gave the best fit with stability and full extension.  The distal femoral drill holes were made in the notch cut for the trochlear groove was then carried out with PS hollow drill also utilized with trials were then removed the patella was cut using the patellar cutting guide and it sized to a size 2 after drill holes have been made tourniquet was elevated at this time the knee was irrigated with pulsatile lavage and the bony surfaces dried the tibial component was cemented into place first.  Excess cement was removed and the polyethylene insert placed with a torque screw placed with a torque screwdriver tightened.  The distal femoral component was placed and the knee was held in extension as the patellar button was clamped into place.  After the cement was set, excess cement was removed and the knee was again irrigated thoroughly thoroughly irrigated.  The tourniquet was let down and hemostasis checked with electrocautery. The arthrotomy was repaired with a heavy Quill suture,  followed by 3-0 V lock subcuticular closure, skin staples followed by incisional wound VAC and Polar Care.Marland Kitchen   PLAN OF CARE: Admit for overnight observation   PATIENT DISPOSITION:  PACU - hemodynamically stable.

## 2022-07-04 NOTE — Transfer of Care (Signed)
Immediate Anesthesia Transfer of Care Note  Patient: Jodi [name]  Procedure(s) Performed: TOTAL KNEE ARTHROPLASTY (Left: Knee)  Patient Location: PACU  Anesthesia Type:Spinal  Level of Consciousness: awake, drowsy and patient cooperative  Airway & Oxygen Therapy: Patient Spontanous Breathing and Patient connected to face mask oxygen  Post-op Assessment: Report given to RN and Post -op Vital signs reviewed and stable  Post vital signs: Reviewed and stable  Last Vitals:  Vitals Value Taken Time  BP 104/44 07/04/22 0930  Temp 36.3 C 07/04/22 0925  Pulse 66 07/04/22 0932  Resp 11 07/04/22 0932  SpO2 99 % 07/04/22 0932  Vitals shown include unvalidated device data.  Last Pain:  Vitals:   07/04/22 0622  TempSrc: Oral         Complications: No notable events documented.

## 2022-07-04 NOTE — TOC Initial Note (Signed)
Transition of Care [facility]) - Initial/Assessment Note    Patient Details  Name: Jodi [name] MRN: [id number] Date of Birth: October 13, 1941  Transition of Care [facility]) CM/SW Contact:    Conception Oms, RN Phone Number: 07/04/2022, 9:04 AM  Clinical Narrative:                  Patient was accepted by [facility] for [facility] services       Patient Goals and CMS Choice        Expected Discharge Plan and Services                                                Prior Living Arrangements/Services                       Activities of Daily Living Home Assistive Devices/Equipment: Eyeglasses, Gilford Rile (specify type) ADL Screening (condition at time of admission) Patient's cognitive ability adequate to safely complete daily activities?: No Is the patient deaf or have difficulty hearing?: No Does the patient have difficulty seeing, even when wearing glasses/contacts?: No Does the patient have difficulty concentrating, remembering, or making decisions?: No Patient able to express need for assistance with ADLs?: Yes Does the patient have difficulty dressing or bathing?: No Independently performs ADLs?: Yes (appropriate for developmental age) Does the patient have difficulty walking or climbing stairs?: Yes Weakness of Legs: Left Weakness of Arms/Hands: None  Permission Sought/Granted                  Emotional Assessment              Admission diagnosis:  Primary osteoarthritis of left knee M17.12 Patient Active Problem List   Diagnosis Date Noted   DIVERTICULITIS, COLON 08/15/2009   ACUTE AND CHRONIC CHOLECYSTITIS 08/14/2009   LIVER FUNCTION TESTS, ABNORMAL, HX OF 08/14/2009   ABDOMINAL PAIN, RIGHT UPPER QUADRANT, HX OF 08/14/2009   PCP:  Juluis Pitch, MD Pharmacy:   [facility] [place] [address] Phone: [phone] Fax: [phone]  [place] [place] [address] [place] [address] Phone: [phone] Fax: [phone]  [facility] [place], [place] - [address] [place] [address] Phone: [phone] Fax: [phone]     Social Determinants of Health (SDOH) Interventions    Readmission Risk Interventions     No data to display

## 2022-07-04 NOTE — Anesthesia Procedure Notes (Signed)
Spinal  Patient location during procedure: OR Start time: 07/04/2022 7:32 AM End time: 07/04/2022 7:39 AM Reason for block: surgical anesthesia Staffing Performed: resident/CRNA  Anesthesiologist: Martha Clan, MD Resident/CRNA: Lowry Bowl, CRNA Performed by: Lowry Bowl, CRNA Authorized by: Martha Clan, MD   Preanesthetic Checklist Completed: patient identified, IV checked, site marked, risks and benefits discussed, surgical consent, monitors and equipment checked, pre-op evaluation and timeout performed Spinal Block Patient position: sitting Prep: DuraPrep Patient monitoring: heart rate, cardiac monitor, continuous pulse ox and blood pressure Approach: midline Location: L3-4 Injection technique: single-shot Needle Needle type: Sprotte  Needle gauge: 24 G Needle length: 9 cm Assessment Sensory level: T4 Events: CSF return Additional Notes Orland Mustard SRNA performed spinal

## 2022-07-04 NOTE — Evaluation (Signed)
Physical Therapy Evaluation Patient Details Name: Jodi [name] MRN: [id number] DOB: 10-09-1941 Today's Date: 07/04/2022  [name]  Pt is a 81 yo F s/p L TKA. PMH includes GERD, anxiety, hypothryroidism, HLD, neuropathy  Clinical Impression  Pt was pleasant and slightly lethargic but able to participate during the session and put forth good effort throughout. Pt able to complete bed mobility w/ CGA with extra time and effort to complete; slow moving due to pain. Pt able to perform sit to stand from slight elevated bed w/ minA for steadying when transitioning LUE to RW; cuing needed for hand and foot placement. Pt able to complete steps forwards/backwards and sidestep toward [facility] following cuing for sequencing; very hesitant w/ wbing on LLE. When first sitting and standing up pt reports some dizziness with no increases as session progressed; SpO2 and HR WNL on room air with no other adverse symptoms reported/observed. Pt reports she feels tired/dizziness from all the medication she was given prior. Pt will benefit from HHPT upon discharge to safely address deficits listed in patient problem list for decreased caregiver assistance and eventual return to PLOF.       Recommendations for follow up therapy are one component of a multi-disciplinary discharge planning process, led by the attending physician.  Recommendations may be updated based on patient status, additional functional criteria and insurance authorization.  Follow Up Recommendations Home health PT      Assistance Recommended at Discharge Intermittent Supervision/Assistance  Patient can return home with the following  A little help with walking and/or transfers;A little help with bathing/dressing/bathroom;Assistance with cooking/housework;Assist for transportation;Help with stairs or ramp for entrance    Equipment Recommendations Rolling walker (2 wheels)  Recommendations for Other Services       Functional  Status Assessment Patient has had a recent decline in their functional status and demonstrates the ability to make significant improvements in function in a reasonable and predictable amount of time.     Precautions / Restrictions Precautions Precautions: Knee;Fall Precaution Booklet Issued: Yes (comment) Restrictions Weight Bearing Restrictions: Yes      Mobility  Bed Mobility Overal bed mobility: Needs Assistance Bed Mobility: Supine to Sit, Sit to Supine     Supine to sit: Min guard Sit to supine: Min guard   General bed mobility comments: effortful but able to complete with extra time    Transfers Overall transfer level: Needs assistance Equipment used: Rolling walker (2 wheels) Transfers: Sit to/from Stand Sit to Stand: From elevated surface, Min assist           General transfer comment: minA needed for steadying when transition LUE to RW    Ambulation/Gait Ambulation/Gait assistance: Min guard Gait Distance (Feet): 2 Feet Assistive device: Rolling walker (2 wheels) Gait Pattern/deviations: Step-to pattern, Decreased step length - right, Decreased step length - left Gait velocity: decreased     General Gait Details: able to complete sidestep to [facility] and a couple steps forwards/backwards with effort and cuing for sequencing  Stairs            Wheelchair Mobility    Modified Rankin (Stroke Patients Only)       Balance Overall balance assessment: Needs assistance Sitting-balance support: Bilateral upper extremity supported, Feet supported Sitting balance-Leahy Scale: Good     Standing balance support: Bilateral upper extremity supported, During functional activity, Reliant on assistive device for balance Standing balance-Leahy Scale: Fair  Pertinent Vitals/Pain Pain Assessment Pain Assessment: 0-10 Pain Score: 7  Pain Location: L knee Pain Descriptors / Indicators: Aching, Discomfort,  Grimacing Pain Intervention(s): Monitored during session, Premedicated before session, Repositioned, Ice applied    Home Living Family/patient expects to be discharged to:: Private residence Living Arrangements: Spouse/significant other;Children (husband) Available Help at Discharge: Family;Available 24 hours/day Type of Home: House Home Access: Stairs to enter Entrance Stairs-Rails: None Entrance Stairs-Number of Steps: 1/2 step threshold Alternate Level Stairs-Number of Steps: has basement w/ chair lift Home Layout: Two level Home Equipment: Rollator (4 wheels);Shower seat      Prior Function Prior Level of Function : Independent/Modified Independent             Mobility Comments: Ind limited community ambulator using rollator, no hx of falls but reports having near falls ADLs Comments: Ind w/ ADLs; some assist with cooking     Hand Dominance        Extremity/Trunk Assessment   Upper Extremity Assessment Upper Extremity Assessment: Overall WFL for tasks assessed    Lower Extremity Assessment Lower Extremity Assessment: Generalized weakness;LLE deficits/detail LLE Deficits / Details: unable to perform SLR       Communication   Communication: No difficulties  Cognition Arousal/Alertness: Awake/alert Behavior During Therapy: WFL for tasks assessed/performed Overall Cognitive Status: Within Functional Limits for tasks assessed                                          General Comments      Exercises Total Joint Exercises Ankle Circles/Pumps: Strengthening, 10 reps, Both Quad Sets: Strengthening, Both, 10 reps Gluteal Sets: Strengthening, Both, 10 reps Long Arc Quad: AAROM, Left, 10 reps Goniometric ROM: L Knee AROM 6-70 degrees Other Exercises Other Exercises: HEP education per handout   Assessment/Plan    PT Assessment Patient needs continued PT services  PT Problem List Decreased strength;Decreased mobility;Decreased range of  motion;Decreased activity tolerance;Decreased balance;Decreased knowledge of use of DME       PT Treatment Interventions DME instruction;Therapeutic exercise;Balance training;Gait training;Stair training;Functional mobility training;Therapeutic activities;Patient/family education    PT Goals (Current goals can be found in the Care Plan section)  Acute Rehab PT Goals Patient Stated Goal: Pt would like to get back to walking and increase strength/endurance PT Goal Formulation: With patient Time For Goal Achievement: 07/17/22 Potential to Achieve Goals: Good    Frequency BID     Co-evaluation               AM-PAC PT "6 Clicks" Mobility  Outcome Measure Help needed turning from your back to your side while in a flat bed without using bedrails?: A Little Help needed moving from lying on your back to sitting on the side of a flat bed without using bedrails?: A Little Help needed moving to and from a bed to a chair (including a wheelchair)?: A Little Help needed standing up from a chair using your arms (e.g., wheelchair or bedside chair)?: A Little Help needed to walk in hospital room?: A Little Help needed climbing 3-5 steps with a railing? : A Lot 6 Click Score: 17    End of Session Equipment Utilized During Treatment: Gait belt Activity Tolerance: Patient tolerated treatment well Patient left: in bed;with call bell/phone within reach;with bed alarm set Nurse Communication: Mobility status PT Visit Diagnosis: Other abnormalities of gait and mobility (R26.89);Muscle weakness (generalized) (M62.81);Pain Pain -  Right/Left: Left Pain - part of body: Knee    Time: 3202-3343 PT Time Calculation (min) (ACUTE ONLY): 42 min   Charges:              Turner Daniels, SPT  07/04/2022, 4:39 PM

## 2022-07-05 ENCOUNTER — Encounter: Payer: Self-pay | Admitting: Orthopedic Surgery

## 2022-07-05 DIAGNOSIS — Z79899 Other long term (current) drug therapy: Secondary | ICD-10-CM | POA: Diagnosis not present

## 2022-07-05 DIAGNOSIS — Z9049 Acquired absence of other specified parts of digestive tract: Secondary | ICD-10-CM | POA: Diagnosis not present

## 2022-07-05 DIAGNOSIS — M21062 Valgus deformity, not elsewhere classified, left knee: Secondary | ICD-10-CM | POA: Diagnosis present

## 2022-07-05 DIAGNOSIS — Z90710 Acquired absence of both cervix and uterus: Secondary | ICD-10-CM | POA: Diagnosis not present

## 2022-07-05 DIAGNOSIS — M654 Radial styloid tenosynovitis [de Quervain]: Secondary | ICD-10-CM | POA: Diagnosis present

## 2022-07-05 DIAGNOSIS — M170 Bilateral primary osteoarthritis of knee: Secondary | ICD-10-CM | POA: Diagnosis present

## 2022-07-05 DIAGNOSIS — E785 Hyperlipidemia, unspecified: Secondary | ICD-10-CM | POA: Diagnosis present

## 2022-07-05 DIAGNOSIS — E039 Hypothyroidism, unspecified: Secondary | ICD-10-CM | POA: Diagnosis present

## 2022-07-05 DIAGNOSIS — Z885 Allergy status to narcotic agent status: Secondary | ICD-10-CM | POA: Diagnosis not present

## 2022-07-05 DIAGNOSIS — Z96652 Presence of left artificial knee joint: Secondary | ICD-10-CM | POA: Diagnosis present

## 2022-07-05 DIAGNOSIS — F419 Anxiety disorder, unspecified: Secondary | ICD-10-CM | POA: Diagnosis present

## 2022-07-05 DIAGNOSIS — E041 Nontoxic single thyroid nodule: Secondary | ICD-10-CM | POA: Diagnosis present

## 2022-07-05 DIAGNOSIS — Z79890 Hormone replacement therapy (postmenopausal): Secondary | ICD-10-CM | POA: Diagnosis not present

## 2022-07-05 DIAGNOSIS — G629 Polyneuropathy, unspecified: Secondary | ICD-10-CM | POA: Diagnosis present

## 2022-07-05 DIAGNOSIS — Z85828 Personal history of other malignant neoplasm of skin: Secondary | ICD-10-CM | POA: Diagnosis not present

## 2022-07-05 DIAGNOSIS — M479 Spondylosis, unspecified: Secondary | ICD-10-CM | POA: Diagnosis present

## 2022-07-05 LAB — BASIC METABOLIC PANEL
Anion gap: 3 — ABNORMAL LOW (ref 5–15)
BUN: 14 mg/dL (ref 8–23)
CO2: 27 mmol/L (ref 22–32)
Calcium: 8.5 mg/dL — ABNORMAL LOW (ref 8.9–10.3)
Chloride: 107 mmol/L (ref 98–111)
Creatinine, Ser: 0.9 mg/dL (ref 0.44–1.00)
GFR, Estimated: >60 mL/min (ref >60)
Glucose, Bld: 113 mg/dL — ABNORMAL HIGH (ref 70–99)
Potassium: 5.1 mmol/L (ref 3.5–5.1)
Sodium: 137 mmol/L (ref 135–145)

## 2022-07-05 LAB — CBC
HCT: 35.3 % — ABNORMAL LOW (ref 36.0–46.0)
Hemoglobin: 11.3 g/dL — ABNORMAL LOW (ref 12.0–15.0)
MCH: 29.9 pg (ref 26.0–34.0)
MCHC: 32 g/dL (ref 30.0–36.0)
MCV: 93.4 fL (ref 80.0–100.0)
Platelets: 216 10*3/uL (ref 150–400)
RBC: 3.78 MIL/uL — ABNORMAL LOW (ref 3.87–5.11)
RDW: 13 % (ref 11.5–15.5)
WBC: 7.7 10*3/uL (ref 4.0–10.5)
nRBC: 0 % (ref 0.0–0.2)

## 2022-07-05 MED ORDER — DOCUSATE SODIUM 100 MG PO CAPS: 100.0000 mg | ORAL_CAPSULE | Freq: Two times a day (BID) | ORAL | 0 refills | Status: DC

## 2022-07-05 MED ORDER — HYDROMORPHONE HCL 2 MG PO TABS: 2.0000 mg | ORAL_TABLET | ORAL | 0 refills | Status: DC | PRN

## 2022-07-05 MED ORDER — ENOXAPARIN SODIUM 40 MG/0.4ML IJ SOSY: 40.0000 mg | PREFILLED_SYRINGE | INTRAMUSCULAR | 0 refills | Status: DC

## 2022-07-05 MED ORDER — METHOCARBAMOL 500 MG PO TABS: 500.0000 mg | ORAL_TABLET | Freq: Four times a day (QID) | ORAL | 0 refills | Status: DC | PRN

## 2022-07-05 MED ORDER — HYDROCODONE-ACETAMINOPHEN 5-325 MG PO TABS: 1.0000 | ORAL_TABLET | ORAL | 0 refills | Status: DC | PRN

## 2022-07-05 NOTE — TOC Progression Note (Signed)
Transition of Care [facility]) - Progression Note    Patient Details  Name: Jodi [name] MRN: [id number] Date of Birth: Jun 22, 1941  [facility] CM/SW Dix, RN Phone Number: 07/05/2022, 1:36 PM  Clinical Narrative:    PASSR obtained, FL2 completed, Bedsearch sent   Expected Discharge Plan: [place] Barriers to Discharge: Barriers Resolved  Expected Discharge Plan and Services Expected Discharge Plan: [place]       Living arrangements for the past 2 months: Single Family Home                 DME Arranged: Walker rolling, 3-N-1 DME Agency: [employer] Date DME Agency Contacted: 07/05/22 Time DME Agency Contacted: [phone] Representative spoke with at DME Agency: [facility] HH Arranged: PT [place]: [place] ([place]) Date [place]: 07/05/22 Time [place]: [phone] Representative spoke with at [place]: [place] ([place]) Interventions    Readmission Risk Interventions     No data to display

## 2022-07-05 NOTE — Discharge Instructions (Signed)

## 2022-07-05 NOTE — Plan of Care (Signed)
°  Problem: Safety: °Goal: Ability to remain free from injury will improve °Outcome: Progressing °  °Problem: Pain Managment: °Goal: General experience of comfort will improve °Outcome: Not Progressing °  °

## 2022-07-05 NOTE — NC FL2 (Signed)
[place] LEVEL OF CARE SCREENING TOOL     IDENTIFICATION  Patient Name: Jodi [name] Birthdate: [birth year] Sex: female Admission Date (Current Location): 07/04/2022  [facility] and [place] Number:  Engineering geologist and Address:  [facility], [address] [place], [place] [address]      Provider Number: [id number]  Attending Physician Name and Address:  Hessie Knows, MD  Relative Name and Phone Number:  Tillman Sers [phone]    Current Level of Care: Hospital Recommended Level of Care: [place] Prior Approval Number:    Date Approved/Denied:   PASRR Number: [id number]  Discharge Plan: SNF    Current Diagnoses: Patient Active Problem List   Diagnosis Date Noted   S/P TKR (total knee replacement) using cement, left 07/04/2022   DIVERTICULITIS, COLON 08/15/2009   ACUTE AND CHRONIC CHOLECYSTITIS 08/14/2009   LIVER FUNCTION TESTS, ABNORMAL, HX OF 08/14/2009   ABDOMINAL PAIN, RIGHT UPPER QUADRANT, HX OF 08/14/2009    Orientation RESPIRATION BLADDER Height & Weight     Self, Time, Situation, Place  Normal Continent, External catheter Weight: 85.2 kg Height:  '5\' 2"'$  (157.5 cm)  BEHAVIORAL SYMPTOMS/MOOD NEUROLOGICAL BOWEL NUTRITION STATUS      Continent Diet (see DC summary)  AMBULATORY STATUS COMMUNICATION OF NEEDS Skin   Extensive Assist Verbally Normal, Surgical wounds                       Personal Care Assistance Level of Assistance  Bathing, Feeding, Dressing Bathing Assistance: Limited assistance Feeding assistance: Independent Dressing Assistance: Maximum assistance     Functional Limitations Info             SPECIAL CARE FACTORS FREQUENCY  PT (By licensed PT), OT (By licensed OT)     PT Frequency: 5 times per week OT Frequency: 5 times per week            Contractures Contractures Info: Not present    Additional Factors Info  Code Status, Allergies Code Status Info:  full code Allergies Info: Lyrica (Pregabalin), Oxycodone-acetaminophen           Current Medications (07/05/2022):  This is the current hospital active medication list Current Facility-Administered Medications  Medication Dose Route Frequency Provider Last Rate Last Admin   0.9 %  sodium chloride infusion   Intravenous Continuous Hessie Knows, MD   Stopped at 07/05/22 0229   acetaminophen (TYLENOL) tablet 325-650 mg  325-650 mg Oral Q6H PRN Hessie Knows, MD       acidophilus (RISAQUAD) capsule 1 capsule  1 capsule Oral Daily Hessie Knows, MD   1 capsule at 07/05/22 0850   alum & mag hydroxide-simeth (MAALOX/MYLANTA) 200-200-20 MG/5ML suspension 30 mL  30 mL Oral Q4H PRN Hessie Knows, MD       bisacodyl (DULCOLAX) EC tablet 5 mg  5 mg Oral Daily PRN Hessie Knows, MD       calcium carbonate (TUMS - dosed in mg elemental calcium) chewable tablet 400 mg of elemental calcium  2 tablet Oral PRN Hessie Knows, MD       cycloSPORINE (RESTASIS) 0.05 % ophthalmic emulsion 1 drop  1 drop Both Eyes BID Hessie Knows, MD   1 drop at 07/05/22 0851   diphenhydrAMINE (BENADRYL) 12.5 MG/5ML elixir 12.5-25 mg  12.5-25 mg Oral Q4H PRN Hessie Knows, MD       docusate sodium (COLACE) capsule 100 mg  100 mg Oral BID Hessie Knows, MD  100 mg at 07/05/22 0850   enoxaparin (LOVENOX) injection 30 mg  30 mg Subcutaneous Q12H Hessie Knows, MD   30 mg at 07/05/22 0850   gabapentin (NEURONTIN) capsule 300 mg  300 mg Oral TID Hessie Knows, MD   300 mg at 07/05/22 0850   HYDROcodone-acetaminophen (NORCO/VICODIN) 5-325 MG per tablet 1-2 tablet  1-2 tablet Oral Q4H PRN Hessie Knows, MD   1 tablet at 07/05/22 0849   HYDROmorphone (DILAUDID) injection 1 mg  1 mg Intravenous Q2H PRN Hessie Knows, MD   1 mg at 07/04/22 1318   HYDROmorphone (DILAUDID) tablet 2 mg  2 mg Oral Q2H PRN Hessie Knows, MD   2 mg at 07/05/22 0951   levothyroxine (SYNTHROID) tablet 88 mcg  88 mcg Oral Q0600 Hessie Knows, MD   88 mcg at  07/05/22 0537   magnesium oxide (MAG-OX) tablet 400 mg  400 mg Oral Daily Hessie Knows, MD   400 mg at 07/05/22 0850   melatonin tablet 5 mg  5 mg Oral QHS Hessie Knows, MD       menthol-cetylpyridinium (CEPACOL) lozenge 3 mg  1 lozenge Oral PRN Hessie Knows, MD       Or   phenol (CHLORASEPTIC) mouth spray 1 spray  1 spray Mouth/Throat PRN Hessie Knows, MD       methocarbamol (ROBAXIN) tablet 500 mg  500 mg Oral Q6H PRN Hessie Knows, MD   500 mg at 07/04/22 1307   Or   methocarbamol (ROBAXIN) 500 mg in dextrose 5 % 50 mL IVPB  500 mg Intravenous Q6H PRN Hessie Knows, MD       metoCLOPramide (REGLAN) tablet 5-10 mg  5-10 mg Oral Q8H PRN Hessie Knows, MD       Or   metoCLOPramide (REGLAN) injection 5-10 mg  5-10 mg Intravenous Q8H PRN Hessie Knows, MD       multivitamin with minerals tablet 1 tablet  1 tablet Oral Daily Hessie Knows, MD   1 tablet at 07/05/22 0850   ondansetron (ZOFRAN) tablet 4 mg  4 mg Oral Q6H PRN Hessie Knows, MD   4 mg at 07/04/22 2226   Or   ondansetron (ZOFRAN) injection 4 mg  4 mg Intravenous Q6H PRN Hessie Knows, MD       pantoprazole (PROTONIX) EC tablet 40 mg  40 mg Oral Daily Hessie Knows, MD   40 mg at 07/05/22 0850   senna-docusate (Senokot-S) tablet 1 tablet  1 tablet Oral QHS PRN Hessie Knows, MD       simethicone [facility]) chewable tablet 80 mg  80 mg Oral Daily Hessie Knows, MD   80 mg at 07/05/22 0851   sodium phosphate (FLEET) 7-19 GM/118ML enema 1 enema  1 enema Rectal Once PRN Hessie Knows, MD       traMADol Veatrice Bourbon) tablet 50 mg  50 mg Oral Q6H Hessie Knows, MD   50 mg at 07/05/22 1209   vitamin B-12 (CYANOCOBALAMIN) tablet 1,000 mcg  1,000 mcg Oral Daily Hessie Knows, MD   1,000 mcg at 07/05/22 0850   zolpidem (AMBIEN) tablet 5 mg  5 mg Oral QHS PRN Hessie Knows, MD         Discharge Medications: Please see discharge summary for a list of discharge medications.  Relevant Imaging Results:  Relevant Lab Results:   Additional  Information SS# [id number]  Conception Oms, RN

## 2022-07-05 NOTE — Progress Notes (Signed)
Physical Therapy Treatment Patient Details Name: Jodi [name] MRN: [id number] DOB: [birth year] Today's Date: 07/05/2022   [name] Pt is a 81 yo F s/p L TKA. PMH includes GERD, anxiety, hypothryroidism, HLD, neuropathy    PT Comments    Pt somewhat lethargic but able to participate with therapy. Pain continues to be a very limiting factor despite being premedicated. Pt able to complete supine<>sit w/ minA for trunk control and leg management; increased effort and time needed with pauses for pain management; sit<>supine requiring modA for leg management. Pt able to complete sit to stand from elevated bed with modA for steadying once up, however, was able to come to stand w/o physical assist. Unable to come fully upright and very reliant on UE support with heavy R sided lean and very hesitant to WB on LLE. Attempted to side step toward [facility] but pt unable to advance LE's despite heavy cuing. Pt will benefit from PT services in a SNF setting upon discharge to safely address deficits listed in patient problem list for decreased caregiver assistance and eventual return to PLOF.   Recommendations for follow up therapy are one component of a multi-disciplinary discharge planning process, led by the attending physician.  Recommendations may be updated based on patient status, additional functional criteria and insurance authorization.  Follow Up Recommendations  Skilled nursing-short term rehab (<3 hours/day) Can patient physically be transported by private vehicle: No   Assistance Recommended at Discharge Frequent or constant Supervision/Assistance  Patient can return home with the following A little help with bathing/dressing/bathroom;Assistance with cooking/housework;Assist for transportation;Help with stairs or ramp for entrance;A lot of help with walking and/or transfers   Equipment Recommendations  Rolling walker (2 wheels)    Recommendations for Other Services        Precautions / Restrictions Precautions Precautions: Knee;Fall Precaution Booklet Issued: Yes (comment) Restrictions Weight Bearing Restrictions: Yes RLE Weight Bearing: Weight bearing as tolerated LLE Weight Bearing: Weight bearing as tolerated     Mobility  Bed Mobility Overal bed mobility: Needs Assistance Bed Mobility: Sit to Supine     Supine to sit: Min assist Sit to supine: Mod assist   General bed mobility comments: able to move from supine<>sit with minA for leg management and trunk control with increased effort and time. sit<>supine needing modA for leg management    Transfers Overall transfer level: Needs assistance Equipment used: Rolling walker (2 wheels) Transfers: Sit to/from Stand Sit to Stand: Mod assist, From elevated surface           General transfer comment: pt able to initiate stand from elevated surface following cuing for hand placement. very effortful to complete and once up needing modA for steadying. unable to come fully upright    Ambulation/Gait               General Gait Details: attempted sidestepping toward [facility] but unable; very hesitant to WB on LLE   Stairs             Wheelchair Mobility    Modified Rankin (Stroke Patients Only)       Balance Overall balance assessment: Needs assistance Sitting-balance support: Feet supported, Bilateral upper extremity supported Sitting balance-Leahy Scale: Fair     Standing balance support: Bilateral upper extremity supported, During functional activity, Reliant on assistive device for balance Standing balance-Leahy Scale: Poor  Cognition Arousal/Alertness: Awake/alert Behavior During Therapy: WFL for tasks assessed/performed Overall Cognitive Status: Within Functional Limits for tasks assessed                                          Exercises    General Comments        Pertinent Vitals/Pain Pain  Assessment Pain Assessment: 0-10 Pain Score: 9  Pain Location: L knee. Pain Descriptors / Indicators: Aching, Discomfort, Grimacing Pain Intervention(s): Monitored during session, Premedicated before session, Repositioned, Ice applied    Home Living                          Prior Function            PT Goals (current goals can now be found in the care plan section) Progress towards PT goals: Progressing toward goals    Frequency    BID      PT Plan Current plan remains appropriate    Co-evaluation              AM-PAC PT "6 Clicks" Mobility   Outcome Measure  Help needed turning from your back to your side while in a flat bed without using bedrails?: A Little Help needed moving from lying on your back to sitting on the side of a flat bed without using bedrails?: A Little Help needed moving to and from a bed to a chair (including a wheelchair)?: A Lot Help needed standing up from a chair using your arms (e.g., wheelchair or bedside chair)?: A Lot Help needed to walk in hospital room?: A Lot Help needed climbing 3-5 steps with a railing? : Total 6 Click Score: 13    End of Session Equipment Utilized During Treatment: Gait belt Activity Tolerance: Patient limited by pain Patient left: in bed;with call bell/phone within reach;with bed alarm set;with family/visitor present; w/ polar care Nurse Communication: Mobility status;  PT Visit Diagnosis: Other abnormalities of gait and mobility (R26.89);Muscle weakness (generalized) (M62.81);Pain Pain - Right/Left: Left Pain - part of body: Knee     Time: 1430-1500 PT Time Calculation (min) (ACUTE ONLY): 30 min  Charges:        Turner Daniels, SPT  07/05/2022, 3:58 PM

## 2022-07-05 NOTE — Plan of Care (Signed)
  Problem: Health Behavior/Discharge Planning: Goal: Ability to manage health-related needs will improve Outcome: Progressing   Problem: Clinical Measurements: Goal: Will remain free from infection Outcome: Progressing   Problem: Clinical Measurements: Goal: Respiratory complications will improve Outcome: Progressing   Problem: Clinical Measurements: Goal: Cardiovascular complication will be avoided Outcome: Progressing   Problem: Activity: Goal: Risk for activity intolerance will decrease Outcome: Progressing   Problem: Nutrition: Goal: Adequate nutrition will be maintained Outcome: Progressing   Problem: Coping: Goal: Level of anxiety will decrease Outcome: Progressing   Problem: Elimination: Goal: Will not experience complications related to urinary retention Outcome: Progressing   Problem: Pain Managment: Goal: General experience of comfort will improve Outcome: Progressing   Problem: Safety: Goal: Ability to remain free from injury will improve Outcome: Progressing   Problem: Skin Integrity: Goal: Risk for impaired skin integrity will decrease Outcome: Progressing

## 2022-07-05 NOTE — Progress Notes (Signed)
Physical Therapy Treatment Patient Details Name: Jodi [name] MRN: [id number] DOB: [birth year] Today's Date: 07/05/2022   [name] Pt is a 81 yo F s/p L TKA. PMH includes GERD, anxiety, hypothryroidism, HLD, neuropathy    PT Comments    Session very limited due to pt in severe pain and needing encouragement to participate w/ therapy. Pt was very guarded with movements using LLE with minimal ability to initiate. Majority of session spent doing very gentle AAROM w/ LLE. Pt was able to sit EOB but very effortful and needing minA for trunk control and leg management. Pt was only able to tolerate sitting EOB ~56mn before reporting feeling faint and needing to lay down. Pt required +2 modA moving sit<>supine for leg management and trunk control. Pt will benefit from PT services in a SNF setting upon discharge to safely address deficits listed in patient problem list for decreased caregiver assistance and eventual return to PLOF.   Recommendations for follow up therapy are one component of a multi-disciplinary discharge planning process, led by the attending physician.  Recommendations may be updated based on patient status, additional functional criteria and insurance authorization.  Follow Up Recommendations  Skilled nursing-short term rehab (<3 hours/day) Can patient physically be transported by private vehicle: No   Assistance Recommended at Discharge Frequent or constant Supervision/Assistance  Patient can return home with the following A little help with walking and/or transfers;A little help with bathing/dressing/bathroom;Assistance with cooking/housework;Assist for transportation;Help with stairs or ramp for entrance   Equipment Recommendations  Rolling walker (2 wheels)    Recommendations for Other Services       Precautions / Restrictions Precautions Precautions: Knee;Fall Precaution Booklet Issued: Yes (comment) Restrictions Weight Bearing Restrictions:  Yes LLE Weight Bearing: Weight bearing as tolerated     Mobility  Bed Mobility   Bed Mobility: Sit to Supine     Supine to sit: Min assist Sit to supine: Mod assist, +2 for physical assistance   General bed mobility comments: able to move from supine<>sit with minA for leg management and trunk control with increased effort and time. sit<>supine needing +2 modA    Transfers                   General transfer comment: not attempted due to pain; pt reported feeling faint and needing to lay down    Ambulation/Gait                   Stairs             Wheelchair Mobility    Modified Rankin (Stroke Patients Only)       Balance Overall balance assessment: Needs assistance Sitting-balance support: Bilateral upper extremity supported, Feet supported Sitting balance-Leahy Scale: Fair                                      Cognition Arousal/Alertness: Awake/alert Behavior During Therapy: WFL for tasks assessed/performed Overall Cognitive Status: Within Functional Limits for tasks assessed                                          Exercises General Exercises - Lower Extremity Ankle Circles/Pumps: Strengthening, Both, 10 reps Quad Sets: Strengthening, Both, 10 reps Long Arc Quad: AAROM, Left, 10 reps Heel Slides: AAROM, Left, 10  reps    General Comments      Pertinent Vitals/Pain Pain Assessment Pain Assessment: 0-10 Pain Score:  (number not given) Pain Location: L knee. Pain Descriptors / Indicators: Aching, Discomfort, Grimacing Pain Intervention(s): Monitored during session, Limited activity within patient's tolerance, Premedicated before session, Repositioned, Ice applied    Home Living Family/patient expects to be discharged to:: Private residence Living Arrangements: Spouse/significant other;Children Available Help at Discharge: Family;Available 24 hours/day Type of Home: House Home Access: Stairs to  enter Entrance Stairs-Rails: None Entrance Stairs-Number of Steps: 1/2 step threshold Alternate Level Stairs-Number of Steps: has basement w/ chair lift Home Layout: Two level Home Equipment: Rollator (4 wheels);Shower seat Additional Comments: daughter works, spouse (uses [phone])  home 24/7    Prior Function            PT Goals (current goals can now be found in the care plan section) Progress towards PT goals: Progressing toward goals    Frequency    BID      PT Plan Discharge plan needs to be updated    Co-evaluation              AM-PAC PT "6 Clicks" Mobility   Outcome Measure  Help needed turning from your back to your side while in a flat bed without using bedrails?: A Little Help needed moving from lying on your back to sitting on the side of a flat bed without using bedrails?: A Little Help needed moving to and from a bed to a chair (including a wheelchair)?: A Lot Help needed standing up from a chair using your arms (e.g., wheelchair or bedside chair)?: A Lot Help needed to walk in hospital room?: A Lot Help needed climbing 3-5 steps with a railing? : A Lot 6 Click Score: 14    End of Session Equipment Utilized During Treatment: Gait belt Activity Tolerance: Patient tolerated treatment well Patient left: in bed;with call bell/phone within reach;with bed alarm set Nurse Communication: Mobility status PT Visit Diagnosis: Other abnormalities of gait and mobility (R26.89);Muscle weakness (generalized) (M62.81);Pain Pain - Right/Left: Left Pain - part of body: Knee     Time: 3664-4034 PT Time Calculation (min) (ACUTE ONLY): 32 min  Charges:                        Turner Daniels, SPT  07/05/2022, 12:17 PM

## 2022-07-05 NOTE — Progress Notes (Signed)
   Subjective: 1 Day Post-Op Procedure(s) (LRB): TOTAL KNEE ARTHROPLASTY (Left) Patient reports pain as severe.   Patient is well, and has had no acute complaints or problems Denies any CP, SOB, ABD pain. We will continue therapy today.    Objective: Vital signs in last 24 hours: Temp:  [97 F (36.1 C)-98.4 F (36.9 C)] 98.4 F (36.9 C) (07/14 0332) Pulse Rate:  [59-88] 88 (07/14 0332) Resp:  [10-28] 16 (07/14 0332) BP: (87-123)/(39-90) 123/60 (07/14 0332) SpO2:  [92 %-100 %] 94 % (07/13 1935)  Intake/Output from previous day: 07/13 0701 - 07/14 0700 In: 2685.8 [P.O.:240; I.V.:1945.8; IV Piggyback:500] Out: 1400 [Urine:1350; Blood:50] Intake/Output this shift: No intake/output data recorded.  Recent Labs    07/04/22 1227 07/05/22 0617  HGB 12.5 11.3*   Recent Labs    07/04/22 1227 07/05/22 0617  WBC 9.2 7.7  RBC 4.15 3.78*  HCT 39.2 35.3*  PLT 212 216   Recent Labs    07/04/22 1227 07/05/22 0617  NA  --  137  K  --  5.1  CL  --  107  CO2  --  27  BUN  --  14  CREATININE 0.93 0.90  GLUCOSE  --  113*  CALCIUM  --  8.5*   No results for input(s): "LABPT", "INR" in the last 72 hours.  EXAM General - Patient is Alert, Appropriate, and Oriented Extremity - Neurovascular intact Sensation intact distally Intact pulses distally Dorsiflexion/Plantar flexion intact Dressing - dressing C/D/I and no drainage, provena intact with out drainage Motor Function - intact, moving foot and toes well on exam.   Past Medical History:  Diagnosis Date   Anxiety    Hyperlipidemia    Hypothyroidism    Neuromuscular disorder (HCC)    neuropathy   PONV (postoperative nausea and vomiting)    Skin cancer     Assessment/Plan:   1 Day Post-Op Procedure(s) (LRB): TOTAL KNEE ARTHROPLASTY (Left) Principal Problem:   S/P TKR (total knee replacement) using cement, left  Estimated body mass index is 34.35 kg/m as calculated from the following:   Height as of this  encounter: '5\' 2"'$  (1.575 m).   Weight as of this encounter: 85.2 kg. Advance diet Up with therapy Vital signs stable Labs are stable Pain severe.  Patient without pain medication since 5:30 PM yesterday.  Continue with current pain regimen as needed Care management to assist with discharge, anticipate discharge to skilled nursing facility pending PT recommendation    DVT Prophylaxis - Lovenox, TED hose, and SCDs Weight-Bearing as tolerated to left leg   T. Rachelle Hora, PA-C [place] 07/05/2022, 7:49 AM

## 2022-07-05 NOTE — Progress Notes (Signed)
Spoke to the patient  She lives at home with her husband He provides transportation She has a Corporate investment banker and a shower seat at home and needs a rolling walker and a 3 in1 [employer] to deliver to the bedside She can afford her medication She is set up with [facility]

## 2022-07-05 NOTE — Anesthesia Postprocedure Evaluation (Signed)
Anesthesia Post Note  Patient: Jodi [name]  Procedure(s) Performed: TOTAL KNEE ARTHROPLASTY (Left: Knee)  Patient location during evaluation: Nursing Unit Anesthesia Type: Spinal Level of consciousness: awake and alert, oriented and patient cooperative Pain management: pain level not controlled Respiratory status: spontaneous breathing Cardiovascular status: stable and blood pressure returned to baseline Postop Assessment: no headache, no backache and no apparent nausea or vomiting Anesthetic complications: no   No notable events documented.   Last Vitals:  Vitals:   07/04/22 1935 07/05/22 0332  BP: 91/74 123/60  Pulse: 74 88  Resp: 16 16  Temp: 36.7 C 36.9 C  SpO2: 94%     Last Pain:  Vitals:   07/04/22 2010  TempSrc:   PainSc: 2                  [name] D Mosi Hannold

## 2022-07-05 NOTE — Discharge Summary (Signed)
Physician Discharge Summary  Patient ID: Jodi [name] MRN: [id number] DOB/AGE: [birth year] 81 y.o.  Admit date: 07/04/2022 Discharge date:***  Admission Diagnoses:  S/P TKR (total knee replacement) using cement, left [Z96.652]   Discharge Diagnoses: Patient Active Problem List   Diagnosis Date Noted   S/P TKR (total knee replacement) using cement, left 07/04/2022   DIVERTICULITIS, COLON 08/15/2009   ACUTE AND CHRONIC CHOLECYSTITIS 08/14/2009   LIVER FUNCTION TESTS, ABNORMAL, HX OF 08/14/2009   ABDOMINAL PAIN, RIGHT UPPER QUADRANT, HX OF 08/14/2009    Past Medical History:  Diagnosis Date   Anxiety    Hyperlipidemia    Hypothyroidism    Neuromuscular disorder ([place])    neuropathy   PONV (postoperative nausea and vomiting)    Skin cancer      Transfusion: none   Consultants (if any):   Discharged Condition: Improved  Hospital Course: Jodi [name] is an 81 y.o. female who was admitted 07/04/2022 with a diagnosis of S/P TKR (total knee replacement) using cement, left and went to the operating room on 07/04/2022 and underwent the above named procedures.    Surgeries: Procedure(s): TOTAL KNEE ARTHROPLASTY on 07/04/2022 Patient tolerated the surgery well. Taken to PACU where she was stabilized and then transferred to the orthopedic floor.  Started on Lovenox 30 mg q 12 hrs.  Bilateral SCDs applied bilaterally at 80 mm. Heels elevated on bed with rolled towels. No evidence of DVT. Negative Homan. Physical therapy started on day #1 for gait training and transfer. OT started day #1 for ADL and assisted devices.  Patient's foley was d/c on day #1. Patient's IV  was d/c on day #2.  On post op day #3 patient was stable and ready for discharge to ***.    She was given perioperative antibiotics:  Anti-infectives (From admission, onward)    Start     Dose/Rate Route Frequency Ordered Stop   07/04/22 1345  ceFAZolin (ANCEF) IVPB 2g/100 mL premix        2 g 200 mL/hr over  30 Minutes Intravenous Every 6 hours 07/04/22 1146 07/04/22 2056   07/04/22 0617  ceFAZolin (ANCEF) 2-4 GM/100ML-% IVPB       Note to Pharmacy: [facility], [facility]: cabinet override      07/04/22 0617 07/04/22 0753   07/04/22 0600  ceFAZolin (ANCEF) IVPB 2g/100 mL premix        2 g 200 mL/hr over 30 Minutes Intravenous On call to O.R. 07/04/22 0101 07/04/22 0746     .  She was given sequential compression devices, early ambulation, and Lovenox, teds for DVT prophylaxis.  She benefited maximally from the hospital stay and there were no complications.    Recent vital signs:  Vitals:   07/05/22 0332 07/05/22 0803  BP: 123/60 106/63  Pulse: 88 85  Resp: 16 16  Temp: 98.4 F (36.9 C) 98 F (36.7 C)  SpO2:  96%    Recent laboratory studies:  Lab Results  Component Value Date   HGB 11.3 (L) 07/05/2022   HGB 12.5 07/04/2022   HGB 14.3 06/20/2022   Lab Results  Component Value Date   WBC 7.7 07/05/2022   PLT 216 07/05/2022   No results found for: "INR" Lab Results  Component Value Date   NA 137 07/05/2022   K 5.1 07/05/2022   CL 107 07/05/2022   CO2 27 07/05/2022   BUN 14 07/05/2022   CREATININE 0.90 07/05/2022   GLUCOSE 113 (H) 07/05/2022    Discharge Medications:  Allergies as of 07/05/2022       Reactions   Lyrica [pregabalin]    Oxycodone-acetaminophen    REACTION: Itching        Medication List     STOP taking these medications    celecoxib 200 MG capsule Commonly known as: CELEBREX   traMADol-acetaminophen 37.5-325 MG tablet Commonly known as: ULTRACET       TAKE these medications    acidophilus Caps capsule Take 1 capsule by mouth daily.   calcium carbonate 500 MG chewable tablet Commonly known as: TUMS - dosed in mg elemental calcium Chew 2 tablets by mouth as needed for indigestion or heartburn.   cycloSPORINE 0.05 % ophthalmic emulsion Commonly known as: RESTASIS Place 1 drop into both eyes 2 (two) times daily.   docusate  sodium 100 MG capsule Commonly known as: COLACE Take 1 capsule (100 mg total) by mouth 2 (two) times daily.   enoxaparin 40 MG/0.4ML injection Commonly known as: LOVENOX Inject 0.4 mLs (40 mg total) into the skin daily for 14 days.   gabapentin 300 MG capsule Commonly known as: NEURONTIN Take 300 mg by mouth 3 (three) times daily. Take 3 -4 caps nightly as needed   GLUCOSAMINE CHOND CMP ADVANCED PO Take 1 capsule by mouth daily.   HYDROcodone-acetaminophen 5-325 MG tablet Commonly known as: NORCO/VICODIN Take 1-2 tablets by mouth every 4 (four) hours as needed for moderate pain (pain score 4-6). What changed:  how much to take reasons to take this   HYDROmorphone 2 MG tablet Commonly known as: DILAUDID Take 1 tablet (2 mg total) by mouth every 4 (four) hours as needed for severe pain.   levothyroxine 75 MCG tablet Commonly known as: SYNTHROID Take 88 mcg by mouth daily. Increased to 88 mcg   magnesium oxide 400 (240 Mg) MG tablet Commonly known as: MAG-OX Take 400 mg by mouth daily.   melatonin 5 MG Tabs Take 1 tablet by mouth at bedtime.   methocarbamol 500 MG tablet Commonly known as: ROBAXIN Take 1 tablet (500 mg total) by mouth every 6 (six) hours as needed for muscle spasms.   multivitamin capsule Take 1 capsule by mouth daily.   OVER THE COUNTER MEDICATION Take 2 capsules by mouth daily. Beet Root   Potassium 99 MG Tabs Take 1 tablet by mouth daily.   PREVAGEN PO Take 1 capsule by mouth daily.   Simethicone 250 MG Caps Take 1 capsule by mouth daily.   UNABLE TO FIND Take 2 tablets by mouth QID.  [place]ands Leg cramps   vitamin B-12 1000 MCG tablet Commonly known as: CYANOCOBALAMIN Take 1,000 mcg by mouth daily.               Durable Medical Equipment  (From admission, onward)           Start     Ordered   07/04/22 1147  DME Walker rolling  Once       Question Answer Comment  Walker: With 5 Inch Wheels   Patient needs a walker to  treat with the following condition S/P TKR (total knee replacement) using cement, left      07/04/22 1146   07/04/22 1147  DME 3 n 1  Once        07/04/22 1146   07/04/22 1147  DME Bedside commode  Once       Question:  Patient needs a bedside commode to treat with the following condition  Answer:  S/P TKR (total knee replacement)  using cement, left   07/04/22 1146            Diagnostic Studies: DG Knee 1-2 Views Left  Result Date: 07/04/2022 CLINICAL DATA:  Status post total knee replacement EXAM: LEFT KNEE - 1-2 VIEW COMPARISON:  None available FINDINGS: Left total knee prosthesis is well seated without periprosthetic fracture or lucency. Surgical drain and subcutaneous emphysema consistent with immediate postop status. IMPRESSION: Uncomplicated left total knee prosthesis with immediate postop changes. Electronically Signed   By: Miachel Roux M.D.   On: 07/04/2022 10:08    Disposition:      Follow-up Information     Duanne Guess, PA-C Follow up in 2 week(s).   Specialties: Orthopedic Surgery, Emergency Medicine Contact information: [place] [address] [phone]                  Signed: Feliberto Gottron 07/05/2022, 12:39 PM

## 2022-07-05 NOTE — Evaluation (Signed)
Occupational Therapy Evaluation Patient Details Name: Jodi [name] MRN: [id number] DOB: 12-18-1941 Today's Date: 07/05/2022   [name] Pt is a 81 yo F s/p L TKA. PMH includes GERD, anxiety, hypothryroidism, HLD, neuropathy   Clinical Impression   Jodi [name] was seen for OT evaluation this date. Prior to hospital admission, pt was MOD I for mobility and ADLs using 4WW. Pt lives with spouse and daughter in home c threshold step entrance. Pt presents to acute OT demonstrating impaired ADL performance and functional mobility 2/2 pain, decreased activity tolerance and functional strength/ROM/balance deficits. Upon arrival pt yelling out in pain, RN aware and arriving with pain medicine. Pt requesting return to bed after OOB to chair ~1 hour citing 10/10 L knee pain.   Pt unable to offload weight onto LLE to take steps towards R side with MAX A x2. Required MAX A x2 + RW for chair>bed picot with shuffling steps - pt remains flexed 90* at trunk, unable to straighten arms. MIN A self-drinking in sitting. TOTAL A for LB access in supine. Spouse arrives at end of session, pt left supine with all needs in reach. Pt would benefit from skilled OT to address noted impairments and functional limitations (see below for any additional details). Upon hospital discharge, recommend STR to maximize pt safety and return to PLOF.  SUPINE following t/f: BP 109/52, MAP 69, HR 71 - reported dizziness during t/f   Recommendations for follow up therapy are one component of a multi-disciplinary discharge planning process, led by the attending physician.  Recommendations may be updated based on patient status, additional functional criteria and insurance authorization.   Follow Up Recommendations  Skilled nursing-short term rehab (<3 hours/day)    Assistance Recommended at Discharge Frequent or constant Supervision/Assistance  Patient can return home with the following Two people to help with walking  and/or transfers;A lot of help with bathing/dressing/bathroom;Assistance with cooking/housework;Help with stairs or ramp for entrance    Functional Status Assessment  Patient has had a recent decline in their functional status and demonstrates the ability to make significant improvements in function in a reasonable and predictable amount of time.  Equipment Recommendations  BSC/3in1;Hospital bed    Recommendations for Other Services       Precautions / Restrictions Precautions Precautions: Knee;Fall Restrictions Weight Bearing Restrictions: Yes RLE Weight Bearing: Weight bearing as tolerated LLE Weight Bearing: Weight bearing as tolerated      Mobility Bed Mobility Overal bed mobility: Needs Assistance Bed Mobility: Sit to Supine       Sit to supine: Max assist, +2 for physical assistance        Transfers Overall transfer level: Needs assistance Equipment used: Rolling walker (2 wheels) Transfers: Sit to/from Stand, Bed to chair/wheelchair/BSC Sit to Stand: Max assist, +2 physical assistance Stand pivot transfers: Max assist, +2 physical assistance                Balance Overall balance assessment: Needs assistance Sitting-balance support: Feet supported, Single extremity supported Sitting balance-Leahy Scale: Fair     Standing balance support: Bilateral upper extremity supported, During functional activity, Reliant on assistive device for balance Standing balance-Leahy Scale: Poor                             ADL either performed or assessed with clinical judgement   ADL Overall ADL's : Needs assistance/impaired  General ADL Comments: MAX A x2 + RW for simulated BSC t/f - unable to take steps or stand upright, slides feet and remains flexed 90* at trunk. MIN A self-drinking in sitting. TOTAL A for LB access in supine      Pertinent Vitals/Pain Pain Assessment Pain Assessment: 0-10 Pain  Score: 10-Worst pain ever Pain Location: L knee Pain Descriptors / Indicators: Aching, Discomfort, Grimacing Pain Intervention(s): Limited activity within patient's tolerance, Repositioned, Patient requesting pain meds-RN notified, RN gave pain meds during session, Ice applied     Hand Dominance     Extremity/Trunk Assessment Upper Extremity Assessment Upper Extremity Assessment: Generalized weakness   Lower Extremity Assessment Lower Extremity Assessment: Generalized weakness       Communication Communication Communication: No difficulties   Cognition Arousal/Alertness: Awake/alert Behavior During Therapy: WFL for tasks assessed/performed Overall Cognitive Status: Within Functional Limits for tasks assessed                                       General Comments  SUPINE following t/f: BP 109/52, MAP 69, HR 71     Home Living Family/patient expects to be discharged to:: Private residence Living Arrangements: Spouse/significant other;Children Available Help at Discharge: Family;Available 24 hours/day Type of Home: House Home Access: Stairs to enter [employer] of Steps: 1/2 step threshold Entrance Stairs-Rails: None Home Layout: Two level Alternate Level Stairs-Number of Steps: has basement w/ chair lift   Bathroom Shower/Tub: Occupational psychologist: Handicapped height Bathroom Accessibility: Yes   Home Equipment: Rollator (4 wheels);Shower seat   Additional Comments: daughter works, spouse (uses [phone])  home 24/7      Prior Functioning/Environment Prior Level of Function : Independent/Modified Independent             Mobility Comments: Ind limited community ambulator using rollator, no hx of falls but reports having near falls ADLs Comments: Ind w/ ADLs; some assist with cooking        OT Problem List: Decreased strength;Decreased range of motion;Decreased activity tolerance;Impaired balance (sitting and/or  standing);Decreased safety awareness      OT Treatment/Interventions: Self-care/ADL training;Therapeutic exercise;Energy conservation;DME and/or AE instruction;Therapeutic activities;Patient/family education;Balance training    OT Goals(Current goals can be found in the care plan section) Acute Rehab OT Goals Patient Stated Goal: to improve pain and go home OT Goal Formulation: With patient/family Time For Goal Achievement: 07/19/22 Potential to Achieve Goals: Good ADL Goals Pt Will Perform Grooming: standing;with min assist Pt Will Perform Lower Body Dressing: with mod assist;sitting/lateral leans Pt Will Transfer to Toilet: with min assist;with +2 assist;ambulating;bedside commode  OT Frequency: Min 2X/week    Co-evaluation              AM-PAC OT "6 Clicks" Daily Activity     Outcome Measure Help from another person eating meals?: A Little Help from another person taking care of personal grooming?: A Little Help from another person toileting, which includes using toliet, bedpan, or urinal?: A Lot Help from another person bathing (including washing, rinsing, drying)?: A Lot Help from another person to put on and taking off regular upper body clothing?: A Little Help from another person to put on and taking off regular lower body clothing?: Total 6 Click Score: 14   End of Session Equipment Utilized During Treatment: Rolling walker (2 wheels);Gait belt;Oxygen Nurse Communication: Mobility status;Patient requests pain meds  Activity Tolerance: Patient limited by  pain Patient left: in bed;with call bell/phone within reach;with bed alarm set;with family/visitor present  OT Visit Diagnosis: Other abnormalities of gait and mobility (R26.89);Muscle weakness (generalized) (M62.81)                Time: 2263-3354 OT Time Calculation (min): 40 min Charges:  OT General Charges $OT Visit: 1 Visit OT Evaluation $OT Eval Moderate Complexity: 1 Mod OT Treatments $Self Care/Home  Management : 23-37 mins  Dessie Coma, M.S. OTR/L  07/05/22, 9:35 AM  ascom [phone]

## 2022-07-05 NOTE — Progress Notes (Cosign Needed)
Patient is not able to walk the distance required to go the bathroom, or he/she is unable to safely negotiate stairs required to access the bathroom.  A 3in1 BSC will alleviate this problem  

## 2022-07-06 LAB — CBC
HCT: 38.6 % (ref 36.0–46.0)
Hemoglobin: 12.4 g/dL (ref 12.0–15.0)
MCH: 30.5 pg (ref 26.0–34.0)
MCHC: 32.1 g/dL (ref 30.0–36.0)
MCV: 94.8 fL (ref 80.0–100.0)
Platelets: 191 10*3/uL (ref 150–400)
RBC: 4.07 MIL/uL (ref 3.87–5.11)
RDW: 12.7 % (ref 11.5–15.5)
WBC: 10.4 10*3/uL (ref 4.0–10.5)
nRBC: 0 % (ref 0.0–0.2)

## 2022-07-06 NOTE — Progress Notes (Signed)
Physical Therapy Treatment Patient Details Name: Jodi [name] MRN: [id number] DOB: April 01, 1941 Today's Date: 07/06/2022   [name] Pt is a 81 yo F s/p L TKA. PMH includes GERD, anxiety, hypothryroidism, HLD, neuropathy    PT Comments    First PT session, pt tolerated very little movement with LLE and declined mobility training yelling out in pain when assist/attempts to have pt participate in therapy.  Pt seemed very lethargic keeping eyes mostly closed.  This PTA provided education on the importance of PT and reviewed POC with pt and husband present.  Recommendations for follow up therapy are one component of a multi-disciplinary discharge planning process, led by the attending physician.  Recommendations may be updated based on patient status, additional functional criteria and insurance authorization.  Follow Up Recommendations  Skilled nursing-short term rehab (<3 hours/day) Can patient physically be transported by private vehicle: No   Assistance Recommended at Discharge Frequent or constant Supervision/Assistance  Patient can return home with the following A little help with bathing/dressing/bathroom;Assistance with cooking/housework;Assist for transportation;Help with stairs or ramp for entrance;A lot of help with walking and/or transfers   Equipment Recommendations  Rolling walker (2 wheels)    Recommendations for Other Services       Precautions / Restrictions Precautions Precautions: Knee;Fall Precaution Booklet Issued: Yes (comment) Restrictions Weight Bearing Restrictions: Yes RLE Weight Bearing: Weight bearing as tolerated LLE Weight Bearing: Weight bearing as tolerated     Mobility  Bed Mobility Overal bed mobility: Needs Assistance             General bed mobility comments: Pt unable to tolerate bed mobility requesting any movement of legs to stop due to pain.    Transfers                   General transfer comment: Pt  unable to tolerate bed mobility requesting any movement of legs to stop due    Ambulation/Gait         Gait velocity: Pt unable to tolerate bed mobility requesting any movement of legs to stop due         Stairs             Wheelchair Mobility    Modified Rankin (Stroke Patients Only)       Balance       Sitting balance - Comments: No seated activity during 1st PT session due to pt's request reporting severe pain.       Standing balance comment: No seated activity during 1st PT session due to pt's request reporting severe pain.                            Cognition Arousal/Alertness: Lethargic Behavior During Therapy: Agitated, Anxious Overall Cognitive Status: Impaired/Different from baseline                                 General Comments: Pt kept closing their eyes during session and not engaging in PT activity.        Exercises Total Joint Exercises Ankle Circles/Pumps: AAROM, Both, 5 reps (a few reps were performed but pt would not tolerate more.)    General Comments        Pertinent Vitals/Pain Pain Assessment Pain Assessment: Faces Faces Pain Scale: Hurts worst Pain Location: L knee. Pain Descriptors / Indicators: Aching, Discomfort, Grimacing    Home Living  Prior Function            PT Goals (current goals can now be found in the care plan section) Acute Rehab PT Goals Patient Stated Goal: Pt would like to get back to walking and increase strength/endurance PT Goal Formulation: With patient Time For Goal Achievement: 07/17/22 Potential to Achieve Goals: Good Progress towards PT goals: Progressing toward goals    Frequency    BID      PT Plan Current plan remains appropriate    Co-evaluation              AM-PAC PT "6 Clicks" Mobility   Outcome Measure  Help needed turning from your back to your side while in a flat bed without using bedrails?: A  Little Help needed moving from lying on your back to sitting on the side of a flat bed without using bedrails?: A Little Help needed moving to and from a bed to a chair (including a wheelchair)?: A Lot Help needed standing up from a chair using your arms (e.g., wheelchair or bedside chair)?: A Lot   Help needed climbing 3-5 steps with a railing? : Total 6 Click Score: 11    End of Session   Activity Tolerance: Patient limited by pain Patient left: in bed;with call bell/phone within reach;with bed alarm set;with family/visitor present Nurse Communication: Mobility status PT Visit Diagnosis: Other abnormalities of gait and mobility (R26.89);Muscle weakness (generalized) (M62.81);Pain Pain - Right/Left: Left Pain - part of body: Knee     Time: 1583-0940 PT Time Calculation (min) (ACUTE ONLY): 22 min  Charges:  $Therapeutic Activity: 8-22 mins                    Bjorn Loser, PTA  07/06/22, 5:45 PM

## 2022-07-06 NOTE — Progress Notes (Signed)
Physical Therapy Treatment Patient Details Name: Jodi [name] MRN: [id number] DOB: [birth year] Today's Date: 07/06/2022   [name] Pt is a 81 yo F s/p L TKA. PMH includes GERD, anxiety, hypothryroidism, HLD, neuropathy    PT Comments    Second PT, pt wanted to participate more but was still limited having difficulty keeping eyes opened, following commands, and reporting severe pain in L knee.  Working on bed mobility and upper trunk control in sitting and attempted sit<>stand tranfers, pt requiring Mod -Max A overall and extensive amount of time to perform.  +2 assist required for sit>supine, pt yelling out in pain.   Recommendations for follow up therapy are one component of a multi-disciplinary discharge planning process, led by the attending physician.  Recommendations may be updated based on patient status, additional functional criteria and insurance authorization.  Follow Up Recommendations  Skilled nursing-short term rehab (<3 hours/day) Can patient physically be transported by private vehicle: No   Assistance Recommended at Discharge Frequent or constant Supervision/Assistance  Patient can return home with the following A little help with bathing/dressing/bathroom;Assistance with cooking/housework;Assist for transportation;Help with stairs or ramp for entrance;A lot of help with walking and/or transfers   Equipment Recommendations  Rolling walker (2 wheels)    Recommendations for Other Services       Precautions / Restrictions Precautions Precautions: Knee;Fall Precaution Booklet Issued: Yes (comment) Restrictions Weight Bearing Restrictions: Yes RLE Weight Bearing: Weight bearing as tolerated LLE Weight Bearing: Weight bearing as tolerated     Mobility  Bed Mobility Overal bed mobility: Needs Assistance Bed Mobility: Sit to Supine     Supine to sit: Max assist, +2 for physical assistance Sit to supine: +2 for physical assistance   General bed  mobility comments: pt took an extensive amount of time for supine to sit, very anxious about pain.  +2 for sit to supine, pt yelling, reporting pain.    Transfers                   General transfer comment: Attempted sit<>stand EOB, pt unble to control trunk and move into exstension.    Ambulation/Gait         Gait velocity: Pt unable to tolerate bed mobility requesting any movement of legs to stop due     General Gait Details: Unable to  transition into standing.   Stairs             Wheelchair Mobility    Modified Rankin (Stroke Patients Only)       Balance Overall balance assessment: Needs assistance Sitting-balance support: Feet supported, Bilateral upper extremity supported Sitting balance-Leahy Scale: Poor Sitting balance - Comments: pm PT session, pt willing to sit EOB but required max to min A.       Standing balance comment: unable to stand during pm session.                            Cognition Arousal/Alertness: Lethargic Behavior During Therapy: Agitated, Anxious Overall Cognitive Status: Impaired/Different from baseline                                 General Comments: In the afternoon, pt engaged more but still had trouble following commands and keeping eyes open.        Exercises Total Joint Exercises Ankle Circles/Pumps: AAROM, Both, 5 reps (a few reps were  performed but pt would not tolerate more.) Other Exercises Other Exercises: Upper trunk and weight shifting activities for increased strength and balance.    General Comments        Pertinent Vitals/Pain Pain Assessment Pain Assessment: Faces Faces Pain Scale: Hurts worst Pain Location: L knee. Pain Descriptors / Indicators: Aching, Discomfort, Grimacing    Home Living                          Prior Function            PT Goals (current goals can now be found in the care plan section) Acute Rehab PT Goals Patient Stated Goal:  Pt would like to get back to walking and increase strength/endurance PT Goal Formulation: With patient Time For Goal Achievement: 07/17/22 Potential to Achieve Goals: Good Progress towards PT goals: Progressing toward goals    Frequency    BID      PT Plan Current plan remains appropriate    Co-evaluation              AM-PAC PT "6 Clicks" Mobility   Outcome Measure  Help needed turning from your back to your side while in a flat bed without using bedrails?: Total Help needed moving from lying on your back to sitting on the side of a flat bed without using bedrails?: Total Help needed moving to and from a bed to a chair (including a wheelchair)?: Total Help needed standing up from a chair using your arms (e.g., wheelchair or bedside chair)?: Total Help needed to walk in hospital room?: Total Help needed climbing 3-5 steps with a railing? : Total 6 Click Score: 5    End of Session   Activity Tolerance: Patient limited by pain Patient left: in bed;with call bell/phone within reach;with bed alarm set;with family/visitor present Nurse Communication: Mobility status PT Visit Diagnosis: Other abnormalities of gait and mobility (R26.89);Muscle weakness (generalized) (M62.81);Pain Pain - Right/Left: Left Pain - part of body: Knee     Time: 4562-5638 PT Time Calculation (min) (ACUTE ONLY): 46 min  Charges:  $Therapeutic Activity: 38-52 mins                     Bjorn Loser, PTA  07/06/22, 6:01 PM

## 2022-07-06 NOTE — Progress Notes (Signed)
  Subjective: 2 Days Post-Op Procedure(s) (LRB): TOTAL KNEE ARTHROPLASTY (Left) Husband at bedside.  Patient reports pain as severe.   Patient has no other complaints.  Plan is to go Skilled nursing facility after hospital stay. Negative for chest pain and shortness of breath   Objective: Vital signs in last 24 hours: Temp:  [97.8 F (36.6 C)-98.6 F (37 C)] 97.8 F (36.6 C) (07/15 0406) Pulse Rate:  [73-84] 83 (07/15 0406) Resp:  [13-20] 20 (07/15 0406) BP: (108-136)/(56-93) 120/56 (07/15 0406) SpO2:  [88 %-100 %] 92 % (07/15 0406)  Intake/Output from previous day:  Intake/Output Summary (Last 24 hours) at 07/06/2022 1050 Last data filed at 07/06/2022 1004 Gross per 24 hour  Intake 3000 ml  Output 20 ml  Net 2980 ml    Intake/Output this shift: Total I/O In: 2400 [P.O.:2400] Out: 20 [Urine:20]  Labs: Recent Labs    07/04/22 1227 07/05/22 0617 07/06/22 0532  HGB 12.5 11.3* 12.4   Recent Labs    07/05/22 0617 07/06/22 0532  WBC 7.7 10.4  RBC 3.78* 4.07  HCT 35.3* 38.6  PLT 216 191   Recent Labs    07/04/22 1227 07/05/22 0617  NA  --  137  K  --  5.1  CL  --  107  CO2  --  27  BUN  --  14  CREATININE 0.93 0.90  GLUCOSE  --  113*  CALCIUM  --  8.5*   No results for input(s): "LABPT", "INR" in the last 72 hours.   EXAM General - Patient is Alert, Appropriate, and Oriented Extremity - Neurovascular intact Compartment soft Able to flex and extend the toes  Dressing/Incision -Prevena in place, no drainage in cannister Motor Function - intact, moving foot and toes well on exam.     Assessment/Plan: 2 Days Post-Op Procedure(s) (LRB): TOTAL KNEE ARTHROPLASTY (Left) Principal Problem:   S/P TKR (total knee replacement) using cement, left  Estimated body mass index is 34.35 kg/m as calculated from the following:   Height as of this encounter: '5\' 2"'$  (1.575 m).   Weight as of this encounter: 85.2 kg. Advance diet Up with therapy  Likely d/c  to SNF based on patient slow progress and difficultly with pain control.     DVT Prophylaxis - Lovenox, Ted hose, and SCDs Weight-Bearing as tolerated to left leg  Cassell Smiles, PA-C [facility] 07/06/2022, 10:50 AM

## 2022-07-06 NOTE — Plan of Care (Signed)
  Problem: Pain Managment: Goal: General experience of comfort will improve Outcome: Progressing   Problem: Activity: Goal: Risk for activity intolerance will decrease Outcome: Not Progressing   Problem: Coping: Goal: Level of anxiety will decrease Outcome: Not Progressing

## 2022-07-06 NOTE — Plan of Care (Signed)

## 2022-07-07 NOTE — Progress Notes (Addendum)
Physical Therapy Treatment Patient Details Name: Jodi [name] MRN: [id number] DOB: May 07, 1941 Today's Date: 07/07/2022   [name] Pt is a 81 yo F s/p L TKA. PMH includes GERD, anxiety, hypothryroidism, HLD, neuropathy    PT Comments    Pt in bed, premedicated this am prior to session.  Pt c/o continued pain.  RN called to see if she could have more medication but concerns as it made her very lethargic yesterday and unable to do therapy.    Supine AAROM for very limited ROM in all ranges due to pain.  Attempted to get EOB with max a x 2 and she gets about 50% up but begins yelling to lay back down due to pain.  Unable to progress to EOB despite assist and time.  Repositioned for comfort.  Discussed with PA in regards to pain limiting progress in therapy.     Recommendations for follow up therapy are one component of a multi-disciplinary discharge planning process, led by the attending physician.  Recommendations may be updated based on patient status, additional functional criteria and insurance authorization.  Follow Up Recommendations  Skilled nursing-short term rehab (<3 hours/day)     Assistance Recommended at Discharge Frequent or constant Supervision/Assistance  Patient can return home with the following A little help with bathing/dressing/bathroom;Assistance with cooking/housework;Assist for transportation;Help with stairs or ramp for entrance;A lot of help with walking and/or transfers   Equipment Recommendations  Rolling walker (2 wheels)    Recommendations for Other Services       Precautions / Restrictions Precautions Precautions: Knee;Fall Precaution Booklet Issued: Yes (comment) Restrictions Weight Bearing Restrictions: Yes RLE Weight Bearing: Weight bearing as tolerated LLE Weight Bearing: Weight bearing as tolerated     Mobility  Bed Mobility Overal bed mobility: Needs Assistance Bed Mobility: Sit to Supine     Supine to sit: Max assist,  +2 for physical assistance     General bed mobility comments: unable to get fully to EOB - pt yelling to lay back down    Transfers   Equipment used: Rolling walker (2 wheels)                    Ambulation/Gait                   Stairs             Wheelchair Mobility    Modified Rankin (Stroke Patients Only)       Balance                                            Cognition Arousal/Alertness: Awake/alert Behavior During Therapy: Agitated, Anxious Overall Cognitive Status: Within Functional Limits for tasks assessed                                          Exercises Other Exercises Other Exercises: very limited range for AROM in supine x 10    General Comments        Pertinent Vitals/Pain Pain Assessment Pain Assessment: Faces Faces Pain Scale: Hurts worst Pain Location: L knee. Pain Descriptors / Indicators: Aching, Discomfort, Grimacing Pain Intervention(s): Premedicated before session, Repositioned, Ice applied, Limited activity within patient's tolerance, Monitored during session    Home Living  Prior Function            PT Goals (current goals can now be found in the care plan section) Progress towards PT goals: Not progressing toward goals - comment    Frequency    BID      PT Plan Current plan remains appropriate    Co-evaluation              AM-PAC PT "6 Clicks" Mobility   Outcome Measure  Help needed turning from your back to your side while in a flat bed without using bedrails?: Total Help needed moving from lying on your back to sitting on the side of a flat bed without using bedrails?: Total Help needed moving to and from a bed to a chair (including a wheelchair)?: Total Help needed standing up from a chair using your arms (e.g., wheelchair or bedside chair)?: Total Help needed to walk in hospital room?: Total Help needed climbing  3-5 steps with a railing? : Total 6 Click Score: 6    End of Session   Activity Tolerance: Patient limited by pain Patient left: in bed;with call bell/phone within reach;with bed alarm set;with family/visitor present Nurse Communication: Mobility status;Patient requests pain meds PT Visit Diagnosis: Other abnormalities of gait and mobility (R26.89);Muscle weakness (generalized) (M62.81);Pain Pain - Right/Left: Left     Time: 9518-8416 PT Time Calculation (min) (ACUTE ONLY): 25 min  Charges:  $Therapeutic Exercise: 23-37 mins                   Chesley Noon, PTA 07/07/22, 9:18 AM

## 2022-07-07 NOTE — Plan of Care (Signed)

## 2022-07-07 NOTE — Plan of Care (Signed)
  Problem: Activity: Goal: Risk for activity intolerance will decrease Outcome: Not Progressing   Problem: Coping: Goal: Level of anxiety will decrease Outcome: Not Progressing

## 2022-07-07 NOTE — Progress Notes (Signed)
  Subjective: 3 Days Post-Op Procedure(s) (LRB): TOTAL KNEE ARTHROPLASTY (Left) Husband at bedside.  Patient reports pain as severe.   Patient is well, and has had no acute complaints or problems Plan is to go Skilled nursing facility after hospital stay. Negative for chest pain and shortness of breath Fever: no Gastrointestinal: negative for nausea and vomiting.  Patient has not had a bowel movement.  Objective: Vital signs in last 24 hours: Temp:  [97.9 F (36.6 C)-99 F (37.2 C)] 98.2 F (36.8 C) (07/16 0824) Pulse Rate:  [84-91] 84 (07/16 0824) Resp:  [16-20] 16 (07/16 0824) BP: (122-138)/(58-93) 122/58 (07/16 0824) SpO2:  [95 %-100 %] 98 % (07/16 0824)  Intake/Output from previous day:  Intake/Output Summary (Last 24 hours) at 07/07/2022 1038 Last data filed at 07/07/2022 1018 Gross per 24 hour  Intake 840 ml  Output 1000 ml  Net -160 ml    Intake/Output this shift: Total I/O In: 240 [P.O.:240] Out: -   Labs: Recent Labs    07/04/22 1227 07/05/22 0617 07/06/22 0532  HGB 12.5 11.3* 12.4   Recent Labs    07/05/22 0617 07/06/22 0532  WBC 7.7 10.4  RBC 3.78* 4.07  HCT 35.3* 38.6  PLT 216 191   Recent Labs    07/04/22 1227 07/05/22 0617  NA  --  137  K  --  5.1  CL  --  107  CO2  --  27  BUN  --  14  CREATININE 0.93 0.90  GLUCOSE  --  113*  CALCIUM  --  8.5*   No results for input(s): "LABPT", "INR" in the last 72 hours.   EXAM General - Patient is Alert, Appropriate, and Oriented Extremity - Neurovascular intact Dorsiflexion/Plantar flexion intact Compartment soft; tender to palpation over the ankle, minimally tender over the thigh and upper calf; exquisitely tender over the RLE Dressing/Incision -Prevena in place, no drainage noted in cannister Motor Function - intact, moving foot and toes well on exam.  Gastrointestinal- soft and nontender   Assessment/Plan: 3 Days Post-Op Procedure(s) (LRB): TOTAL KNEE ARTHROPLASTY (Left) Principal  Problem:   S/P TKR (total knee replacement) using cement, left  Estimated body mass index is 34.35 kg/m as calculated from the following:   Height as of this encounter: '5\' 2"'$  (1.575 m).   Weight as of this encounter: 85.2 kg. Advance diet Up with therapy  Patient continues to be severely limited by pain, but is notably more alert and interactive today. Despite having no dilaudid or hydrocodone  today, pain does not seem increased from yesterday. Discussed that part of patient's increased perceived pain may be due in part to anxiety. Will discuss with attending use of anxiolytic when he returns Monday.  Heels floated.   DVT Prophylaxis - Lovenox, Ted hose, and SCDs Weight-Bearing as tolerated to left leg  Cassell Smiles, PA-C [facility] 07/07/2022, 10:38 AM

## 2022-07-07 NOTE — Plan of Care (Signed)
  Problem: Clinical Measurements: Goal: Will remain free from infection Outcome: Progressing   Problem: Activity: Goal: Risk for activity intolerance will decrease Outcome: Not Progressing   Problem: Nutrition: Goal: Adequate nutrition will be maintained Outcome: Progressing   Problem: Coping: Goal: Level of anxiety will decrease Outcome: Not Progressing   Problem: Elimination: Goal: Will not experience complications related to urinary retention Outcome: Progressing   Problem: Safety: Goal: Ability to remain free from injury will improve Outcome: Progressing

## 2022-07-07 NOTE — Progress Notes (Signed)
Patient seem less anxious and tearful today. Found patient doing bed exercises as pamphlet given by PT/OT.  Despite several attempts and education provided, patient is unwilling to use bone foam or keep heel floated.  Patient states "is uncomfortable," will continue to offer and reinforced education.

## 2022-07-08 DIAGNOSIS — Z96652 Presence of left artificial knee joint: Secondary | ICD-10-CM | POA: Insufficient documentation

## 2022-07-08 MED ORDER — COVID-19MRNA BIVAL VACC PFIZER 30 MCG/0.3ML IM SUSP
0.3000 mL | Freq: Once | INTRAMUSCULAR | Status: AC
  Administered 2022-07-08: 0.3 mL via INTRAMUSCULAR
  Filled 2022-07-08: qty 0.3

## 2022-07-08 MED ORDER — SENNOSIDES-DOCUSATE SODIUM 8.6-50 MG PO TABS: 1.0000 | ORAL_TABLET | Freq: Two times a day (BID) | ORAL | Status: DC

## 2022-07-08 NOTE — Progress Notes (Signed)
   Subjective: 4 Days Post-Op Procedure(s) (LRB): TOTAL KNEE ARTHROPLASTY (Left) Patient reports pain as mild Patient is well, and has had no acute complaints or problems Denies any CP, SOB, ABD pain. We will continue therapy today.    Objective: Vital signs in last 24 hours: Temp:  [97.6 F (36.4 C)-98.4 F (36.9 C)] 98.4 F (36.9 C) (07/17 0745) Pulse Rate:  [71-84] 71 (07/17 0745) Resp:  [15-20] 16 (07/17 0745) BP: (116-130)/(56-71) 130/60 (07/17 0745) SpO2:  [96 %-100 %] 100 % (07/17 0745)  Intake/Output from previous day: 07/16 0701 - 07/17 0700 In: 480 [P.O.:480] Out: 1000 [Urine:1000] Intake/Output this shift: No intake/output data recorded.  Recent Labs    07/06/22 0532  HGB 12.4   Recent Labs    07/06/22 0532  WBC 10.4  RBC 4.07  HCT 38.6  PLT 191   No results for input(s): "NA", "K", "CL", "CO2", "BUN", "CREATININE", "GLUCOSE", "CALCIUM" in the last 72 hours.  No results for input(s): "LABPT", "INR" in the last 72 hours.  EXAM General - Patient is Alert, Appropriate, and Oriented Extremity - Neurovascular intact Sensation intact distally Intact pulses distally Dorsiflexion/Plantar flexion intact Dressing - dressing C/D/I and no drainage, provena intact with out drainage Motor Function - intact, moving foot and toes well on exam.   Past Medical History:  Diagnosis Date   Anxiety    Hyperlipidemia    Hypothyroidism    Neuromuscular disorder (HCC)    neuropathy   PONV (postoperative nausea and vomiting)    Skin cancer     Assessment/Plan:   4 Days Post-Op Procedure(s) (LRB): TOTAL KNEE ARTHROPLASTY (Left) Principal Problem:   S/P TKR (total knee replacement) using cement, left  Estimated body mass index is 34.35 kg/m as calculated from the following:   Height as of this encounter: '5\' 2"'$  (1.575 m).   Weight as of this encounter: 85.2 kg. Advance diet Up with therapy Vital signs stable Pain controlled Care management to assist with  discharge   DVT Prophylaxis - Lovenox, TED hose, and SCDs Weight-Bearing as tolerated to left leg   T. Rachelle Hora, PA-C [place] 07/08/2022, 8:02 AM

## 2022-07-08 NOTE — Progress Notes (Signed)
EMS arrived to pick up patient, but patient had not had BM since 7/12.  [facility] refused patient until pt has BM, Dulcolax given.

## 2022-07-08 NOTE — Progress Notes (Signed)
Physical Therapy Treatment Patient Details Name: Jodi [name] MRN: [id number] DOB: 1941/11/21 Today's Date: 07/08/2022   [name] Pt is a 81 yo F s/p L TKA. PMH includes GERD, anxiety, hypothryroidism, HLD, neuropathy    PT Comments    Pt in bed.  Crying upon arrival.  Premedicated for session.  Encouragement given and expectations of recovery reviewed.   She is able to tolerate a bit more AAROM today and does seem more comfortable.  To EOB with mod a x 2 and once sitting she is able to maintain balance on her own.  She tolerates only minimal stretching and AAROM in sitting for knee flexion.  Stood with mod a x 2 and with time, she is able to take several very small hesitant steps to recliner at bedside.  Remained up after session with needs met.    Overall improved tolerance for session today.  She does remain very labile at times during session but does stop crying when she is given reassurance that everything is "normal".  SNF remains appropriate.   Recommendations for follow up therapy are one component of a multi-disciplinary discharge planning process, led by the attending physician.  Recommendations may be updated based on patient status, additional functional criteria and insurance authorization.  Follow Up Recommendations  Skilled nursing-short term rehab (<3 hours/day)     Assistance Recommended at Discharge Frequent or constant Supervision/Assistance  Patient can return home with the following A little help with bathing/dressing/bathroom;Assistance with cooking/housework;Assist for transportation;Help with stairs or ramp for entrance;A lot of help with walking and/or transfers   Equipment Recommendations  Rolling walker (2 wheels)    Recommendations for Other Services       Precautions / Restrictions Precautions Precautions: Knee;Fall Precaution Booklet Issued: Yes (comment) Restrictions Weight Bearing Restrictions: Yes RLE Weight Bearing: Weight  bearing as tolerated LLE Weight Bearing: Weight bearing as tolerated     Mobility  Bed Mobility Overal bed mobility: Needs Assistance Bed Mobility: Sit to Supine     Supine to sit: Min assist, Mod assist, +2 for physical assistance          Transfers Overall transfer level: Needs assistance Equipment used: Rolling walker (2 wheels) Transfers: Sit to/from Stand Sit to Stand: Mod assist, +2 physical assistance                Ambulation/Gait Ambulation/Gait assistance: Min assist, Mod assist, +2 physical assistance Gait Distance (Feet): 2 Feet Assistive device: Rolling walker (2 wheels) Gait Pattern/deviations: Step-to pattern, Decreased step length - right, Decreased step length - left       General Gait Details: small hesitant steps to chair at bedside   Stairs             Wheelchair Mobility    Modified Rankin (Stroke Patients Only)       Balance Overall balance assessment: Needs assistance Sitting-balance support: Feet supported, Bilateral upper extremity supported Sitting balance-Leahy Scale: Good     Standing balance support: Bilateral upper extremity supported, During functional activity, Reliant on assistive device for balance Standing balance-Leahy Scale: Poor                              Cognition Arousal/Alertness: Awake/alert Behavior During Therapy: Agitated, Anxious Overall Cognitive Status: Within Functional Limits for tasks assessed  Exercises Total Joint Exercises Ankle Circles/Pumps: Strengthening, 10 reps, Both Quad Sets: Strengthening, Both, 10 reps Gluteal Sets: Strengthening, Both, 10 reps Long Arc Quad: AAROM, Left, 10 reps Goniometric ROM: 4-55 - very pain limited.    General Comments        Pertinent Vitals/Pain Pain Assessment Pain Assessment: Faces Faces Pain Scale: Hurts worst Pain Location: L knee. Pain Descriptors / Indicators:  Aching, Discomfort, Grimacing Pain Intervention(s): Limited activity within patient's tolerance, Monitored during session, Premedicated before session, Repositioned, Ice applied    Home Living                          Prior Function            PT Goals (current goals can now be found in the care plan section) Progress towards PT goals: Progressing toward goals    Frequency    BID      PT Plan Current plan remains appropriate    Co-evaluation              AM-PAC PT "6 Clicks" Mobility   Outcome Measure  Help needed turning from your back to your side while in a flat bed without using bedrails?: A Lot Help needed moving from lying on your back to sitting on the side of a flat bed without using bedrails?: A Lot Help needed moving to and from a bed to a chair (including a wheelchair)?: A Lot Help needed standing up from a chair using your arms (e.g., wheelchair or bedside chair)?: A Lot Help needed to walk in hospital room?: A Lot Help needed climbing 3-5 steps with a railing? : Total 6 Click Score: 11    End of Session Equipment Utilized During Treatment: Gait belt Activity Tolerance: Patient limited by pain Patient left: in bed;with call bell/phone within reach;with bed alarm set;with family/visitor present Nurse Communication: Mobility status;Patient requests pain meds PT Visit Diagnosis: Other abnormalities of gait and mobility (R26.89);Muscle weakness (generalized) (M62.81);Pain Pain - Right/Left: Left     Time: 7116-5790 PT Time Calculation (min) (ACUTE ONLY): 24 min  Charges:  $Gait Training: 8-22 mins $Therapeutic Exercise: 8-22 mins                   Chesley Noon, PTA 07/08/22, 11:47 AM\

## 2022-07-08 NOTE — TOC Progression Note (Addendum)
Transition of Care [facility]) - Progression Note    Patient Details  Name: Jodi [name] MRN: [id number] Date of Birth: 01-04-1941  Transition of Care [facility]) CM/SW Contact  Eileen Stanford, LCSW Phone Number: 07/08/2022, 10:34 AM  Clinical Narrative:   CSW provided bed offers to daughter and she chooses [facility]. CSW reached out to [place] at [facility].  Garnette Scheuermann is requesting vaccine information on COVID.  Pt has only had two vaccines no booster. Pt agreed to get the booster in order to go to [facility].   Expected Discharge Plan: [place] Barriers to Discharge: Barriers Resolved  Expected Discharge Plan and Services Expected Discharge Plan: [place] arrangements for the past 2 months: Single Family Home                 DME Arranged: Walker rolling, 3-N-1 DME Agency: [employer] Date DME Agency Contacted: 07/05/22 Time DME Agency Contacted: [phone] Representative spoke with at DME Agency: [facility] Arranged: PT [place]: [place] ([place]) Date [place]: 07/05/22 Time [place]: [phone] Representative spoke with at [place]: [place] ([place]) Interventions    Readmission Risk Interventions     No data to display

## 2022-07-08 NOTE — Plan of Care (Signed)
  Problem: Education: Goal: Knowledge of General Education information will improve Description: Including pain rating scale, medication(s)/side effects and non-pharmacologic comfort measures Outcome: Progressing   Problem: Health Behavior/Discharge Planning: Goal: Ability to manage health-related needs will improve Outcome: Progressing   Problem: Activity: Goal: Risk for activity intolerance will decrease Outcome: Progressing   Problem: Elimination: Goal: Will not experience complications related to bowel motility Outcome: Progressing Goal: Will not experience complications related to urinary retention Outcome: Progressing   Problem: Pain Managment: Goal: General experience of comfort will improve Outcome: Progressing   Problem: Safety: Goal: Ability to remain free from injury will improve Outcome: Progressing   Problem: Skin Integrity: Goal: Risk for impaired skin integrity will decrease Outcome: Progressing   Problem: Clinical Measurements: Goal: Ability to maintain clinical measurements within normal limits will improve Outcome: Adequate for Discharge Goal: Will remain free from infection Outcome: Adequate for Discharge Goal: Diagnostic test results will improve Outcome: Adequate for Discharge Goal: Respiratory complications will improve Outcome: Adequate for Discharge Goal: Cardiovascular complication will be avoided Outcome: Adequate for Discharge   Problem: Nutrition: Goal: Adequate nutrition will be maintained Outcome: Adequate for Discharge   Problem: Coping: Goal: Level of anxiety will decrease Outcome: Adequate for Discharge

## 2022-07-08 NOTE — TOC Transition Note (Signed)
Transition of Care [facility]) - CM/SW Discharge Note   Patient Details  Name: JAMILETT FERRANTE MRN: [id number] Date of Birth: 1941/08/06  Transition of Care [facility]) CM/SW Contact:  Eileen Stanford, LCSW Phone Number: 07/08/2022, 2:10 PM   Clinical Narrative:   Clinical Social Worker facilitated patient discharge including contacting patient family and facility to confirm patient discharge plans.  Clinical information faxed to facility and family agreeable with plan.  CSW arranged ambulance transport via [facility] to [facility] room 118 .  RN to call [phone] for report prior to discharge.       Final next level of care: Skilled Nursing Facility Barriers to Discharge: No Barriers Identified   Patient Goals and CMS Choice        Discharge Placement              Patient chooses bed at:  [facility]) Patient to be transferred to facility by: [facility] Name of family member notified: spouse at bedside Patient and family notified of of transfer: 07/08/22  Discharge Plan and Services                DME Arranged: Gilford Rile rolling, 3-N-1 DME Agency: [employer] Date DME Agency Contacted: 07/05/22 Time DME Agency Contacted: [phone] Representative spoke with at DME Agency: [place]: PT [place]: [place] ([place]) Date [place]: 07/05/22 Time [place]: [phone] Representative spoke with at [place]: [place] ([place]) Interventions     Readmission Risk Interventions     No data to display

## 2022-07-08 NOTE — Progress Notes (Signed)
Physical Therapy Treatment Patient Details Name: Jodi [name] MRN: [id number] DOB: 1941/04/08 Today's Date: 07/08/2022   [name] Pt is a 81 yo F s/p L TKA. PMH includes GERD, anxiety, hypothryroidism, HLD, neuropathy    PT Comments    Pt OOB x 3 hours today.  Stood and transferred back to bed with mod a x 2 and frequent post LOB's managed by staff.  Very small hesitant steps with extra time given.  Returned to supine with mod/max a x 2.  Tolerance for mobility continues to improve but remains quite limited needing encouragement and reassurance each session.   Recommendations for follow up therapy are one component of a multi-disciplinary discharge planning process, led by the attending physician.  Recommendations may be updated based on patient status, additional functional criteria and insurance authorization.  Follow Up Recommendations  Skilled nursing-short term rehab (<3 hours/day)     Assistance Recommended at Discharge Frequent or constant Supervision/Assistance  Patient can return home with the following A little help with bathing/dressing/bathroom;Assistance with cooking/housework;Assist for transportation;Help with stairs or ramp for entrance;A lot of help with walking and/or transfers   Equipment Recommendations  Rolling walker (2 wheels)    Recommendations for Other Services       Precautions / Restrictions Precautions Precautions: Knee;Fall Precaution Booklet Issued: Yes (comment) Restrictions Weight Bearing Restrictions: Yes RLE Weight Bearing: Weight bearing as tolerated LLE Weight Bearing: Weight bearing as tolerated     Mobility  Bed Mobility Overal bed mobility: Needs Assistance Bed Mobility: Sit to Supine     Supine to sit: Min assist, Mod assist, +2 for physical assistance Sit to supine: Mod assist, Max assist, +2 for physical assistance        Transfers Overall transfer level: Needs assistance Equipment used: Rolling  walker (2 wheels) Transfers: Sit to/from Stand Sit to Stand: Mod assist, +2 physical assistance                Ambulation/Gait Ambulation/Gait assistance: Min assist, Mod assist, +2 physical assistance Gait Distance (Feet): 2 Feet Assistive device: Rolling walker (2 wheels) Gait Pattern/deviations: Step-to pattern, Decreased step length - right, Decreased step length - left       General Gait Details: small hesitant steps back to bed   Stairs             Wheelchair Mobility    Modified Rankin (Stroke Patients Only)       Balance Overall balance assessment: Needs assistance Sitting-balance support: Feet supported, Bilateral upper extremity supported Sitting balance-Leahy Scale: Good     Standing balance support: Bilateral upper extremity supported, During functional activity, Reliant on assistive device for balance Standing balance-Leahy Scale: Poor Standing balance comment: frequent LOB's with transfer managed by staff.                            Cognition Arousal/Alertness: Awake/alert Behavior During Therapy: WFL for tasks assessed/performed, Anxious Overall Cognitive Status: Within Functional Limits for tasks assessed                                          Exercises Total Joint Exercises Ankle Circles/Pumps: Strengthening, 10 reps, Both Quad Sets: Strengthening, Both, 10 reps Gluteal Sets: Strengthening, Both, 10 reps Long Arc Quad: AAROM, Left, 10 reps Goniometric ROM: 4-55 - very pain limited.    General Comments  Pertinent Vitals/Pain Pain Assessment Pain Assessment: Faces Faces Pain Scale: Hurts even more Pain Location: L knee. Pain Descriptors / Indicators: Aching, Discomfort, Grimacing Pain Intervention(s): Limited activity within patient's tolerance, Monitored during session, Repositioned, Ice applied    Home Living                          Prior Function            PT Goals  (current goals can now be found in the care plan section) Progress towards PT goals: Progressing toward goals    Frequency    BID      PT Plan Current plan remains appropriate    Co-evaluation              AM-PAC PT "6 Clicks" Mobility   Outcome Measure  Help needed turning from your back to your side while in a flat bed without using bedrails?: A Lot Help needed moving from lying on your back to sitting on the side of a flat bed without using bedrails?: A Lot Help needed moving to and from a bed to a chair (including a wheelchair)?: A Lot Help needed standing up from a chair using your arms (e.g., wheelchair or bedside chair)?: A Lot Help needed to walk in hospital room?: A Lot Help needed climbing 3-5 steps with a railing? : Total 6 Click Score: 11    End of Session Equipment Utilized During Treatment: Gait belt Activity Tolerance: Patient limited by pain Patient left: in bed;with call bell/phone within reach;with bed alarm set;with family/visitor present Nurse Communication: Mobility status;Patient requests pain meds PT Visit Diagnosis: Other abnormalities of gait and mobility (R26.89);Muscle weakness (generalized) (M62.81);Pain Pain - Right/Left: Left     Time: 0103-0117 PT Time Calculation (min) (ACUTE ONLY): 14 min  Charges:  $Gait Training: 8-22 mins $Therapeutic Exercise: 8-22 mins $Therapeutic Activity: 8-22 mins                    Chesley Noon, PTA 07/08/22, 2:22 PM

## 2022-07-08 NOTE — Care Management Important Message (Signed)
Important Message  Patient Details  Name: ALAINA DONATI MRN: [id number] Date of Birth: 1941-07-02   Medicare Important Message Given:  Yes     Dannette Barbara 07/08/2022, 1:39 PM

## 2022-07-09 DIAGNOSIS — M48062 Spinal stenosis, lumbar region with neurogenic claudication: Secondary | ICD-10-CM | POA: Insufficient documentation

## 2022-07-09 DIAGNOSIS — M6281 Muscle weakness (generalized): Secondary | ICD-10-CM | POA: Insufficient documentation

## 2022-07-09 MED ORDER — SENNOSIDES-DOCUSATE SODIUM 8.6-50 MG PO TABS
1.0000 | ORAL_TABLET | Freq: Once | ORAL | Status: AC
  Administered 2022-07-09: 1 via ORAL
  Filled 2022-07-09: qty 1

## 2022-07-09 MED ORDER — BISACODYL 10 MG RE SUPP
10.0000 mg | Freq: Once | RECTAL | Status: AC
  Administered 2022-07-09: 10 mg via RECTAL
  Filled 2022-07-09: qty 1

## 2022-07-09 MED ORDER — MAGNESIUM HYDROXIDE 400 MG/5ML PO SUSP
30.0000 mL | Freq: Once | ORAL | Status: AC
  Administered 2022-07-09: 30 mL via ORAL
  Filled 2022-07-09: qty 30

## 2022-07-09 NOTE — Progress Notes (Signed)
Soap suds enema given, no results yet.

## 2022-07-09 NOTE — Progress Notes (Signed)
Patient was able to have bowel movement today, charted in flowsheets.  Report called to [facility], awaiting EMS to pick up patient.

## 2022-07-09 NOTE — Plan of Care (Signed)

## 2022-07-09 NOTE — Progress Notes (Signed)
Physical Therapy Treatment Patient Details Name: Jodi [name] MRN: [id number] DOB: [birth year] Today's Date: 07/09/2022   [name] Pt is a 81 yo F s/p L TKA. PMH includes GERD, anxiety, hypothryroidism, HLD, neuropathy    PT Comments    Pt received in bed this am, agreeable to PT. Pt able to transfer to EOB with ModA , use of side rail, and HOB raised. Pt incontinent of small formed stool. Assisted to [facility] with MinA for safety and support for decreased weight acceptance through L LE. Pt was able to have another small bowel movement while on commode. Pt transferred to bedside recliner for lunch. Remains motivated and is an excellent candidate for short term skilled PT. Continue per POC.  Recommendations for follow up therapy are one component of a multi-disciplinary discharge planning process, led by the attending physician.  Recommendations may be updated based on patient status, additional functional criteria and insurance authorization.  Follow Up Recommendations  Skilled nursing-short term rehab (<3 hours/day) Can patient physically be transported by private vehicle: No   Assistance Recommended at Discharge Frequent or constant Supervision/Assistance  Patient can return home with the following A little help with bathing/dressing/bathroom;Assistance with cooking/housework;Assist for transportation;Help with stairs or ramp for entrance;A lot of help with walking and/or transfers   Equipment Recommendations  None recommended by PT (TBD at next venue)    Recommendations for Other Services       Precautions / Restrictions Precautions Precautions: Knee;Fall Precaution Booklet Issued: Yes (comment) Restrictions Weight Bearing Restrictions: Yes LLE Weight Bearing: Weight bearing as tolerated     Mobility  Bed Mobility Overal bed mobility: Needs Assistance Bed Mobility: Sit to Supine     Supine to sit: Mod assist, HOB elevated     General bed mobility  comments: improved tolerance for EOB position    Transfers Overall transfer level: Needs assistance Equipment used: Rolling walker (2 wheels) Transfers: Sit to/from Stand Sit to Stand: Mod assist, From elevated surface           General transfer comment:  (Slow moving)    Ambulation/Gait Ambulation/Gait assistance: Min assist Gait Distance (Feet): 2 Feet Assistive device: Rolling walker (2 wheels) Gait Pattern/deviations: Step-to pattern, Decreased step length - right, Decreased step length - left Gait velocity: Pt unable to tolerate further gait training due to pain     General Gait Details: small hesitant steps back to bed   Stairs             Wheelchair Mobility    Modified Rankin (Stroke Patients Only)       Balance                                            Cognition Arousal/Alertness: Awake/alert Behavior During Therapy: WFL for tasks assessed/performed, Anxious Overall Cognitive Status: Within Functional Limits for tasks assessed                                          Exercises Total Joint Exercises Ankle Circles/Pumps: Strengthening, 10 reps, Both Quad Sets: Strengthening, Both, 10 reps Long Arc Quad: AAROM, Left, 10 reps    General Comments General comments (skin integrity, edema, etc.): Pt able to have a small bowel movement this am during session  Pertinent Vitals/Pain Pain Assessment Pain Assessment: 0-10 Pain Score: 5  Pain Location: L knee. Pain Descriptors / Indicators: Aching, Discomfort, Grimacing Pain Intervention(s): Monitored during session, Ice applied    Home Living                          Prior Function            PT Goals (current goals can now be found in the care plan section) Acute Rehab PT Goals Patient Stated Goal: Pt would like to get back to walking and increase strength/endurance    Frequency    BID      PT Plan Current plan remains appropriate     Co-evaluation              AM-PAC PT "6 Clicks" Mobility   Outcome Measure  Help needed turning from your back to your side while in a flat bed without using bedrails?: A Lot Help needed moving from lying on your back to sitting on the side of a flat bed without using bedrails?: A Lot Help needed moving to and from a bed to a chair (including a wheelchair)?: A Lot Help needed standing up from a chair using your arms (e.g., wheelchair or bedside chair)?: A Lot Help needed to walk in hospital room?: A Lot Help needed climbing 3-5 steps with a railing? : Total 6 Click Score: 11    End of Session Equipment Utilized During Treatment: Gait belt Activity Tolerance: Patient limited by pain Patient left: in chair;with call bell/phone within reach;with chair alarm set Nurse Communication: Mobility status;Patient requests pain meds PT Visit Diagnosis: Other abnormalities of gait and mobility (R26.89);Muscle weakness (generalized) (M62.81);Pain Pain - Right/Left: Left Pain - part of body: Knee     Time: 1300-1345 PT Time Calculation (min) (ACUTE ONLY): 45 min  Charges:  $Therapeutic Exercise: 8-22 mins $Therapeutic Activity: 23-37 mins          Jodi [name], PTA    Jodi [name] 07/09/2022, 1:47 PM

## 2022-07-09 NOTE — TOC Transition Note (Signed)
Transition of Care [facility]) - CM/SW Discharge Note   Patient Details  Name: Jodi [name] MRN: [id number] Date of Birth: 01-12-1941  Transition of Care [facility]) CM/SW Contact:  Eileen Stanford, LCSW Phone Number: 07/09/2022, 1:57 PM   Clinical Narrative:   Clinical Social Worker facilitated patient discharge including contacting patient family and facility to confirm patient discharge plans.  Clinical information faxed to facility and family agreeable with plan.  CSW arranged [facility] to [facility] room [facility] .  RN to call [phone] for report prior to discharge.     Final next level of care: Skilled Nursing Facility Barriers to Discharge: No Barriers Identified   Patient Goals and CMS Choice        Discharge Placement              Patient [facility] bed at:  [facility] Patient to be transferred to facility by: [facility] Name of family member notified: daughter Patient and family notified of of transfer: 07/09/22  Discharge Plan and Services                DME Arranged: Gilford Rile rolling, 3-N-1 DME Agency: [employer] Date DME Agency Contacted: 07/05/22 Time DME Agency Contacted: [phone] Representative spoke with at DME Agency: [place]: PT [place]: [place] ([place]) Date [place]: 07/05/22 Time [place]: [phone] Representative spoke with at [place]: [place] ([place]) Interventions     Readmission Risk Interventions     No data to display

## 2022-07-09 NOTE — Progress Notes (Signed)
   Subjective: 5 Days Post-Op Procedure(s) (LRB): TOTAL KNEE ARTHROPLASTY (Left) Patient reports pain as mild Patient is well, and has had no acute complaints or problems Denies any CP, SOB, ABD pain. We will continue therapy today.    Objective: Vital signs in last 24 hours: Temp:  [97.7 F (36.5 C)-98.4 F (36.9 C)] 98 F (36.7 C) (07/18 0405) Pulse Rate:  [71-81] 81 (07/18 0405) Resp:  [16-18] 18 (07/18 0405) BP: (124-137)/(60-76) 124/70 (07/18 0405) SpO2:  [97 %-100 %] 97 % (07/18 0405)  Intake/Output from previous day: 07/17 0701 - 07/18 0700 In: 680 [P.O.:680] Out: 450 [Urine:450] Intake/Output this shift: No intake/output data recorded.  No results for input(s): "HGB" in the last 72 hours.  No results for input(s): "WBC", "RBC", "HCT", "PLT" in the last 72 hours.  No results for input(s): "NA", "K", "CL", "CO2", "BUN", "CREATININE", "GLUCOSE", "CALCIUM" in the last 72 hours.  No results for input(s): "LABPT", "INR" in the last 72 hours.  EXAM General - Patient is Alert, Appropriate, and Oriented Extremity - Neurovascular intact Sensation intact distally Intact pulses distally Dorsiflexion/Plantar flexion intact Dressing - dressing C/D/I and no drainage, provena intact with out drainage Motor Function - intact, moving foot and toes well on exam.   Past Medical History:  Diagnosis Date   Anxiety    Hyperlipidemia    Hypothyroidism    Neuromuscular disorder (HCC)    neuropathy   PONV (postoperative nausea and vomiting)    Skin cancer     Assessment/Plan:   5 Days Post-Op Procedure(s) (LRB): TOTAL KNEE ARTHROPLASTY (Left) Principal Problem:   S/P TKR (total knee replacement) using cement, left  Estimated body mass index is 34.35 kg/m as calculated from the following:   Height as of this encounter: '5\' 2"'$  (1.575 m).   Weight as of this encounter: 85.2 kg. Advance diet Up with therapy Vital signs stable Pain controlled Care management to assist  with discharge to snf pending BM   DVT Prophylaxis - Lovenox, TED hose, and SCDs Weight-Bearing as tolerated to left leg   T. Rachelle Hora, PA-C [place] 07/09/2022, 7:43 AM

## 2022-07-11 DIAGNOSIS — M1712 Unilateral primary osteoarthritis, left knee: Secondary | ICD-10-CM | POA: Diagnosis not present

## 2022-07-11 DIAGNOSIS — K219 Gastro-esophageal reflux disease without esophagitis: Secondary | ICD-10-CM | POA: Diagnosis not present

## 2022-07-11 DIAGNOSIS — E039 Hypothyroidism, unspecified: Secondary | ICD-10-CM | POA: Diagnosis not present

## 2022-07-11 DIAGNOSIS — M48061 Spinal stenosis, lumbar region without neurogenic claudication: Secondary | ICD-10-CM | POA: Diagnosis not present

## 2022-07-23 ENCOUNTER — Telehealth: Payer: Self-pay | Admitting: Family Medicine

## 2022-07-23 NOTE — Telephone Encounter (Signed)
Tiffany called from [facility] [phone] option [phone]  To let us know the referral has been received and services will start on 8.2.23

## 2022-07-24 NOTE — Telephone Encounter (Signed)
Looks like this is an Pharmacist, hospital as it states services will start 8-2.

## 2022-11-27 ENCOUNTER — Other Ambulatory Visit: Payer: Self-pay | Admitting: Orthopedic Surgery

## 2022-12-02 ENCOUNTER — Encounter: Payer: Self-pay | Admitting: Orthopedic Surgery

## 2022-12-03 ENCOUNTER — Ambulatory Visit: Payer: Medicare Other | Admitting: Anesthesiology

## 2022-12-03 ENCOUNTER — Other Ambulatory Visit: Payer: Self-pay

## 2022-12-03 ENCOUNTER — Ambulatory Visit
Admission: RE | Admit: 2022-12-03 | Discharge: 2022-12-03 | Disposition: A | Discharge status: Home / Self Care | Payer: Medicare Other | Attending: Orthopedic Surgery | Admitting: Orthopedic Surgery

## 2022-12-03 ENCOUNTER — Encounter
Admission: RE | Disposition: A | Discharge status: Home / Self Care | Payer: Self-pay | Source: Home / Self Care | Attending: Orthopedic Surgery

## 2022-12-03 ENCOUNTER — Encounter: Payer: Self-pay | Admitting: Orthopedic Surgery

## 2022-12-03 DIAGNOSIS — M654 Radial styloid tenosynovitis [de Quervain]: Secondary | ICD-10-CM | POA: Insufficient documentation

## 2022-12-03 DIAGNOSIS — Z6834 Body mass index (BMI) 34.0-34.9, adult: Secondary | ICD-10-CM | POA: Diagnosis not present

## 2022-12-03 DIAGNOSIS — M67431 Ganglion, right wrist: Secondary | ICD-10-CM | POA: Insufficient documentation

## 2022-12-03 DIAGNOSIS — M1811 Unilateral primary osteoarthritis of first carpometacarpal joint, right hand: Secondary | ICD-10-CM | POA: Diagnosis not present

## 2022-12-03 DIAGNOSIS — E669 Obesity, unspecified: Secondary | ICD-10-CM | POA: Diagnosis not present

## 2022-12-03 DIAGNOSIS — Z79899 Other long term (current) drug therapy: Secondary | ICD-10-CM | POA: Diagnosis not present

## 2022-12-03 DIAGNOSIS — E039 Hypothyroidism, unspecified: Secondary | ICD-10-CM | POA: Insufficient documentation

## 2022-12-03 DIAGNOSIS — E785 Hyperlipidemia, unspecified: Secondary | ICD-10-CM | POA: Diagnosis not present

## 2022-12-03 HISTORY — PX: DORSAL COMPARTMENT RELEASE: SHX5039

## 2022-12-03 HISTORY — DX: Unspecified osteoarthritis, unspecified site: M19.90

## 2022-12-03 HISTORY — DX: Gastro-esophageal reflux disease without esophagitis: K21.9

## 2022-12-03 SURGERY — RELEASE, FIRST DORSAL COMPARTMENT, HAND
Anesthesia: General | Site: Hand | Laterality: Right

## 2022-12-03 MED ORDER — DEXAMETHASONE SODIUM PHOSPHATE 4 MG/ML IJ SOLN
INTRAMUSCULAR | Status: DC | PRN
  Administered 2022-12-03: 4 mg via INTRAVENOUS

## 2022-12-03 MED ORDER — CEFAZOLIN SODIUM-DEXTROSE 2-4 GM/100ML-% IV SOLN
2.0000 g | INTRAVENOUS | Status: AC
  Administered 2022-12-03: 2 g via INTRAVENOUS

## 2022-12-03 MED ORDER — LACTATED RINGERS IV SOLN: INTRAVENOUS | Status: DC

## 2022-12-03 MED ORDER — ACETAMINOPHEN 325 MG PO TABS: 325.0000 mg | ORAL_TABLET | Freq: Four times a day (QID) | ORAL | Status: DC | PRN

## 2022-12-03 MED ORDER — FENTANYL CITRATE PF 50 MCG/ML IJ SOSY: 25.0000 ug | PREFILLED_SYRINGE | INTRAMUSCULAR | Status: DC | PRN

## 2022-12-03 MED ORDER — ACETAMINOPHEN 10 MG/ML IV SOLN: 1000.0000 mg | Freq: Once | INTRAVENOUS | Status: DC | PRN

## 2022-12-03 MED ORDER — MORPHINE SULFATE (PF) 2 MG/ML IV SOLN: 0.5000 mg | INTRAVENOUS | Status: DC | PRN

## 2022-12-03 MED ORDER — HYDROCODONE-ACETAMINOPHEN 7.5-325 MG PO TABS: 1.0000 | ORAL_TABLET | ORAL | Status: DC | PRN

## 2022-12-03 MED ORDER — ONDANSETRON HCL 4 MG/2ML IJ SOLN: 4.0000 mg | Freq: Once | INTRAMUSCULAR | Status: DC | PRN

## 2022-12-03 MED ORDER — METOCLOPRAMIDE HCL 5 MG/ML IJ SOLN: 5.0000 mg | Freq: Three times a day (TID) | INTRAMUSCULAR | Status: DC | PRN

## 2022-12-03 MED ORDER — SUCCINYLCHOLINE CHLORIDE 200 MG/10ML IV SOSY
PREFILLED_SYRINGE | INTRAVENOUS | Status: DC | PRN
  Administered 2022-12-03: 100 mg via INTRAVENOUS

## 2022-12-03 MED ORDER — PROPOFOL 10 MG/ML IV BOLUS
INTRAVENOUS | Status: DC | PRN
  Administered 2022-12-03: 80 mg via INTRAVENOUS
  Administered 2022-12-03: 40 mg via INTRAVENOUS

## 2022-12-03 MED ORDER — BUPIVACAINE HCL 0.5 % IJ SOLN
INTRAMUSCULAR | Status: DC | PRN
  Administered 2022-12-03: 10 mL

## 2022-12-03 MED ORDER — HYDROCODONE-ACETAMINOPHEN 5-325 MG PO TABS: 1.0000 | ORAL_TABLET | ORAL | Status: DC | PRN

## 2022-12-03 MED ORDER — LIDOCAINE HCL (CARDIAC) PF 100 MG/5ML IV SOSY
PREFILLED_SYRINGE | INTRAVENOUS | Status: DC | PRN
  Administered 2022-12-03: 100 mg via INTRATRACHEAL

## 2022-12-03 MED ORDER — 0.9 % SODIUM CHLORIDE (POUR BTL) OPTIME
TOPICAL | Status: DC | PRN
  Administered 2022-12-03: 60 mL

## 2022-12-03 MED ORDER — ONDANSETRON HCL 4 MG/2ML IJ SOLN
INTRAMUSCULAR | Status: DC | PRN
  Administered 2022-12-03: 4 mg via INTRAVENOUS

## 2022-12-03 MED ORDER — METOCLOPRAMIDE HCL 5 MG PO TABS: 5.0000 mg | ORAL_TABLET | Freq: Three times a day (TID) | ORAL | Status: DC | PRN

## 2022-12-03 MED ORDER — ONDANSETRON HCL 4 MG PO TABS: 4.0000 mg | ORAL_TABLET | Freq: Four times a day (QID) | ORAL | Status: DC | PRN

## 2022-12-03 MED ORDER — ONDANSETRON HCL 4 MG/2ML IJ SOLN: 4.0000 mg | Freq: Four times a day (QID) | INTRAMUSCULAR | Status: DC | PRN

## 2022-12-03 MED ORDER — FENTANYL CITRATE (PF) 100 MCG/2ML IJ SOLN
INTRAMUSCULAR | Status: DC | PRN
  Administered 2022-12-03 (×2): 50 ug via INTRAVENOUS

## 2022-12-03 MED ORDER — SODIUM CHLORIDE 0.9 % IV SOLN: INTRAVENOUS | Status: DC

## 2022-12-03 SURGICAL SUPPLY — 21 items
APL PRP STRL LF DISP 70% ISPRP (MISCELLANEOUS) ×1
BNDG CMPR STD VLCR NS LF 5.8X4 (GAUZE/BANDAGES/DRESSINGS) ×1
BNDG ELASTIC 4X5.8 VLCR NS LF (GAUZE/BANDAGES/DRESSINGS) IMPLANT
CHLORAPREP W/TINT 26 (MISCELLANEOUS) ×1 IMPLANT
COVER LIGHT HANDLE UNIVERSAL (MISCELLANEOUS) ×2 IMPLANT
GAUZE SPONGE 4X4 12PLY STRL (GAUZE/BANDAGES/DRESSINGS) ×1 IMPLANT
GAUZE XEROFORM 1X8 LF (GAUZE/BANDAGES/DRESSINGS) ×1 IMPLANT
GLOVE SURG SYN 9.0  PF PI (GLOVE) ×2
GLOVE SURG SYN 9.0 PF PI (GLOVE) ×1 IMPLANT
GOWN SRG XL 47XLVL 3 (GOWN DISPOSABLE) IMPLANT
GOWN STRL NON-REIN TWL XL LVL3 (GOWN DISPOSABLE) ×1
GOWN STRL REUS W/ TWL LRG LVL3 (GOWN DISPOSABLE) ×1 IMPLANT
GOWN STRL REUS W/TWL LRG LVL3 (GOWN DISPOSABLE) ×1
KIT TURNOVER KIT A (KITS) ×1 IMPLANT
NS IRRIG 500ML POUR BTL (IV SOLUTION) ×1 IMPLANT
PACK EXTREMITY ARMC (MISCELLANEOUS) ×1 IMPLANT
PAD CAST 4YDX4 CTTN HI CHSV (CAST SUPPLIES) ×1 IMPLANT
PADDING CAST COTTON 4X4 STRL (CAST SUPPLIES) ×1
SUT ETHILON 4-0 (SUTURE) ×1
SUT ETHILON 4-0 FS2 18XMFL BLK (SUTURE) ×1
SUTURE ETHLN 4-0 FS2 18XMF BLK (SUTURE) ×1 IMPLANT

## 2022-12-03 NOTE — Anesthesia Postprocedure Evaluation (Signed)
Anesthesia Post Note  Patient: Jodi [name]  Procedure(s) Performed: RELEASE DORSAL COMPARTMENT (DEQUERVAIN) (Right: Hand)  Patient location during evaluation: PACU Anesthesia Type: General Level of consciousness: awake and alert Pain management: pain level controlled Vital Signs Assessment: post-procedure vital signs reviewed and stable Respiratory status: spontaneous breathing, nonlabored ventilation, respiratory function stable and patient connected to nasal cannula oxygen Cardiovascular status: blood pressure returned to baseline and stable Postop Assessment: no apparent nausea or vomiting Anesthetic complications: no   No notable events documented.   Last Vitals:  Vitals:   12/03/22 1305 12/03/22 1306  BP:    Pulse: 64 63  Resp: 15 15  Temp:    SpO2: 95% 93%    Last Pain:  Vitals:   12/03/22 1305  TempSrc:   PainSc: 0-No pain                 Arita Miss

## 2022-12-03 NOTE — Transfer of Care (Signed)
Immediate Anesthesia Transfer of Care Note  Patient: Jodi [name]  Procedure(s) Performed: RELEASE DORSAL COMPARTMENT (DEQUERVAIN) (Right: Hand)  Patient Location: PACU  Anesthesia Type: No value filed.  Level of Consciousness: awake, alert  and patient cooperative  Airway and Oxygen Therapy: Patient Spontanous Breathing and Patient connected to supplemental oxygen  Post-op Assessment: Post-op Vital signs reviewed, Patient's Cardiovascular Status Stable, Respiratory Function Stable, Patent Airway and No signs of Nausea or vomiting  Post-op Vital Signs: Reviewed and stable  Complications: No notable events documented.

## 2022-12-03 NOTE — Discharge Instructions (Signed)
Keep dressing clean and dry. Okay to loosen Ace wrap feels too tight leave cotton wrap alone Tylenol or ibuprofen for pain Call office if you are having problems Okay to use the hand is much as you can

## 2022-12-03 NOTE — Anesthesia Preprocedure Evaluation (Signed)
Anesthesia Evaluation  Patient identified by MRN, date of birth, ID band Patient awake    Reviewed: Allergy & Precautions, NPO status , Patient's Chart, lab work & pertinent test results  History of Anesthesia Complications (+) PONV and history of anesthetic complications  Airway Mallampati: III  TM Distance: >3 FB Neck ROM: Full    Dental no notable dental hx. (+) Teeth Intact   Pulmonary neg pulmonary ROS, neg sleep apnea, neg COPD, Patient abstained from smoking.Not current smoker   Pulmonary exam normal breath sounds clear to auscultation       Cardiovascular Exercise Tolerance: Good METS(-) hypertension(-) CAD and (-) Past MI negative cardio ROS (-) dysrhythmias  Rhythm:Regular Rate:Normal - Systolic murmurs    Neuro/Psych  PSYCHIATRIC DISORDERS Anxiety     negative neurological ROS     GI/Hepatic ,GERD  ,,(+)     (-) substance abuse    Endo/Other  neg diabetesHypothyroidism    Renal/GU negative Renal ROS     Musculoskeletal  (+) Arthritis ,    Abdominal  (+) + obese  Peds  Hematology   Anesthesia Other Findings Past Medical History: No date: Anxiety No date: Arthritis No date: GERD (gastroesophageal reflux disease) No date: Hyperlipidemia No date: Hypothyroidism No date: Neuromuscular disorder (HCC)     Comment:  neuropathy No date: PONV (postoperative nausea and vomiting) No date: Skin cancer  Reproductive/Obstetrics                             Anesthesia Physical Anesthesia Plan  ASA: 2  Anesthesia Plan: General   Post-op Pain Management: Ofirmev IV (intra-op)   Induction: Intravenous  PONV Risk Score and Plan: 4 or greater and Ondansetron, Dexamethasone and Treatment may vary due to age or medical condition  Airway Management Planned: LMA  Additional Equipment: None  Intra-op Plan:   Post-operative Plan: Extubation in OR  Informed Consent: I have reviewed  the patients History and Physical, chart, labs and discussed the procedure including the risks, benefits and alternatives for the proposed anesthesia with the patient or authorized representative who has indicated his/her understanding and acceptance.     Dental advisory given  Plan Discussed with: CRNA and Surgeon  Anesthesia Plan Comments: (Discussed risks of anesthesia with patient, including PONV, sore throat, lip/dental/eye damage. Rare risks discussed as well, such as cardiorespiratory and neurological sequelae, and allergic reactions. Discussed the role of CRNA in patient's perioperative care. Patient understands.)       Anesthesia Quick Evaluation

## 2022-12-03 NOTE — Anesthesia Procedure Notes (Signed)
Procedure Name: Intubation Date/Time: 12/03/2022 12:20 PM  Performed by: Patience Musca., CRNAPre-anesthesia Checklist: Patient identified, Patient being monitored, Timeout performed, Emergency Drugs available and Suction available Patient Re-evaluated:Patient Re-evaluated prior to induction Oxygen Delivery Method: Circle system utilized Preoxygenation: Pre-oxygenation with 100% oxygen Induction Type: IV induction Ventilation: Mask ventilation without difficulty Laryngoscope Size: Mac, 3 and McGraph Grade View: Grade I Tube type: Oral Tube size: 6.5 mm Number of attempts: 1 Airway Equipment and Method: Stylet Placement Confirmation: ETT inserted through vocal cords under direct vision, positive ETCO2 and breath sounds checked- equal and bilateral Secured at: 21 cm Tube secured with: Tape Dental Injury: Teeth and Oropharynx as per pre-operative assessment  Comments: Previous attempt to effectively ventilate with LMA 3 not successful. Exchanged to LMA 4 not successful TVs. Decided to intubate. Very small mouth opening. Anterior airway

## 2022-12-03 NOTE — H&P (Signed)
Chief Complaint Patient presents with Right Wrist - Pain  [name]:  Jodi [name] is a 81 y.o. female that presents to clinic today for follow up evaluation and management of right pain and swelling previously treated as de Quervain's tenosynovitis. They were last evaluated by myself on 06/18/2022. At that time, the plan was to perform a steroid injection. She reports that this did not provide long-lasting improvement in her symptoms. She follows up today for reevaluation of her ongoing wrist pains.  There is no new data to be reviewed. Her last labs from 02/22/2022 show creatinine 1 with EGFR 53, normal electrolytes, normal liver function, albumin 4.2, normal vitamin B12, normal CBC, normal ESR, normal TSH, normal CRP.  Today, the patient reports their symptoms are still moderately exacerbated on her right radial sided wrist. She currently rates pain severity as a 6/10. She reports associated swelling over her area of pain, pain at night. She denies associated bruising, skin color change, finger locking/catching, instability, numbness and tingling, weakness, fever/chills, night sweats, weight loss. She has also previously tried [employer], Hemp cream, ice, thumb spica wrist brace, Tylenol, celecoxib, gabapentin, hydrocodone.  She is right hand dominant and is retired.  She reports that her pain is limiting her ability to grip and use her right hand.  Medications, Past Medical/Surgical/Family/Social History:  Current Outpatient Medications Ordered in Epic Medication Sig Dispense Refill acetaminophen (TYLENOL) 650 MG ER tablet Take 650 mg by mouth every 8 (eight) hours as needed for Pain. apoaequorin (PREVAGEN) capsule Take by mouth calcium carbonate (TUMS) 200 mg calcium (500 mg) chewable tablet Take 2 tablets by mouth as needed cyanocobalamin (VITAMIN B12) 1000 MCG tablet Take 1,000 mcg by mouth once daily docusate (COLACE) 100 MG capsule Take 100 mg by mouth 2 (two) times  daily gabapentin (NEURONTIN) 300 MG capsule TAKE 4 CAPSULES BY MOUTH NIGHTLY 120 capsule 2 GLUCOSAM & CHONDROIT-MV & MIN3 (GLUCOSAMINE &CHONDROIT-MV-MIN3 ORAL) Take by mouth. HYDROcodone-acetaminophen (NORCO) 5-325 mg tablet Take 1-2 tablets by mouth every 6 (six) hours as needed 50 tablet 0 L. acidophilus/Bifid. animalis 32 billion cell Cap Take 1 capsule by mouth once daily levothyroxine (SYNTHROID) 88 MCG tablet TAKE 1 TABLET EVERY MORNING WITH A GLASSOF WATER 30 TO 60 MINUTES BEFORE BREAKFAST 180 tablet 1 magnesium oxide,aspartate,citr 400 mg magnesium Cap Take by mouth melatonin 5 mg Tab Take by mouth. methocarbamoL (ROBAXIN) 500 MG tablet Take 500 mg by mouth 4 (four) times daily MULTIVITAMIN ORAL Take 1 tablet by mouth once daily. potassium 99 mg Tab Take by mouth potassium gluconate 2.5 mEq Tab Take 1 tablet by mouth once daily RESTASIS 0.05 % ophthalmic emulsion simethicone 250 mg Cap Take 250 mg by mouth once daily  No current Epic-ordered facility-administered medications on file.  SECONDARY CONDITIONS THAT INFLUENCE TREATMENT AND DECISION-MAKING: Former smoker  Past Medical History: Obesity, hypothyroid, peripheral neuropathy, hyperlipidemia, chronic pain on narcotics, chronic kidney disease  Past Surgical History: Cervical spine ACDF x2, lumbar spine surgery, trigger finger release  Relevant Orthopedic Family History: None  Physical Examination:  BP 132/68  Ht 157.5 cm (_0 )  Wt 86.2 kg (190 lb)  BMI 34.75 kg/m General/Constitutional: Well-nourished, well developed, no apparent distress. Psych: Normal mood and affect. Conversant. Judgement intact. Musculoskeletal: Comprehensive Wrist Exam: Inspection Right Left Alignment Neutral Neutral Skin No rashes, lesions, or wounds. No ecchymosis or erythema. No rashes, lesions, or wounds. No ecchymosis or erythema. Soft Tissue + palpable ganglion over radial styloid in the 1st dorsal compartment.  No obvious deformity.  No focal soft tissue swelling. No obvious deformity.  Palpation Right Left Tenderness + radial styloid.  No snuff box tenderness. No ulnar soft spot pain. No tendon, muscle, bony pain. No snuff box tenderness. No ulnar soft spot pain. Crepitus None None Effusion No joint effusion No joint effusion  Range of Motion Right Left Pronation Full to 90 degrees Full to 90 degrees Supination Full to 90 degrees Full to 90 degrees Wrist Flexion Limited to 50 degrees with pain Full to 70 degrees Wrist Extension Limited to 50 degrees with pain Full to 70 degrees  Strength Right Left Pronation 5/5 5/5 Supination 5/5 5/5 Wrist Flexion 5/5 5/5 Wrist Extension 5/5 5/5  Special Tests Right Left Finkelstein's Positive Negative  Neurovascular Right Left Distal Motor Normal Normal Distal Sensory Normal light touch sensation Normal light touch sensation Distal Pulses Normal Normal  Tests Performed/Ordered:  None  Tests Previously Reviewed: EXAM: Right Wrist Radiographs - 3 views (PA, Lateral, Oblique) performed 06/18/2022  CLINICAL INFORMATION: Acute right wrist pain  COMPARISON: None  FINDINGS: Normal wrist alignment. No scapholunate dissociation. No fractures or dislocations. No soft tissue swelling or joint effusion. Mild radiocarpal degenerative change with subchondral sclerosis, joint space narrowing. There also appears to be some chondrocalcinosis along the TFCC. Moderate first [place] joint degenerative change with joint space narrowing, osteophyte formation. No loose bodies. No abnormal bone lesions.  I personally reviewed and visualized the imaging studies if available.  Assessment:  ICD-10-CM 1. Right wrist pain M25.531 2. 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 Ganglion of tendon M67.40  Plan:  I have discussed the nature of her current subjective complaints, clinical examination, test results and have reviewed treatment options. The plan is to do the following;  -  The patient has chronic right radial sided wrist pain suspected to be due to de Quervain's tenosynovitis. She also has underlying first [place] arthritis change but she is most significantly tender to palpation over her first dorsal compartment. - She has some pain at her first dorsal compartment with wrist range of motion. She has significant tenderness to palpation over her first dorsal compartment with small palpable ganglion cyst. She has severe pain with Finkelstein's maneuver. Previous x-ray of the wrist shows some mild radiocarpal arthritis change and moderate first [place] joint arthritis change. I discussed the nonsurgical versus surgical treatment of de Quervain's tenosynovitis. She has tried treatment with thumb spica wrist brace, topical pain creams, over-the-counter acetaminophen, various classes of prescription medications such as celecoxib/gabapentin/hydrocodone, and ultrasound-guided steroid injection. Because she has ongoing symptoms, she would like to have surgery. I discussed the risks (bleeding, infection, nerve and/or blood vessel injury, persistent and/or recurrent pain, stiffness, dislocation, failure of the repair, recurrence of the tear, progression of arthritis, need for further surgery, blood clots, strokes, heart attacks and/or arrhythmias, pneumonia, etc.), benefits, and alternatives of the procedure. The patient states understanding and agrees to have surgery. I discussed this with Dr. Rudene Christians since the patient is anxious and may either want an antianxiety medicine or will need to have surgery in [place]. - Activity as tolerated. Modify activity as needed according to symptoms. No limitations to weight bearing. She can use her thumb spica wrist splint to rest the area. - Use Tylenol, anti-inflammatories, topical diclofenac/pain cream, relative rest, compression, massage, and ice/heat as needed for pain. - Follow up for right first dorsal compartment retinaculum release and ganglion cyst  excision with Dr. Rudene Christians.  I spent a total of 34 minutes in both face-to-face  and non face-to-face activities, excluding procedures performed, for this visit on the date of this encounter which included: Patient Encounter, Counseling/Educating on underlying condition plus treatment options, Documentation, Preparing orders and/or referrals, and Care coordination and communication with Dr. Rudene Christians regarding surgery.  Contact our office with any questions or concerns. Follow up as indicated, or sooner should any new problems arise, if conditions worsen, or if they are otherwise concerned.  Rosalia Hammers, DO [facility] [address], [place] [address] Phone: [phone]  This note was generated in part with voice recognition software and I apologize for any typographical errors that were not detected and corrected.  Electronically signed by Rosalia Hammers, DO at 11/25/2022 9:39 AM EST   Reviewed  H+P. No changes noted.

## 2022-12-03 NOTE — Op Note (Signed)
12/03/2022  12:43 PM  PATIENT:  Jodi [name]  81 y.o. 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  POST-OPERATIVE DIAGNOSIS:  Right wrist pain M25.531De Quervain's tenosynovitis, right M65.4  PROCEDURE:  Procedure(s): RELEASE DORSAL COMPARTMENT (DEQUERVAIN) (Right)  SURGEON: Laurene Footman, MD  ASSISTANTS: None  ANESTHESIA:   general  EBL:  Total I/O In: 100 [IV Piggyback:100] Out: -   BLOOD ADMINISTERED:none  DRAINS: none   LOCAL MEDICATIONS USED:  MARCAINE     SPECIMEN:  No Specimen  DISPOSITION OF SPECIMEN:  N/A  COUNTS:  YES  TOURNIQUET:   Total Tourniquet Time Documented: Upper Arm (Right) - 11 minutes Total: Upper Arm (Right) - 11 minutes   IMPLANTS: None  DICTATION: .Dragon Dictation  Patient was brought to the operating room and after adequate general anesthesia was obtained the right arm was prepped and draped in usual sterile fashion with a tourniquet applied the upper arm.  After patient identification and timeout procedures were completed tourniquet was raised and a transverse incision was made over the radial styloid.  Subcutaneous tissue was spread and the area of compression was identified and there was additionally a ganglion cyst present.  This was drained and then the dorsal compartment was opened proximally and distally until there is complete no further compression on the tendons.  There was a great deal of compression proximally with a very thick extensor retinaculum.  Portion of this was excised as well.  After thorough debridement and release of the tendons the wound was irrigated and 10 cc half percent Marcaine infiltrated for postop analgesia.  After irrigation the wound was closed with simple interrupted 4-0 nylon followed by Xeroform 4 x 4 web roll and Ace wrap.  PLAN OF CARE: Discharge to home after PACU  PATIENT DISPOSITION:  PACU - hemodynamically stable.

## 2022-12-04 ENCOUNTER — Encounter: Payer: Self-pay | Admitting: Orthopedic Surgery

## 2023-01-08 ENCOUNTER — Other Ambulatory Visit: Payer: Self-pay | Admitting: Orthopedic Surgery

## 2023-01-21 ENCOUNTER — Encounter: Payer: Self-pay | Admitting: Orthopedic Surgery

## 2023-01-21 ENCOUNTER — Other Ambulatory Visit: Payer: Self-pay

## 2023-01-28 ENCOUNTER — Other Ambulatory Visit: Payer: Self-pay

## 2023-01-28 ENCOUNTER — Encounter: Payer: Self-pay | Admitting: Orthopedic Surgery

## 2023-01-28 ENCOUNTER — Ambulatory Visit: Payer: Medicare Other | Admitting: Anesthesiology

## 2023-01-28 ENCOUNTER — Encounter
Admission: RE | Disposition: A | Discharge status: Home / Self Care | Payer: Self-pay | Source: Home / Self Care | Attending: Orthopedic Surgery

## 2023-01-28 ENCOUNTER — Ambulatory Visit
Admission: RE | Admit: 2023-01-28 | Discharge: 2023-01-28 | Disposition: A | Discharge status: Home / Self Care | Payer: Medicare Other | Attending: Orthopedic Surgery | Admitting: Orthopedic Surgery

## 2023-01-28 DIAGNOSIS — M1990 Unspecified osteoarthritis, unspecified site: Secondary | ICD-10-CM | POA: Insufficient documentation

## 2023-01-28 DIAGNOSIS — Z96652 Presence of left artificial knee joint: Secondary | ICD-10-CM | POA: Insufficient documentation

## 2023-01-28 DIAGNOSIS — G8929 Other chronic pain: Secondary | ICD-10-CM | POA: Diagnosis present

## 2023-01-28 DIAGNOSIS — E039 Hypothyroidism, unspecified: Secondary | ICD-10-CM | POA: Insufficient documentation

## 2023-01-28 DIAGNOSIS — M24662 Ankylosis, left knee: Secondary | ICD-10-CM | POA: Insufficient documentation

## 2023-01-28 HISTORY — PX: KNEE ARTHROSCOPY: SHX127

## 2023-01-28 HISTORY — DX: Dizziness and giddiness: R42

## 2023-01-28 SURGERY — ARTHROSCOPY, KNEE
Anesthesia: General | Site: Knee | Laterality: Left

## 2023-01-28 MED ORDER — MORPHINE SULFATE (PF) 2 MG/ML IV SOLN: 0.5000 mg | INTRAVENOUS | Status: DC | PRN

## 2023-01-28 MED ORDER — ACETAMINOPHEN 325 MG PO TABS: 325.0000 mg | ORAL_TABLET | Freq: Four times a day (QID) | ORAL | Status: DC | PRN

## 2023-01-28 MED ORDER — ONDANSETRON HCL 4 MG/2ML IJ SOLN: 4.0000 mg | Freq: Four times a day (QID) | INTRAMUSCULAR | Status: DC | PRN

## 2023-01-28 MED ORDER — RINGERS IRRIGATION IR SOLN
Status: DC | PRN
  Administered 2023-01-28: 6000 mL
  Administered 2023-01-28: 3000 mL

## 2023-01-28 MED ORDER — EPHEDRINE SULFATE (PRESSORS) 50 MG/ML IJ SOLN
INTRAMUSCULAR | Status: DC | PRN
  Administered 2023-01-28: 5 mg via INTRAVENOUS

## 2023-01-28 MED ORDER — HYDROCODONE-ACETAMINOPHEN 5-325 MG PO TABS: 1.0000 | ORAL_TABLET | ORAL | 0 refills | Status: DC | PRN

## 2023-01-28 MED ORDER — ONDANSETRON HCL 4 MG/2ML IJ SOLN
INTRAMUSCULAR | Status: DC | PRN
  Administered 2023-01-28: 4 mg via INTRAVENOUS

## 2023-01-28 MED ORDER — OXYCODONE HCL 5 MG/5ML PO SOLN: 5.0000 mg | Freq: Once | ORAL | Status: AC | PRN

## 2023-01-28 MED ORDER — LIDOCAINE HCL (CARDIAC) PF 100 MG/5ML IV SOSY
PREFILLED_SYRINGE | INTRAVENOUS | Status: DC | PRN
  Administered 2023-01-28: 80 mg via INTRATRACHEAL

## 2023-01-28 MED ORDER — LACTATED RINGERS IV SOLN: INTRAVENOUS | Status: DC

## 2023-01-28 MED ORDER — FENTANYL CITRATE (PF) 100 MCG/2ML IJ SOLN
INTRAMUSCULAR | Status: DC | PRN
  Administered 2023-01-28: 25 ug via INTRAVENOUS
  Administered 2023-01-28: 50 ug via INTRAVENOUS
  Administered 2023-01-28: 25 ug via INTRAVENOUS

## 2023-01-28 MED ORDER — ACETAMINOPHEN 10 MG/ML IV SOLN
INTRAVENOUS | Status: DC | PRN
  Administered 2023-01-28: 1000 mg via INTRAVENOUS

## 2023-01-28 MED ORDER — ONDANSETRON HCL 4 MG PO TABS: 4.0000 mg | ORAL_TABLET | Freq: Four times a day (QID) | ORAL | Status: DC | PRN

## 2023-01-28 MED ORDER — OXYCODONE HCL 5 MG PO TABS
5.0000 mg | ORAL_TABLET | Freq: Once | ORAL | Status: AC | PRN
  Administered 2023-01-28: 5 mg via ORAL

## 2023-01-28 MED ORDER — HYDROCODONE-ACETAMINOPHEN 7.5-325 MG PO TABS: 1.0000 | ORAL_TABLET | ORAL | Status: DC | PRN

## 2023-01-28 MED ORDER — CEFAZOLIN SODIUM-DEXTROSE 2-4 GM/100ML-% IV SOLN
2.0000 g | INTRAVENOUS | Status: AC
  Administered 2023-01-28: 2 g via INTRAVENOUS

## 2023-01-28 MED ORDER — PROPOFOL 10 MG/ML IV BOLUS
INTRAVENOUS | Status: DC | PRN
  Administered 2023-01-28: 120 mg via INTRAVENOUS

## 2023-01-28 MED ORDER — SODIUM CHLORIDE 0.9 % IV SOLN: INTRAVENOUS | Status: DC

## 2023-01-28 MED ORDER — DEXAMETHASONE SODIUM PHOSPHATE 4 MG/ML IJ SOLN
INTRAMUSCULAR | Status: DC | PRN
  Administered 2023-01-28: 4 mg via INTRAVENOUS

## 2023-01-28 MED ORDER — HYDROCODONE-ACETAMINOPHEN 5-325 MG PO TABS: 1.0000 | ORAL_TABLET | ORAL | Status: DC | PRN

## 2023-01-28 MED ORDER — FENTANYL CITRATE PF 50 MCG/ML IJ SOSY
25.0000 ug | PREFILLED_SYRINGE | INTRAMUSCULAR | Status: DC | PRN
  Administered 2023-01-28: 50 ug via INTRAVENOUS

## 2023-01-28 MED ORDER — HYDROCODONE-ACETAMINOPHEN 5-325 MG PO TABS: 1.0000 | ORAL_TABLET | Freq: Once | ORAL | Status: DC

## 2023-01-28 SURGICAL SUPPLY — 21 items
APL PRP STRL LF DISP 70% ISPRP (MISCELLANEOUS) ×1
BLADE INCISOR PLUS 4.5 (BLADE) IMPLANT
BLADE SHAVER 4.5X7 STR FR (MISCELLANEOUS) IMPLANT
BNDG ELASTIC 4X5.8 VLCR STR LF (GAUZE/BANDAGES/DRESSINGS) IMPLANT
CHLORAPREP W/TINT 26 (MISCELLANEOUS) ×1 IMPLANT
GAUZE SPONGE 4X4 12PLY STRL (GAUZE/BANDAGES/DRESSINGS) ×1 IMPLANT
GLOVE SURG SYN 9.0  PF PI (GLOVE) ×1
GLOVE SURG SYN 9.0 PF PI (GLOVE) ×1 IMPLANT
GOWN STRL REUS W/ TWL LRG LVL3 (GOWN DISPOSABLE) ×2 IMPLANT
GOWN STRL REUS W/TWL LRG LVL3 (GOWN DISPOSABLE) ×1
IV LACTATED RINGER IRRG 3000ML (IV SOLUTION) ×3
IV LR IRRIG 3000ML ARTHROMATIC (IV SOLUTION) ×2 IMPLANT
KIT TURNOVER KIT A (KITS) ×1 IMPLANT
MANIFOLD NEPTUNE II (INSTRUMENTS) ×2 IMPLANT
PACK ARTHROSCOPY KNEE (MISCELLANEOUS) ×1 IMPLANT
SUT ETHILON 4-0 (SUTURE) ×1
SUT ETHILON 4-0 FS2 18XMFL BLK (SUTURE) ×1
SUTURE ETHLN 4-0 FS2 18XMF BLK (SUTURE) ×1 IMPLANT
TUBING INFLOW SET DBFLO PUMP (TUBING) ×1 IMPLANT
TUBING OUTFLOW SET DBLFO PUMP (TUBING) ×1 IMPLANT
WAND WEREWOLF FLOW 90D (MISCELLANEOUS) IMPLANT

## 2023-01-28 NOTE — Anesthesia Procedure Notes (Signed)
Procedure Name: LMA Insertion Date/Time: 01/28/2023 11:57 AM  Performed by: Moises Blood, CRNAPre-anesthesia Checklist: Patient identified, Emergency Drugs available, Suction available, Patient being monitored and Timeout performed Patient Re-evaluated:Patient Re-evaluated prior to induction Oxygen Delivery Method: Circle system utilized Preoxygenation: Pre-oxygenation with 100% oxygen Induction Type: IV induction LMA: LMA inserted LMA Size: 4.0 Number of attempts: 1 Dental Injury: Teeth and Oropharynx as per pre-operative assessment

## 2023-01-28 NOTE — H&P (Signed)
History of the Present Illness: Jodi [name] is a 82 y.o. female here today.  The patient presents for follow-up evaluation status post left total knee arthroplasty performed in 2023, and is having persistent pain. I had her see Dr. Sharlet Salina for a nerve block, which gave no relief. Her x-rays showed no mechanical problem. She comes in today for re-examination. She subsequently had a de Quervain's release as well.  She states her de Quervain's release is doing well.  She is still having a catching sensation in her left knee. When she sits for 20 minutes or more, when she gets up, she has pain in the posterior aspect of her left knee. She bends over with her walker and walks along until it eases up.  She denies any heart or lung problems.  I have reviewed past medical, surgical, social and family history, and allergies as documented in the EMR.  Past Medical History: Past Medical History: Diagnosis Date Arthritis Benign neoplasm of colon History of chicken pox Hyperlipidemia Other symptoms involving digestive system(787.99) Peripheral neuropathy  Past Surgical History: Past Surgical History: Procedure Laterality Date APPENDECTOMY 1973 HYSTERECTOMY TOTAL ABDOMINAL W/REMOVAL TUBES &/OR OVARIES 1987 Secondary to precancerous lesion. lower back surgery [place] neck surgery 1999 PLANTAR FASCIECTOMY Left 11/1999 KNEE ARTHROSCOPY 01/2006 Left knee CHOLECYSTECTOMY 08/2006 CATARACT EXTRACTION 2012 Bilateral ARTHROPLASTY TOTAL KNEE Left 07/04/2022 By Dr. Rudene Christians Dequervains Release Right 12/03/2022 By Dr. Rudene Christians cervical spine ACDF x2 COLONOSCOPY 11/10/08; 02/25/13 Diverticulosis, internal hemorrhoids INCISION TENDON SHEATH FOR TRIGGER FINGER REDUCTION OVERCORRECTION PTOSIS  Past Family History: Family History Problem Relation Age of Onset Asthma Mother Stroke Maternal Grandmother Diabetes type II Maternal  Grandfather  Medications: Current Outpatient Medications Ordered in Epic Medication Sig Dispense Refill acetaminophen (TYLENOL) 650 MG ER tablet Take 650 mg by mouth every 8 (eight) hours as needed for Pain. apoaequorin (PREVAGEN) capsule Take by mouth calcium carbonate (TUMS) 200 mg calcium (500 mg) chewable tablet Take 2 tablets by mouth as needed cyanocobalamin (VITAMIN B12) 1000 MCG tablet Take 1,000 mcg by mouth once daily docusate (COLACE) 100 MG capsule Take 100 mg by mouth 2 (two) times daily gabapentin (NEURONTIN) 300 MG capsule TAKE 4 CAPSULES BY MOUTH NIGHTLY 120 capsule 2 GLUCOSAM & CHONDROIT-MV & MIN3 (GLUCOSAMINE &CHONDROIT-MV-MIN3 ORAL) Take by mouth. HYDROcodone-acetaminophen (NORCO) 5-325 mg tablet Take 1-2 tablets by mouth every 6 (six) hours as needed 50 tablet 0 L. acidophilus/Bifid. animalis 32 billion cell Cap Take 1 capsule by mouth once daily levothyroxine (SYNTHROID) 88 MCG tablet TAKE 1 TABLET EVERY MORNING WITH A GLASSOF WATER 30 TO 60 MINUTES BEFORE BREAKFAST 180 tablet 1 magnesium oxide,aspartate,citr 400 mg magnesium Cap Take by mouth melatonin 5 mg Tab Take by mouth. methocarbamoL (ROBAXIN) 500 MG tablet Take 500 mg by mouth 4 (four) times daily MULTIVITAMIN ORAL Take 1 tablet by mouth once daily. potassium 99 mg Tab Take by mouth potassium gluconate 2.5 mEq Tab Take 1 tablet by mouth once daily RESTASIS 0.05 % ophthalmic emulsion simethicone 250 mg Cap Take 250 mg by mouth once daily traMADol-acetaminophen (ULTRACET) 37.5-325 mg tablet Take 1 tablet by mouth every 8 (eight) hours as needed for Pain 25 tablet 0  No current Epic-ordered facility-administered medications on file.  Allergies: Allergies Allergen Reactions Oxycodone-Acetaminophen Itching If taken more than a few days in a row REACTION: Itching  Pregabalin Other (See Comments) Double vision   Body mass index is 34.75 kg/m.  Review of Systems: A comprehensive 14 point ROS was  performed, reviewed, and  the pertinent orthopaedic findings are documented in the HPI.  Vitals: 01/03/23 1017 BP: 138/76   General Physical Examination:  General/Constitutional: No apparent distress: well-nourished and well developed. Eyes: Pupils equal, round with synchronous movement. Lungs: Clear to auscultation HEENT: Normal Vascular: No edema, swelling or tenderness, except as noted in detailed exam. Cardiac: Heart rate and rhythm is regular. Integumentary: No impressive skin lesions present, except as noted in detailed exam. Neuro/Psych: Normal mood and affect, oriented to person, place, and time.  On exam, tenderness to the left knee.  Radiographs:  No new imaging studies were obtained or reviewed today.  Assessment: No diagnosis found.  Plan:  The patient has clinical findings of left knee pain status post left total knee arthroplasty.  We discussed the patient's prior x-ray findings. I explained she may have scar tissue that is pinching inside of her left knee. I recommend left knee arthroscopy. I explained the surgery and postoperative course in detail. I wrote her a prescription for new physical therapy.  We will schedule the patient for left knee arthroscopy in the near future.  Surgical Risks:  The nature of the condition and the proposed procedure has been reviewed in detail with the patient. Surgical versus non-surgical options and prognosis for recovery have been reviewed and the inherent risks and benefits of each have been discussed including the risks of infection, bleeding, injury to nerves/blood vessels/tendons, incomplete relief of symptoms, persisting pain and/or stiffness, loss of function, complex regional pain syndrome, failure of the procedure, as appropriate.  Document Attestation: I, Janalyn Rouse, have reviewed and updated documentation for [facility], MD, utilizing [employer] DAX.   Electronically signed by Lauris Poag, MD at 01/05/2023 7:41 PM EST   Reviewed  H+P. No changes noted.  Back to top of Progress Notes

## 2023-01-28 NOTE — Op Note (Signed)
01/28/2023  12:42 PM  PATIENT:  Jodi [name]  82 y.o. 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.562, G89.29  POST-OPERATIVE DIAGNOSIS:  Arthrofibrosis  PROCEDURE:  Procedure(s): Left knee arthroscopic major synovectomy (Left)  SURGEON: Laurene Footman, MD  ASSISTANTS: None  ANESTHESIA:   general  EBL:  Total I/O In: 500 [I.V.:500] Out: 20 [Blood:20]  BLOOD ADMINISTERED:none  DRAINS: none   LOCAL MEDICATIONS USED:  NONE  SPECIMEN:  No Specimen  DISPOSITION OF SPECIMEN:  N/A  COUNTS:  YES  TOURNIQUET:   Total Tourniquet Time Documented: Thigh (Left) - 29 minutes Total: Thigh (Left) - 29 minutes   IMPLANTS: None  DICTATION: .Dragon Dictation   DICTATION: .Dragon Dictation   patient was brought to the operating room and after general anesthesia was obtained the left leg was prepped and draped in the usual sterile fashion with a tourniquet applied the upper thigh.  After patient identification and timeout procedures were completed the 2 portals were made and the trocar placed to free up space to allow for the arthroscopy with the cannula being wiped anteriorly and then to the medial and lateral gutters.  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  This was done changing the portals as well used Pait placing the arthroscope on the medial side and the shaver lateral to allow for adequate resection.  Next the anterior compartment was debrided with use of the shaver removing scar tissue around the implant in the notch and anteriorly.   At the close of the case knee flexion could be brought back to 100 degrees without a firm endpoint.  The knee was irrigated till clear and wounds were closed with simple interrupted 4-0 nylon followed by Xeroform 4 x 4's Webril and Ace wrap.     PLAN OF CARE: Discharge to home after PACU  PATIENT DISPOSITION:  PACU - hemodynamically stable.

## 2023-01-28 NOTE — Transfer of Care (Signed)
Immediate Anesthesia Transfer of Care Note  Patient: Jodi [name]  Procedure(s) Performed: Left knee arthroscopic major synovectomy (Left: Knee)  Patient Location: PACU  Anesthesia Type: General  Level of Consciousness: awake, alert  and patient cooperative  Airway and Oxygen Therapy: Patient Spontanous Breathing and Patient connected to supplemental oxygen  Post-op Assessment: Post-op Vital signs reviewed, Patient's Cardiovascular Status Stable, Respiratory Function Stable, Patent Airway and No signs of Nausea or vomiting  Post-op Vital Signs: Reviewed and stable  Complications: No notable events documented.

## 2023-01-28 NOTE — Discharge Instructions (Signed)
Work on knee motion is much as you can this week  Polar Care if you still have it is much as you can this week Keep dressing clean and dry Pain medicine as directed Call office if you are having problems [phone]

## 2023-01-28 NOTE — Anesthesia Postprocedure Evaluation (Signed)
Anesthesia Post Note  Patient: Jodi [name]  Procedure(s) Performed: Left knee arthroscopic major synovectomy (Left: Knee)  Patient location during evaluation: PACU Anesthesia Type: General Level of consciousness: awake and alert Pain management: pain level controlled Vital Signs Assessment: post-procedure vital signs reviewed and stable Respiratory status: spontaneous breathing, nonlabored ventilation, respiratory function stable and patient connected to nasal cannula oxygen Cardiovascular status: blood pressure returned to baseline and stable Postop Assessment: no apparent nausea or vomiting Anesthetic complications: no  No notable events documented.   Last Vitals:  Vitals:   01/28/23 1300 01/28/23 1313  BP: 115/85 125/61  Pulse: 74 69  Resp: 15 13  Temp: (!) 36.3 C (!) 36.3 C  SpO2: 100% 97%    Last Pain:  Vitals:   01/28/23 1313  TempSrc:   PainSc: 0-No pain                 Dimas Millin

## 2023-01-28 NOTE — Anesthesia Preprocedure Evaluation (Addendum)
Anesthesia Evaluation  Patient identified by MRN, date of birth, ID band Patient awake    Reviewed: Allergy & Precautions, NPO status , Patient's Chart, lab work & pertinent test results  History of Anesthesia Complications (+) PONV and history of anesthetic complications  Airway Mallampati: III  TM Distance: >3 FB Neck ROM: Full    Dental no notable dental hx. (+) Teeth Intact   Pulmonary neg pulmonary ROS, neg sleep apnea, neg COPD, Patient abstained from smoking.Not current smoker   Pulmonary exam normal breath sounds clear to auscultation       Cardiovascular Exercise Tolerance: Good METS(-) hypertension(-) CAD and (-) Past MI negative cardio ROS (-) dysrhythmias  Rhythm:Regular Rate:Normal - Systolic murmurs    Neuro/Psych  PSYCHIATRIC DISORDERS Anxiety     negative neurological ROS     GI/Hepatic ,GERD  ,,(+)     (-) substance abuse    Endo/Other  neg diabetesHypothyroidism    Renal/GU negative Renal ROS     Musculoskeletal  (+) Arthritis ,    Abdominal  (+) + obese  Peds  Hematology   Anesthesia Other Findings Patient with past medical history of dizzyness that started 1 week ago. Patient went to an urgent care. Symptoms started about a week ago. Labs and EKG were taken and all within normal limits. Patient states the dizzyness only starts when she turns her head to the right. Patient denies any nausea or vomiting with the dizzyness. Discussed the risks of general anesthesia and patient understood and agreed.   Past Medical History: No date: Anxiety No date: Arthritis No date: GERD (gastroesophageal reflux disease) No date: Hyperlipidemia No date: Hypothyroidism No date: Neuromuscular disorder (HCC)     Comment:  neuropathy No date: PONV (postoperative nausea and vomiting) No date: Skin cancer  Reproductive/Obstetrics                             Anesthesia  Physical Anesthesia Plan  ASA: 3  Anesthesia Plan: General   Post-op Pain Management: Ofirmev IV (intra-op)   Induction: Intravenous  PONV Risk Score and Plan: 4 or greater and Ondansetron, Dexamethasone and Treatment may vary due to age or medical condition  Airway Management Planned: LMA  Additional Equipment: None  Intra-op Plan:   Post-operative Plan: Extubation in OR  Informed Consent: I have reviewed the patients History and Physical, chart, labs and discussed the procedure including the risks, benefits and alternatives for the proposed anesthesia with the patient or authorized representative who has indicated his/her understanding and acceptance.     Dental advisory given  Plan Discussed with: CRNA and Surgeon  Anesthesia Plan Comments: (Patient consented for risks of anesthesia including but not limited to:  - adverse reactions to medications - damage to eyes, teeth, lips or other oral mucosa - nerve damage due to positioning  - sore throat or hoarseness - Damage to heart, brain, nerves, lungs, other parts of body or loss of life  Patient voiced understanding.)       Anesthesia Quick Evaluation

## 2023-01-29 ENCOUNTER — Encounter: Payer: Self-pay | Admitting: Orthopedic Surgery

## 2023-02-11 ENCOUNTER — Emergency Department: Payer: Medicare Other

## 2023-02-11 ENCOUNTER — Other Ambulatory Visit: Payer: Self-pay

## 2023-02-11 ENCOUNTER — Encounter: Payer: Self-pay | Admitting: Emergency Medicine

## 2023-02-11 ENCOUNTER — Emergency Department
Admission: EM | Admit: 2023-02-11 | Discharge: 2023-02-11 | Disposition: A | Discharge status: Home / Self Care | Payer: Medicare Other | Attending: Emergency Medicine | Admitting: Emergency Medicine

## 2023-02-11 DIAGNOSIS — M546 Pain in thoracic spine: Secondary | ICD-10-CM | POA: Diagnosis not present

## 2023-02-11 DIAGNOSIS — K2940 Chronic atrophic gastritis without bleeding: Secondary | ICD-10-CM | POA: Diagnosis not present

## 2023-02-11 DIAGNOSIS — R072 Precordial pain: Secondary | ICD-10-CM | POA: Diagnosis present

## 2023-02-11 DIAGNOSIS — R7989 Other specified abnormal findings of blood chemistry: Secondary | ICD-10-CM | POA: Insufficient documentation

## 2023-02-11 LAB — CBC
HCT: 42.3 % (ref 36.0–46.0)
Hemoglobin: 13.7 g/dL (ref 12.0–15.0)
MCH: 30.4 pg (ref 26.0–34.0)
MCHC: 32.4 g/dL (ref 30.0–36.0)
MCV: 94 fL (ref 80.0–100.0)
Platelets: 295 10*3/uL (ref 150–400)
RBC: 4.5 MIL/uL (ref 3.87–5.11)
RDW: 13.2 % (ref 11.5–15.5)
WBC: 7.3 10*3/uL (ref 4.0–10.5)
nRBC: 0 % (ref 0.0–0.2)

## 2023-02-11 LAB — BASIC METABOLIC PANEL
Anion gap: 12 (ref 5–15)
BUN: 21 mg/dL (ref 8–23)
CO2: 26 mmol/L (ref 22–32)
Calcium: 9.3 mg/dL (ref 8.9–10.3)
Chloride: 103 mmol/L (ref 98–111)
Creatinine, Ser: 1.06 mg/dL — ABNORMAL HIGH (ref 0.44–1.00)
GFR, Estimated: 52 mL/min — ABNORMAL LOW (ref >60)
Glucose, Bld: 89 mg/dL (ref 70–99)
Potassium: 4.4 mmol/L (ref 3.5–5.1)
Sodium: 141 mmol/L (ref 135–145)

## 2023-02-11 LAB — TROPONIN I (HIGH SENSITIVITY)
Troponin I (High Sensitivity): 4 ng/L (ref <18)
Troponin I (High Sensitivity): 4 ng/L (ref <18)

## 2023-02-11 LAB — LIPASE, BLOOD: Lipase: 32 U/L (ref 11–51)

## 2023-02-11 LAB — HEPATIC FUNCTION PANEL
ALT: 16 U/L (ref 0–44)
AST: 28 U/L (ref 15–41)
Albumin: 3.9 g/dL (ref 3.5–5.0)
Alkaline Phosphatase: 49 U/L (ref 38–126)
Bilirubin, Direct: <0.1 mg/dL (ref 0.0–0.2)
Total Bilirubin: 0.6 mg/dL (ref 0.3–1.2)
Total Protein: 7.3 g/dL (ref 6.5–8.1)

## 2023-02-11 MED ORDER — SUCRALFATE 1 G PO TABS: 1.0000 g | ORAL_TABLET | Freq: Three times a day (TID) | ORAL | 0 refills | Status: DC

## 2023-02-11 MED ORDER — LIDOCAINE 5 % EX PTCH
1.0000 | MEDICATED_PATCH | Freq: Once | CUTANEOUS | Status: DC
  Filled 2023-02-11: qty 1

## 2023-02-11 MED ORDER — SODIUM CHLORIDE 0.9 % IV BOLUS
500.0000 mL | Freq: Once | INTRAVENOUS | Status: AC
  Administered 2023-02-11: 500 mL via INTRAVENOUS

## 2023-02-11 MED ORDER — IOHEXOL 350 MG/ML SOLN
100.0000 mL | Freq: Once | INTRAVENOUS | Status: AC | PRN
  Administered 2023-02-11: 100 mL via INTRAVENOUS

## 2023-02-11 MED ORDER — PANTOPRAZOLE SODIUM 40 MG IV SOLR
40.0000 mg | Freq: Once | INTRAVENOUS | Status: AC
  Administered 2023-02-11: 40 mg via INTRAVENOUS
  Filled 2023-02-11: qty 10

## 2023-02-11 MED ORDER — PANTOPRAZOLE SODIUM 40 MG PO TBEC: 40.0000 mg | DELAYED_RELEASE_TABLET | Freq: Every day | ORAL | 0 refills | Status: DC

## 2023-02-11 MED ORDER — ACETAMINOPHEN 500 MG PO TABS
1000.0000 mg | ORAL_TABLET | Freq: Once | ORAL | Status: AC
  Administered 2023-02-11: 1000 mg via ORAL
  Filled 2023-02-11: qty 2

## 2023-02-11 NOTE — Discharge Instructions (Addendum)
WE are treating you for Gastritis or an ulcer.  You should take Tylenol 1 g every 8 hours to help with pain.  Avoid any ibuprofen or NSAIDs.  Use the Protonix to decrease acid in the stomach and the Carafate to help line the stomach.  Please call the GI number to make a follow-up appointment to see if they would want to do an endoscopy and return to the ER if develop worsening symptoms such as black stools worsening pain or any other concerns  Scattered mild atherosclerotic plaque identified. No dissection or aneurysm formation.   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   Sigmoid colon diverticula.   Subtle wall thickening along the underdistended stomach. Nonspecific. Please correlate with any symptoms.   There are some air in the biliary tree. Please correlate with any instrumentation or surgical change. This was seen previously.

## 2023-02-11 NOTE — ED Triage Notes (Signed)
Patient to ED from [facility] for chest pain. States patient radiates into upper abd. Ongoing for 3 days and thought it was indigestion but not getting better. Denies cardiac history.

## 2023-02-11 NOTE — ED Provider Notes (Addendum)
[facility] Provider Note    Event Date/Time   First MD Initiated Contact with Patient 02/11/23 1643     (approximate)   History   Chest Pain   HPI  Jodi [name] is a 82 y.o. female who is status post a left knee asthroscopy on 2/6 who comes in with concerns for chest pain.  Patient reports some substernal chest pain that radiates into her back.  She reports the pain comes and goes but she still having the back pain.  She reports that it is she thought it could be acid reflux and has been taking Tums but has not really had much improvement which is why she presented today.  She denies any falls and hitting her head.  Patient states that after she had the arthroscopy she was walking out of the OR that day she has not been laying in bed with the last month.  She denies any history of blood clots.  She states that she is not feeling short of breath it has more of just the pain that she has noticed.  Denies having pain like this before ever being told she had acid reflux.  She mostly still has the pain in the mid upper back.   Physical Exam   Triage Vital Signs: ED Triage Vitals  Enc Vitals Group     BP 02/11/23 1408 120/76     Pulse Rate 02/11/23 1408 72     Resp 02/11/23 1408 18     Temp 02/11/23 1408 98.2 F (36.8 C)     Temp Source 02/11/23 1408 Oral     SpO2 02/11/23 1408 96 %     Weight --      Height --      Head Circumference --      Peak Flow --      Pain Score 02/11/23 1407 1     Pain Loc --      Pain Edu? --      Excl. in [place]? --     Most recent vital signs: Vitals:   02/11/23 1408  BP: 120/76  Pulse: 72  Resp: 18  Temp: 98.2 F (36.8 C)  SpO2: 96%     General: Awake, no distress.  CV:  Good peripheral perfusion.  Resp:  Normal effort.  Abd:  No distention.  Other:  Some discomfort in the upper back but no bruising or rash noted.  Some mild epigastric tenderness.  No swelling in legs.  No calf tenderness.   ED Results /  Procedures / Treatments   Labs (all labs ordered are listed, but only abnormal results are displayed) Labs Reviewed  BASIC METABOLIC PANEL - Abnormal; Notable for the following components:      Result Value   Creatinine, Ser 1.06 (*)    GFR, Estimated 52 (*)    All other components within normal limits  CBC  HEPATIC FUNCTION PANEL  LIPASE, BLOOD  URINALYSIS, ROUTINE W REFLEX MICROSCOPIC  TROPONIN I (HIGH SENSITIVITY)  TROPONIN I (HIGH SENSITIVITY)     EKG  My interpretation of EKG:  Normal sinus without any significant ST elevation may be a little bit in lead 3, no T wave version, normal intervals  RADIOLOGY I have reviewed the xray personally and interpreted and no pneumonia   PROCEDURES:  Critical Care performed: No  .1-3 Lead EKG Interpretation  Performed by: Vanessa [place], MD Authorized by: Vanessa Corun[place], MD     Interpretation: normal  ECG rate:  70   ECG rate assessment: normal     Rhythm: sinus rhythm     Ectopy: none     Conduction: normal      MEDICATIONS ORDERED IN ED: Medications  sodium chloride 0.9 % bolus 500 mL (has no administration in time range)  acetaminophen (TYLENOL) tablet 1,000 mg (has no administration in time range)  pantoprazole (PROTONIX) injection 40 mg (has no administration in time range)  lidocaine (LIDODERM) 5 % 1 patch (has no administration in time range)     IMPRESSION / MDM / ASSESSMENT AND PLAN / ED COURSE  I reviewed the triage vital signs and the nursing notes.   Patient's presentation is most consistent with acute presentation with potential threat to life or bodily function.   Patient comes in with chest pain, abdominal pain already had a cholecystectomy.  Is going for 2 days will get CT scan to ensure no signs of dissection obstruction or other acute pathology.  Patient given a little bit of fluids Tylenol Protonix, lidocaine patch to help with symptoms.  Labs ordered evaluate for ACS.  She denies any recent  immobility although she had arthroscopy she was up walking that same day and denies any shortness of breath to suggest PE.  BMP shows slightly elevated creatinine CBC normal troponin negative   CT imaging:  Scattered mild atherosclerotic plaque identified. No dissection or aneurysm formation.   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   Sigmoid colon diverticula.   Subtle wall thickening along the underdistended stomach. Nonspecific. Please correlate with any symptoms.   There are some air in the biliary tree. Please correlate with any instrumentation or surgical change. This was seen previously.  (Pt has had gallbladder removed) no RUQ pain   Repeat troponin is negative.  Given patient's epigastric pain along with a CT scan showing some wall thickening of the stomach I suspect that this could be some kind of gastritis versus ulcer.  Will start patient on PPI, Carafate and continue to avoid any NSAIDs.  I considered admission but she denies any black tarry stools or blood in her stool and her hemoglobin is stable no indication for GI bleed.  We discussed return precautions in regards to this or worsening pain and she expressed understanding.   I discussed the provisional nature of ED diagnosis, the treatment so far, the ongoing plan of care, follow up appointments and return precautions with the patient and any family or support people present. They expressed understanding and agreed with the plan, discharged home.  Patient denies any urinary symptoms and did not want to stay any longer for UA  The patient is on the cardiac monitor to evaluate for evidence of arrhythmia and/or significant heart rate changes.      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   Vanessa [place], MD 02/11/23 2015    Marjean Donna  E, MD 02/11/23 2015

## 2023-05-04 NOTE — H&P (Signed)
Pre-Procedure H&P   Patient ID: Jodi [name] is a 82 y.o. female.  Gastroenterology Provider: Jaynie Collins, DO  Referring Provider: Jacob Moores, PA PCP: Dorothey Baseman, MD  Date: 05/05/2023  HPI Jodi [name] is a 82 y.o. female who presents today for Esophagogastroduodenoscopy for Epigastric pain, dysphagia, GERD, atypical chest pain .  Patient notes 3 months of chest discomfort around the xiphoid process that radiates to the throat into the back.  She notes dysphagia to solids and liquids with odynophagia.  Reports upper sternal sticking but without regurgitation.  She has decreased appetite but no changes in weight.  She has trouble with large pills as well.  She has not previously undergone endoscopy.  She does have a history of C-spine intervention. Currently not taking nexium, but when she did, it improved her symptoms.  H. pylori breath testing was negative.  She has noted increasing reflux symptoms and has not started PPI when initially prescribed by her PCP.  CT in February demonstrated gastric wall thickening and sigmoid diverticulosis as well as pancreas atrophy and increased stool burden.  Last underwent colonoscopy in 2009 demonstrated diverticulosis and internal hemorrhoids.  Status postcholecystectomy and hysterectomy.  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   PONV (postoperative nausea and vomiting)    long time ago, only one time   Vertigo    started recently, may need a steadying hand    Past Surgical History:  Procedure Laterality Date   ABDOMINAL HYSTERECTOMY     APPENDECTOMY     BACK SURGERY     CHOLECYSTECTOMY     COLONOSCOPY     DORSAL COMPARTMENT RELEASE Right 12/03/2022   Procedure: RELEASE DORSAL COMPARTMENT  (DEQUERVAIN);  Surgeon: Kennedy Bucker, MD;  Location: [facility];  Service: Orthopedics;  Laterality: Right;   ESOPHAGOGASTRODUODENOSCOPY     EYE SURGERY     HAND SURGERY Bilateral    KNEE ARTHROSCOPY Left 01/28/2023   Procedure: Left knee arthroscopic major synovectomy;  Surgeon: Kennedy Bucker, MD;  Location: [facility];  Service: Orthopedics;  Laterality: Left;   KNEE SURGERY     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 Negative for activity change, chills, diaphoresis, fatigue, fever and unexpected weight change.  HENT:  Positive for trouble swallowing. Negative for voice change.   Respiratory:  Negative for shortness of breath and wheezing.   Cardiovascular:  Positive for chest pain. Negative for palpitations and leg swelling.  Gastrointestinal:  Positive for abdominal pain. Negative for abdominal distention, anal bleeding, blood in stool, constipation, diarrhea, nausea, rectal pain and vomiting.       +foul smelling breath  Musculoskeletal:  Negative for arthralgias and myalgias.  Skin:  Negative for color change and pallor.  Neurological:  Negative for dizziness, syncope and weakness.  Psychiatric/Behavioral:  Negative for confusion.   All other systems reviewed and are negative.    Medications No current facility-administered medications on file prior to encounter.   Current Outpatient Medications on  File Prior to Encounter  Medication Sig Dispense Refill   acidophilus (RISAQUAD) CAPS capsule Take 1 capsule by mouth daily.     Apoaequorin (PREVAGEN PO) Take 1 capsule by mouth daily.     calcium carbonate (TUMS - DOSED IN MG ELEMENTAL CALCIUM) 500 MG chewable tablet Chew 2 tablets by mouth as needed for indigestion  or heartburn.     cholecalciferol (VITAMIN D3) 25 MCG (1000 UNIT) tablet Take 1,000 Units by mouth daily.     cycloSPORINE (RESTASIS) 0.05 % ophthalmic emulsion Place 1 drop into both eyes 2 (two) times daily.     docusate sodium (COLACE) 100 MG capsule Take 1 capsule (100 mg total) by mouth 2 (two) times daily. (Patient taking differently: Take 100 mg by mouth at bedtime.) 10 capsule 0   gabapentin (NEURONTIN) 300 MG capsule Take 300 mg by mouth 3 (three) times daily. Take 3 -4 caps nightly as needed     HYDROcodone-acetaminophen (NORCO/VICODIN) 5-325 MG tablet Take 1-2 tablets by mouth every 4 (four) hours as needed for moderate pain (pain score 4-6). 30 tablet 0   levothyroxine (SYNTHROID, LEVOTHROID) 75 MCG tablet Take 88 mcg by mouth daily. Increased to 88 mcg     magnesium oxide (MAG-OX) 400 (240 Mg) MG tablet Take 400 mg by mouth daily.     meclizine (ANTIVERT) 12.5 MG tablet Take 12.5 mg by mouth 3 (three) times daily as needed for dizziness.     Melatonin 5 MG TABS Take 1 tablet by mouth at bedtime.      Misc Natural Products (GLUCOSAMINE CHOND CMP ADVANCED PO) Take 1 capsule by mouth daily.     Multiple Vitamin (MULTIVITAMIN) capsule Take 1 capsule by mouth daily.     OVER THE COUNTER MEDICATION Take 2 capsules by mouth daily. Beet Root     pyridOXINE (VITAMIN B6) 100 MG tablet Take 100 mg by mouth daily.     traMADol-acetaminophen (ULTRACET) 37.5-325 MG tablet Take 1 tablet by mouth every 8 (eight) hours as needed for moderate pain.     UNABLE TO FIND Take 2 tablets by mouth QID.  [place]ands Leg cramps     vitamin B-12 (CYANOCOBALAMIN) 1000 MCG tablet Take 1,000 mcg by mouth daily.     enoxaparin (LOVENOX) 40 MG/0.4ML injection Inject 0.4 mLs (40 mg total) into the skin daily for 14 days. 5.6 mL 0   pantoprazole (PROTONIX) 40 MG tablet Take 1 tablet (40 mg total) by mouth daily for 14 days. 14 tablet 0   Potassium 99 MG TABS Take 1 tablet by mouth daily. (Patient not taking: Reported on  01/21/2023)     sucralfate (CARAFATE) 1 g tablet Take 1 tablet (1 g total) by mouth 4 (four) times daily -  with meals and at bedtime for 14 days. 56 tablet 0    Pertinent medications related to GI and procedure were reviewed by me with the patient prior to the procedure   Current Facility-Administered Medications:    0.9 %  sodium chloride infusion, , Intravenous, Continuous, Jaynie Collins, DO, Last Rate: 20 mL/hr at 05/05/23 1216, New Bag at 05/05/23 1216  sodium chloride 20 mL/hr at 05/05/23 1216       Allergies  Allergen Reactions   Lyrica [Pregabalin]    Oxycodone-Acetaminophen     REACTION: Itching   Allergies were reviewed by me prior to the procedure  Objective   Body mass index is 34.2 kg/m. Vitals:   05/05/23 1203  BP: 97/72  Pulse: 84  Resp: 18  Temp: (!) 97.3 F (36.3 C)  TempSrc: Temporal  SpO2: 97%  Weight: 84.8 kg  Height: 5\' 2"  (1.575 m)     Physical Exam Vitals and nursing note reviewed.  Constitutional:      General: She is not in acute distress.    Appearance: Normal appearance. She is obese. She is not ill-appearing, toxic-appearing or diaphoretic.  HENT:     Head: Normocephalic and atraumatic.     Nose: Nose normal.     Mouth/Throat:     Mouth: Mucous membranes are moist.     Pharynx: Oropharynx is clear.  Eyes:     General: No scleral icterus.    Extraocular Movements: Extraocular movements intact.  Cardiovascular:     Rate and Rhythm: Normal rate and regular rhythm.     Heart sounds: Normal heart sounds. No murmur heard.    No friction rub. No gallop.  Pulmonary:     Effort: Pulmonary effort is normal. No respiratory distress.     Breath sounds: Normal breath sounds. No wheezing, rhonchi or rales.  Abdominal:     General: Bowel sounds are normal. There is no distension.     Palpations: Abdomen is soft.     Tenderness: There is no abdominal tenderness. There is no guarding or rebound.  Musculoskeletal:     Cervical back:  Neck supple.  Skin:    General: Skin is warm and dry.     Coloration: Skin is not jaundiced or pale.  Neurological:     General: No focal deficit present.     Mental Status: She is alert and oriented to person, place, and time. Mental status is at baseline.  Psychiatric:        Mood and Affect: Mood normal.        Behavior: Behavior normal.        Thought Content: Thought content normal.        Judgment: Judgment normal.      Assessment:  Jodi [name] is a 82 y.o. female  who presents today for Esophagogastroduodenoscopy for Epigastric pain, dysphagia, GERD, atypical chest pain .  Plan:  Esophagogastroduodenoscopy with possible intervention today  Esophagogastroduodenoscopy with possible biopsy, control of bleeding, polypectomy, and interventions as necessary has been discussed with the patient/patient representative. Informed consent was obtained from the patient/patient representative after explaining the indication, nature, and risks of the procedure including but not limited to death, bleeding, perforation, missed neoplasm/lesions, cardiorespiratory compromise, and reaction to medications. Opportunity for questions was given and appropriate answers were provided. Patient/patient representative has verbalized understanding is amenable to undergoing the procedure.   Jaynie Collins, DO  [facility]  Portions of the record may have been created with voice recognition software. Occasional wrong-word or 'sound-a-like' substitutions may have occurred due to the inherent limitations of voice recognition software.  Read the chart carefully and recognize, using context, where substitutions may have occurred.

## 2023-05-05 ENCOUNTER — Other Ambulatory Visit: Payer: Self-pay

## 2023-05-05 ENCOUNTER — Ambulatory Visit: Payer: Medicare Other | Admitting: Anesthesiology

## 2023-05-05 ENCOUNTER — Encounter
Admission: RE | Disposition: A | Discharge status: Home / Self Care | Payer: Self-pay | Source: Home / Self Care | Attending: Gastroenterology

## 2023-05-05 ENCOUNTER — Encounter: Payer: Self-pay | Admitting: Gastroenterology

## 2023-05-05 ENCOUNTER — Ambulatory Visit
Admission: RE | Admit: 2023-05-05 | Discharge: 2023-05-05 | Disposition: A | Discharge status: Home / Self Care | Payer: Medicare Other | Attending: Gastroenterology | Admitting: Gastroenterology

## 2023-05-05 DIAGNOSIS — K224 Dyskinesia of esophagus: Secondary | ICD-10-CM | POA: Insufficient documentation

## 2023-05-05 DIAGNOSIS — Z6834 Body mass index (BMI) 34.0-34.9, adult: Secondary | ICD-10-CM | POA: Insufficient documentation

## 2023-05-05 DIAGNOSIS — K2960 Other gastritis without bleeding: Secondary | ICD-10-CM | POA: Diagnosis not present

## 2023-05-05 DIAGNOSIS — R1013 Epigastric pain: Secondary | ICD-10-CM | POA: Insufficient documentation

## 2023-05-05 DIAGNOSIS — K219 Gastro-esophageal reflux disease without esophagitis: Secondary | ICD-10-CM | POA: Insufficient documentation

## 2023-05-05 DIAGNOSIS — R0789 Other chest pain: Secondary | ICD-10-CM | POA: Diagnosis not present

## 2023-05-05 DIAGNOSIS — Z9049 Acquired absence of other specified parts of digestive tract: Secondary | ICD-10-CM | POA: Insufficient documentation

## 2023-05-05 DIAGNOSIS — R1310 Dysphagia, unspecified: Secondary | ICD-10-CM | POA: Diagnosis not present

## 2023-05-05 DIAGNOSIS — E039 Hypothyroidism, unspecified: Secondary | ICD-10-CM | POA: Insufficient documentation

## 2023-05-05 DIAGNOSIS — E669 Obesity, unspecified: Secondary | ICD-10-CM | POA: Diagnosis not present

## 2023-05-05 HISTORY — PX: ESOPHAGOGASTRODUODENOSCOPY (EGD) WITH PROPOFOL: SHX5813

## 2023-05-05 SURGERY — ESOPHAGOGASTRODUODENOSCOPY (EGD) WITH PROPOFOL
Anesthesia: General

## 2023-05-05 MED ORDER — LIDOCAINE HCL (CARDIAC) PF 100 MG/5ML IV SOSY
PREFILLED_SYRINGE | INTRAVENOUS | Status: DC | PRN
  Administered 2023-05-05: 50 mg via INTRAVENOUS

## 2023-05-05 MED ORDER — PROPOFOL 10 MG/ML IV BOLUS
INTRAVENOUS | Status: DC | PRN
  Administered 2023-05-05: 70 mg via INTRAVENOUS

## 2023-05-05 MED ORDER — SODIUM CHLORIDE 0.9 % IV SOLN: INTRAVENOUS | Status: DC

## 2023-05-05 MED ORDER — PROPOFOL 500 MG/50ML IV EMUL
INTRAVENOUS | Status: DC | PRN
  Administered 2023-05-05: 125 ug/kg/min via INTRAVENOUS

## 2023-05-05 NOTE — Transfer of Care (Signed)
Immediate Anesthesia Transfer of Care Note  Patient: Jodi [name]  Procedure(s) Performed: ESOPHAGOGASTRODUODENOSCOPY (EGD) WITH PROPOFOL  Patient Location: PACU and Endoscopy Unit  Anesthesia Type:General  Level of Consciousness: sedated and responds to stimulation  Airway & Oxygen Therapy: Patient Spontanous Breathing and Patient connected to face mask oxygen  Post-op Assessment: Report given to RN and Post -op Vital signs reviewed and stable  Post vital signs: Reviewed and stable  Last Vitals:  Vitals Value Taken Time  BP 100/51 05/05/23 1304  Temp 36.8 C 05/05/23 1303  Pulse 80 05/05/23 1305  Resp 23 05/05/23 1305  SpO2 96 % 05/05/23 1305  Vitals shown include unvalidated device data.  Last Pain:  Vitals:   05/05/23 1303  TempSrc: Temporal  PainSc: Asleep         Complications: No notable events documented.

## 2023-05-05 NOTE — Anesthesia Postprocedure Evaluation (Signed)
Anesthesia Post Note  Patient: Jodi [name]  Procedure(s) Performed: ESOPHAGOGASTRODUODENOSCOPY (EGD) WITH PROPOFOL  Patient location during evaluation: PACU Anesthesia Type: General Level of consciousness: awake and alert, oriented and patient cooperative Pain management: pain level controlled Vital Signs Assessment: post-procedure vital signs reviewed and stable Respiratory status: spontaneous breathing, nonlabored ventilation and respiratory function stable Cardiovascular status: blood pressure returned to baseline and stable Postop Assessment: adequate PO intake Anesthetic complications: no   No notable events documented.   Last Vitals:  Vitals:   05/05/23 1303 05/05/23 1323  BP:  107/65  Pulse:    Resp: 20   Temp: 36.8 C   SpO2:      Last Pain:  Vitals:   05/05/23 1323  TempSrc:   PainSc: 0-No pain                 Reed Breech

## 2023-05-05 NOTE — Interval H&P Note (Signed)
History and Physical Interval Note: Preprocedure H&P from 05/05/23  was reviewed and there was no interval change after seeing and examining the patient.  Written consent was obtained from the patient after discussion of risks, benefits, and alternatives. Patient has consented to proceed with Esophagogastroduodenoscopy with possible intervention   05/05/2023 12:39 PM  Jodi [name]  has presented today for surgery, with the diagnosis of  789.06 (ICD-9-CM) - R10.13 (ICD-10-CM) - Epigastric pain  787.29 (ICD-9-CM) - R13.19 (ICD-10-CM) - Esophageal dysphagia  786.59 (ICD-9-CM) - R07.89 (ICD-10-CM) - Atypical chest pain  530.81 (ICD-9-CM) - K21.9 (ICD-10-CM) - Gastroesophageal reflux disease, unspecified whether esophagitis present  783.0 (ICD-9-CM) - R63.0 (ICD-10-CM) - Loss of appetite.  The various methods of treatment have been discussed with the patient and family. After consideration of risks, benefits and other options for treatment, the patient has consented to  Procedure(s): ESOPHAGOGASTRODUODENOSCOPY (EGD) WITH PROPOFOL (N/A) as a surgical intervention.  The patient's history has been reviewed, patient examined, no change in status, stable for surgery.  I have reviewed the patient's chart and labs.  Questions were answered to the patient's satisfaction.     Jaynie Collins

## 2023-05-05 NOTE — Op Note (Addendum)
[facility] Gastroenterology Patient Name: Jenalynn Iler Procedure Date: 05/05/2023 12:35 PM MRN: [id number] Account #: [id number] Date of Birth: Mar 14, 1941 Admit Type: Outpatient Age: 82 Room: [facility] ENDO ROOM 2 Gender: Female Note Status: Finalized Instrument Name: Upper Endoscope [id number] Procedure:             Upper GI endoscopy Indications:           Epigastric abdominal pain, Dysphagia Providers:             Trenda Moots, DO Referring MD:          Jaynie Collins DO, DO (Referring MD), Teena Irani.                         Terance Hart, MD (Referring MD) Medicines:             Monitored Anesthesia Care Complications:         No immediate complications. Estimated blood loss:                         Minimal. Procedure:             Pre-Anesthesia Assessment:                        - Prior to the procedure, a History and Physical was                         performed, and patient medications and allergies were                         reviewed. The patient is competent. The risks and                         benefits of the procedure and the sedation options and                         risks were discussed with the patient. All questions                         were answered and informed consent was obtained.                         Patient identification and proposed procedure were                         verified by the physician, the nurse, the anesthetist                         and the technician in the endoscopy suite. Mental                         Status Examination: alert and oriented. Airway                         Examination: normal oropharyngeal airway and neck                         mobility. Respiratory Examination: clear to  auscultation. CV Examination: RRR, no murmurs, no S3                         or S4. Prophylactic Antibiotics: The patient does not                         require prophylactic antibiotics. Prior                          Anticoagulants: The patient has taken no anticoagulant                         or antiplatelet agents. ASA Grade Assessment: III - A                         patient with severe systemic disease. After reviewing                         the risks and benefits, the patient was deemed in                         satisfactory condition to undergo the procedure. The                         anesthesia plan was to use monitored anesthesia care                         (MAC). Immediately prior to administration of                         medications, the patient was re-assessed for adequacy                         to receive sedatives. The heart rate, respiratory                         rate, oxygen saturations, blood pressure, adequacy of                         pulmonary ventilation, and response to care were                         monitored throughout the procedure. The physical                         status of the patient was re-assessed after the                         procedure.                        After obtaining informed consent, the endoscope was                         passed under direct vision. Throughout the procedure,                         the patient's blood pressure, pulse, and oxygen  saturations were monitored continuously. The Endoscope                         was introduced through the mouth, and advanced to the                         second part of duodenum. The upper GI endoscopy was                         accomplished without difficulty. The patient tolerated                         the procedure well. Findings:      The duodenal bulb, first portion of the duodenum and second portion of       the duodenum were normal. Estimated blood loss: none.      Diffuse mild inflammation characterized by erythema was found in the       entire examined stomach. Biopsies were taken with a cold forceps for       histology. Biopsies were  taken with a cold forceps for Helicobacter       pylori testing. Estimated blood loss was minimal.      The exam of the stomach was otherwise normal.      Esophagogastric landmarks were identified: the gastroesophageal junction       was found at 36 cm from the incisors.      The Z-line was regular.      Abnormal motility was noted in the esophagus. The cricopharyngeus was       normal. There is spasticity of the esophageal body. The distal       esophagus/lower esophageal sphincter is open. Tertiary peristaltic waves       are noted. The scope was withdrawn. Dilation was performed with a       Maloney dilator with no resistance at 52 Fr. The dilation site was       examined following endoscope reinsertion and showed mild mucosal       disruption. Estimated blood loss was minimal.      Normal mucosa was found in the entire esophagus. Biopsies were taken       with a cold forceps for histology. Estimated blood loss was minimal.      The exam of the esophagus was otherwise normal. Impression:            - Normal duodenal bulb, first portion of the duodenum                         and second portion of the duodenum.                        - Bile gastritis. Biopsied.                        - Esophagogastric landmarks identified.                        - Z-line regular.                        - Abnormal esophageal motility, suspicious for  presbyesophagus. Dilated.                        - Normal mucosa was found in the entire esophagus.                         Biopsied. Recommendation:        - Patient has a contact number available for                         emergencies. The signs and symptoms of potential                         delayed complications were discussed with the patient.                         Return to normal activities tomorrow. Written                         discharge instructions were provided to the patient.                        - Discharge  patient to home.                        - Soft diet today.                        - Continue present medications.                        - Restart esomeprazole/nexium for reflux.                        Consider bile acid sequestrant considering bile acid                         induced gastritis.                        Consider esophagram to rule out zenkers given report                         of foul smelling breath and dysphagia. One was not                         appreciated on EGD.                        - Repeat upper endoscopy PRN for retreatment.                        - Return to GI office as previously scheduled.                        - The findings and recommendations were discussed with                         the patient. Procedure Code(s):     --- Professional ---  16109, Esophagogastroduodenoscopy, flexible,                         transoral; with biopsy, single or multiple                        43450, Dilation of esophagus, by unguided sound or                         bougie, single or multiple passes Diagnosis Code(s):     --- Professional ---                        K29.60, Other gastritis without bleeding                        K22.4, Dyskinesia of esophagus                        R10.13, Epigastric pain                        R13.10, Dysphagia, unspecified CPT copyright 2022 American Medical Association. All rights reserved. The codes documented in this report are preliminary and upon coder review may  be revised to meet current compliance requirements. Attending Participation:      I personally performed the entire procedure. Elfredia Nevins, DO Jaynie Collins DO, DO 05/05/2023 1:02:25 PM This report has been signed electronically. Number of Addenda: 0 Note Initiated On: 05/05/2023 12:35 PM Estimated Blood Loss:  Estimated blood loss was minimal.      [facility]

## 2023-05-05 NOTE — Anesthesia Preprocedure Evaluation (Addendum)
Anesthesia Evaluation  Patient identified by MRN, date of birth, ID band Patient awake    Reviewed: Allergy & Precautions, NPO status , Patient's Chart, lab work & pertinent test results  History of Anesthesia Complications (+) PONV and history of anesthetic complications  Airway Mallampati: III   Neck ROM: Full    Dental  (+) Missing   Pulmonary neg pulmonary ROS   Pulmonary exam normal breath sounds clear to auscultation       Cardiovascular Exercise Tolerance: Good Normal cardiovascular exam Rhythm:Regular Rate:Normal  ECG 02/11/23: NSR, low voltage, no STEMI   Neuro/Psych  PSYCHIATRIC DISORDERS Anxiety     Vertigo     GI/Hepatic ,GERD  ,,  Endo/Other  Hypothyroidism  Obesity   Renal/GU negative Renal ROS     Musculoskeletal  (+) Arthritis ,    Abdominal   Peds  Hematology negative hematology ROS (+)   Anesthesia Other Findings   Reproductive/Obstetrics                             Anesthesia Physical Anesthesia Plan  ASA: 2  Anesthesia Plan: General   Post-op Pain Management:    Induction: Intravenous  PONV Risk Score and Plan: 4 or greater and Propofol infusion, TIVA and Treatment may vary due to age or medical condition  Airway Management Planned: Natural Airway  Additional Equipment:   Intra-op Plan:   Post-operative Plan:   Informed Consent: I have reviewed the patients History and Physical, chart, labs and discussed the procedure including the risks, benefits and alternatives for the proposed anesthesia with the patient or authorized representative who has indicated his/her understanding and acceptance.       Plan Discussed with: CRNA  Anesthesia Plan Comments: (LMA/GETA backup discussed.  Patient consented for risks of anesthesia including but not limited to:  - adverse reactions to medications - damage to eyes, teeth, lips or other oral mucosa - nerve  damage due to positioning  - sore throat or hoarseness - damage to heart, brain, nerves, lungs, other parts of body or loss of life  Informed patient about role of CRNA in peri- and intra-operative care.  Patient voiced understanding.)        Anesthesia Quick Evaluation

## 2023-05-06 ENCOUNTER — Encounter: Payer: Self-pay | Admitting: Gastroenterology

## 2023-05-07 LAB — SURGICAL PATHOLOGY

## 2023-09-15 ENCOUNTER — Other Ambulatory Visit: Payer: Self-pay | Admitting: General Surgery

## 2023-09-15 DIAGNOSIS — K219 Gastro-esophageal reflux disease without esophagitis: Secondary | ICD-10-CM

## 2023-09-15 DIAGNOSIS — K2289 Other specified disease of esophagus: Secondary | ICD-10-CM

## 2023-09-15 DIAGNOSIS — K2960 Other gastritis without bleeding: Secondary | ICD-10-CM

## 2023-09-19 ENCOUNTER — Other Ambulatory Visit: Payer: Self-pay | Admitting: General Surgery

## 2023-09-19 ENCOUNTER — Ambulatory Visit
Admission: RE | Admit: 2023-09-19 | Discharge: 2023-09-19 | Disposition: A | Discharge status: Home / Self Care | Payer: Medicare Other | Source: Ambulatory Visit | Attending: General Surgery | Admitting: General Surgery

## 2023-09-19 DIAGNOSIS — K2289 Other specified disease of esophagus: Secondary | ICD-10-CM

## 2023-09-19 DIAGNOSIS — K219 Gastro-esophageal reflux disease without esophagitis: Secondary | ICD-10-CM | POA: Diagnosis present

## 2023-09-19 DIAGNOSIS — K2960 Other gastritis without bleeding: Secondary | ICD-10-CM | POA: Diagnosis present

## 2023-10-01 ENCOUNTER — Other Ambulatory Visit: Payer: Self-pay | Admitting: Orthopedic Surgery

## 2023-10-01 DIAGNOSIS — M4807 Spinal stenosis, lumbosacral region: Secondary | ICD-10-CM

## 2023-10-17 ENCOUNTER — Ambulatory Visit
Admission: RE | Admit: 2023-10-17 | Discharge: 2023-10-17 | Disposition: A | Discharge status: Home / Self Care | Payer: Medicare Other | Source: Ambulatory Visit | Attending: Orthopedic Surgery | Admitting: Orthopedic Surgery

## 2023-10-17 DIAGNOSIS — M4807 Spinal stenosis, lumbosacral region: Secondary | ICD-10-CM

## 2023-12-12 ENCOUNTER — Other Ambulatory Visit: Payer: Self-pay | Admitting: Sports Medicine

## 2023-12-12 DIAGNOSIS — M2391 Unspecified internal derangement of right knee: Secondary | ICD-10-CM

## 2023-12-12 DIAGNOSIS — M25461 Effusion, right knee: Secondary | ICD-10-CM

## 2023-12-12 DIAGNOSIS — M11261 Other chondrocalcinosis, right knee: Secondary | ICD-10-CM | POA: Insufficient documentation

## 2023-12-18 ENCOUNTER — Ambulatory Visit
Admission: RE | Admit: 2023-12-18 | Discharge: 2023-12-18 | Disposition: A | Discharge status: Home / Self Care | Payer: Medicare Other | Source: Ambulatory Visit | Attending: Sports Medicine | Admitting: Sports Medicine

## 2023-12-18 DIAGNOSIS — M2391 Unspecified internal derangement of right knee: Secondary | ICD-10-CM

## 2023-12-18 DIAGNOSIS — M25461 Effusion, right knee: Secondary | ICD-10-CM

## 2024-02-19 ENCOUNTER — Other Ambulatory Visit: Payer: Self-pay | Admitting: Orthopedic Surgery

## 2024-02-26 ENCOUNTER — Inpatient Hospital Stay: Admission: RE | Admit: 2024-02-26 | Source: Ambulatory Visit

## 2024-02-27 ENCOUNTER — Other Ambulatory Visit: Payer: Self-pay

## 2024-02-27 ENCOUNTER — Encounter
Admission: RE | Admit: 2024-02-27 | Discharge: 2024-02-27 | Disposition: A | Discharge status: Home / Self Care | Source: Ambulatory Visit | Attending: Orthopedic Surgery | Admitting: Orthopedic Surgery

## 2024-02-27 VITALS — BP 116/67 | Temp 97.8°F | Resp 20 | Ht 62.0 in | Wt 191.5 lb

## 2024-02-27 DIAGNOSIS — M1711 Unilateral primary osteoarthritis, right knee: Secondary | ICD-10-CM | POA: Insufficient documentation

## 2024-02-27 DIAGNOSIS — Z01818 Encounter for other preprocedural examination: Secondary | ICD-10-CM | POA: Diagnosis present

## 2024-02-27 DIAGNOSIS — Z01812 Encounter for preprocedural laboratory examination: Secondary | ICD-10-CM

## 2024-02-27 HISTORY — DX: Unilateral primary osteoarthritis, right knee: M17.11

## 2024-02-27 LAB — CBC WITH DIFFERENTIAL/PLATELET
Abs Immature Granulocytes: 0.02 10*3/uL (ref 0.00–0.07)
Basophils Absolute: 0.1 10*3/uL (ref 0.0–0.1)
Basophils Relative: 1 %
Eosinophils Absolute: 0.4 10*3/uL (ref 0.0–0.5)
Eosinophils Relative: 6 %
HCT: 42.9 % (ref 36.0–46.0)
Hemoglobin: 14 g/dL (ref 12.0–15.0)
Immature Granulocytes: 0 %
Lymphocytes Relative: 31 %
Lymphs Abs: 2 10*3/uL (ref 0.7–4.0)
MCH: 31 pg (ref 26.0–34.0)
MCHC: 32.6 g/dL (ref 30.0–36.0)
MCV: 95.1 fL (ref 80.0–100.0)
Monocytes Absolute: 0.6 10*3/uL (ref 0.1–1.0)
Monocytes Relative: 9 %
Neutro Abs: 3.4 10*3/uL (ref 1.7–7.7)
Neutrophils Relative %: 53 %
Platelets: 304 10*3/uL (ref 150–400)
RBC: 4.51 MIL/uL (ref 3.87–5.11)
RDW: 12.8 % (ref 11.5–15.5)
WBC: 6.5 10*3/uL (ref 4.0–10.5)
nRBC: 0 % (ref 0.0–0.2)

## 2024-02-27 LAB — URINALYSIS, ROUTINE W REFLEX MICROSCOPIC
Bilirubin Urine: NEGATIVE
Glucose, UA: NEGATIVE mg/dL
Hgb urine dipstick: NEGATIVE
Ketones, ur: NEGATIVE mg/dL
Nitrite: NEGATIVE
Protein, ur: NEGATIVE mg/dL
Specific Gravity, Urine: 1.018 (ref 1.005–1.030)
pH: 5 (ref 5.0–8.0)

## 2024-02-27 LAB — SURGICAL PCR SCREEN
MRSA, PCR: NEGATIVE
Staphylococcus aureus: POSITIVE — AB

## 2024-02-27 LAB — COMPREHENSIVE METABOLIC PANEL
ALT: 18 U/L (ref 0–44)
AST: 23 U/L (ref 15–41)
Albumin: 4 g/dL (ref 3.5–5.0)
Alkaline Phosphatase: 56 U/L (ref 38–126)
Anion gap: 11 (ref 5–15)
BUN: 23 mg/dL (ref 8–23)
CO2: 25 mmol/L (ref 22–32)
Calcium: 9.5 mg/dL (ref 8.9–10.3)
Chloride: 105 mmol/L (ref 98–111)
Creatinine, Ser: 1.06 mg/dL — ABNORMAL HIGH (ref 0.44–1.00)
GFR, Estimated: 52 mL/min — ABNORMAL LOW (ref >60)
Glucose, Bld: 88 mg/dL (ref 70–99)
Potassium: 4.2 mmol/L (ref 3.5–5.1)
Sodium: 141 mmol/L (ref 135–145)
Total Bilirubin: 0.6 mg/dL (ref 0.0–1.2)
Total Protein: 7.1 g/dL (ref 6.5–8.1)

## 2024-02-27 NOTE — Patient Instructions (Addendum)
 Your procedure is scheduled on: 3/13/ 2025 Thursday Report to the Registration Desk on the 1st floor of the [facility] To find out your arrival time, please call [phone] between 1PM - 3PM on:  03/03/2024 Wednesday  If your arrival time is 6:00 am, do not arrive before that time as the [facility] entrance doors do not open until 6:00 am.  REMEMBER: Instructions that are not followed completely may result in serious medical risk, up to and including death; or upon the discretion of your surgeon and anesthesiologist your surgery may need to be rescheduled.  Do not eat food after midnight the night before surgery.  No gum chewing or hard candies.  You may however, drink CLEAR liquids up to 2 hours before you are scheduled to arrive for your surgery. Do not drink anything within 2 hours of your scheduled arrival time.  Clear liquids include: - water  - apple juice without pulp - gatorade (not RED colors) - black coffee or tea (Do NOT add milk or creamers to the coffee or tea) Do NOT drink anything that is not on this list.  In addition, your doctor has ordered for you to drink the provided:  Ensure Pre-Surgery Clear Carbohydrate Drink   Drinking this carbohydrate drink up to two hours before surgery helps to reduce insulin resistance and improve patient outcomes. Please complete drinking 2 hours before scheduled arrival time.  One week prior to surgery: Stop Anti-inflammatories (NSAIDS) such as Advil, Aleve, Ibuprofen, Motrin, Naproxen, Naprosyn and Aspirin based products such as Excedrin, Goody's Powder, BC Powder. Stop ANY OVER THE COUNTER supplements until after surgery like ;calcium carbonate,cholecalciferol (VITAMIN D3), Homeopathic Products, magnesium oxide, Multiple Vitamin,pyridOXINE (VITAMIN B6), Vit B12.  You may however, continue to take Tylenol if needed for pain up until the day of surgery.   Continue taking all of your other prescription medications up until the  day of surgery.  ON THE DAY OF SURGERY ONLY TAKE THESE MEDICATIONS WITH SIPS OF WATER:  levothyroxine (SYNTHROID)    No Alcohol for 24 hours before or after surgery.  No Smoking including e-cigarettes for 24 hours before surgery.  No chewable tobacco products for at least 6 hours before surgery.  No nicotine patches on the day of surgery.  Do not use any "recreational" drugs for at least a week (preferably 2 weeks) before your surgery.  Please be advised that the combination of cocaine and anesthesia may have negative outcomes, up to and including death. If you test positive for cocaine, your surgery will be cancelled.  On the morning of surgery brush your teeth with toothpaste and water, you may rinse your mouth with mouthwash if you wish. Do not swallow any toothpaste or mouthwash.  Use CHG Soap or wipes as directed on instruction sheet.  Do not wear jewelry, make-up, hairpins, clips or nail polish.  For welded (permanent) jewelry: bracelets, anklets, waist bands, etc.  Please have this removed prior to surgery.  If it is not removed, there is a chance that hospital personnel will need to cut it off on the day of surgery.  Do not wear lotions, powders, or perfumes.   Do not shave body hair from the neck down 48 hours before surgery.  Contact lenses, hearing aids and dentures may not be worn into surgery.  Do not bring valuables to the hospital. [facility] is not responsible for any missing/lost belongings or valuables.    Notify your doctor if there is any change in your  medical condition (cold, fever, infection).  Wear comfortable clothing (specific to your surgery type) to the hospital.  After surgery, you can help prevent lung complications by doing breathing exercises.  Take deep breaths and cough every 1-2 hours. Your doctor may order a device called an Incentive Spirometer to help you take deep breaths.  If you are being admitted to the hospital overnight, leave  your suitcase in the car. After surgery it may be brought to your room.  In case of increased patient census, it may be necessary for you, the patient, to continue your postoperative care in the Same Day Surgery department.  If you are being discharged the day of surgery, you will not be allowed to drive home. You will need a responsible individual to drive you home and stay with you for 24 hours after surgery.     Please call the Pre-admissions Testing Dept. at [phone] if you have any questions about these instructions.  Surgery Visitation Policy:  Patients having surgery or a procedure may have two visitors.  Children under the age of 18 must have an adult with them who is not the patient.  Temporary Visitor Restrictions Due to increasing cases of flu, RSV and COVID-19: Children ages 41 and under will not be able to visit patients in [facility] hospitals under most circumstances.  Inpatient Visitation:    Visiting hours are 7 a.m. to 8 p.m. Up to four visitors are allowed at one time in a patient room. The visitors may rotate out with other people during the day.  One visitor age 42 or older may stay with the patient overnight and must be in the room by 8 p.m.    Pre-operative 5 CHG Bath Instructions   You can play a key role in reducing the risk of infection after surgery. Your skin needs to be as free of germs as possible. You can reduce the number of germs on your skin by washing with CHG (chlorhexidine gluconate) soap before surgery. CHG is an antiseptic soap that kills germs and continues to kill germs even after washing.   DO NOT use if you have an allergy to chlorhexidine/CHG or antibacterial soaps. If your skin becomes reddened or irritated, stop using the CHG and notify one of our RNs at [phone].   Please shower with the CHG soap starting 4 days before surgery using the following schedule:     Please keep in mind the following:  DO NOT shave, including  legs and underarms, starting the day of your first shower.   You may shave your face at any point before/day of surgery.  Place clean sheets on your bed the day you start using CHG soap. Use a clean washcloth (not used since being washed) for each shower. DO NOT sleep with pets once you start using the CHG.   CHG Shower Instructions:  If you choose to wash your hair and private area, wash first with your normal shampoo/soap.  After you use shampoo/soap, rinse your hair and body thoroughly to remove shampoo/soap residue.  Turn the water OFF and apply about 3 tablespoons (45 ml) of CHG soap to a CLEAN washcloth.  Apply CHG soap ONLY FROM YOUR NECK DOWN TO YOUR TOES (washing for 3-5 minutes)  DO NOT use CHG soap on face, private areas, open wounds, or sores.  Pay special attention to the area where your surgery is being performed.  If you are having back surgery, having someone wash your back for you  may be helpful. Wait 2 minutes after CHG soap is applied, then you may rinse off the CHG soap.  Pat dry with a clean towel  Put on clean clothes/pajamas   If you choose to wear lotion, please use ONLY the CHG-compatible lotions on the back of this paper.     Additional instructions for the day of surgery: DO NOT APPLY any lotions, deodorants, cologne, or perfumes.   Put on clean/comfortable clothes.  Brush your teeth.  Ask your nurse before applying any prescription medications to the skin.      CHG Compatible Lotions   Aveeno Moisturizing lotion  Cetaphil Moisturizing Cream  Cetaphil Moisturizing Lotion  Clairol Herbal Essence Moisturizing Lotion, Dry Skin  Clairol Herbal Essence Moisturizing Lotion, Extra Dry Skin  Clairol Herbal Essence Moisturizing Lotion, Normal Skin  Curel Age Defying Therapeutic Moisturizing Lotion with Alpha Hydroxy  Curel Extreme Care Body Lotion  Curel Soothing Hands Moisturizing Hand Lotion  Curel Therapeutic Moisturizing Cream, Fragrance-Free  Curel  Therapeutic Moisturizing Lotion, Fragrance-Free  Curel Therapeutic Moisturizing Lotion, Original Formula  Eucerin Daily Replenishing Lotion  Eucerin Dry Skin Therapy Plus Alpha Hydroxy Crme  Eucerin Dry Skin Therapy Plus Alpha Hydroxy Lotion  Eucerin Original Crme  Eucerin Original Lotion  Eucerin Plus Crme Eucerin Plus Lotion  Eucerin TriLipid Replenishing Lotion  Keri Anti-Bacterial Hand Lotion  Keri Deep Conditioning Original Lotion Dry Skin Formula Softly Scented  Keri Deep Conditioning Original Lotion, Fragrance Free Sensitive Skin Formula  Keri Lotion Fast Absorbing Fragrance Free Sensitive Skin Formula  Keri Lotion Fast Absorbing Softly Scented Dry Skin Formula  Keri Original Lotion  Keri Skin Renewal Lotion Keri Silky Smooth Lotion  Keri Silky Smooth Sensitive Skin Lotion  Nivea Body Creamy Conditioning Oil  Nivea Body Extra Enriched Lotion  Nivea Body Original Lotion  Nivea Body Sheer Moisturizing Lotion Nivea Crme  Nivea Skin Firming Lotion  NutraDerm 30 Skin Lotion  NutraDerm Skin Lotion  NutraDerm Therapeutic Skin Cream  NutraDerm Therapeutic Skin Lotion  ProShield Protective Hand Cream  Provon moisturizing lotion  Preoperative Educational Videos for Total Hip, Knee and Shoulder Replacements  To better prepare for surgery, please view our videos that explain the physical activity and discharge planning required to have the best surgical recovery at [facility].  [email or web address]  Questions? Call [phone] or email [email or web address]     How to Use an Incentive Spirometer  An incentive spirometer is a tool that measures how well you are filling your lungs with each breath. Learning to take long, deep breaths using this tool can help you keep your lungs clear and active. This may help to reverse or lessen your chance of developing breathing (pulmonary) problems,  especially infection. You may be asked to use a spirometer: After a surgery. If you have a lung problem or a history of smoking. After a long period of time when you have been unable to move or be active. If the spirometer includes an indicator to show the highest number that you have reached, your health care provider or respiratory therapist will help you set a goal. Keep a log of your progress as told by your health care provider. What are the risks? Breathing too quickly may cause dizziness or cause you to pass out. Take your time so you do not get dizzy or light-headed. If you are in pain, you may need to take pain medicine before doing incentive spirometry. It is harder to take a deep breath if you are having pain. How  to use your incentive spirometer  Sit up on the edge of your bed or on a chair. Hold the incentive spirometer so that it is in an upright position. Before you use the spirometer, breathe out normally. Place the mouthpiece in your mouth. Make sure your lips are closed tightly around it. Breathe in slowly and as deeply as you can through your mouth, causing the piston or the ball to rise toward the top of the chamber. Hold your breath for 3-5 seconds, or for as long as possible. If the spirometer includes a coach indicator, use this to guide you in breathing. Slow down your breathing if the indicator goes above the marked areas. Remove the mouthpiece from your mouth and breathe out normally. The piston or ball will return to the bottom of the chamber. Rest for a few seconds, then repeat the steps 10 or more times. Take your time and take a few normal breaths between deep breaths so that you do not get dizzy or light-headed. Do this every 1-2 hours when you are awake. If the spirometer includes a goal marker to show the highest number you have reached (best effort), use this as a goal to work toward during each repetition. After each set of 10 deep breaths, cough a few times.  This will help to make sure that your lungs are clear. If you have an incision on your chest or abdomen from surgery, place a pillow or a rolled-up towel firmly against the incision when you cough. This can help to reduce pain while taking deep breaths and coughing. General tips When you are able to get out of bed: Walk around often. Continue to take deep breaths and cough in order to clear your lungs. Keep using the incentive spirometer until your health care provider says it is okay to stop using it. If you have been in the hospital, you may be told to keep using the spirometer at home. Contact a health care provider if: You are having difficulty using the spirometer. You have trouble using the spirometer as often as instructed. Your pain medicine is not giving enough relief for you to use the spirometer as told. You have a fever. Get help right away if: You develop shortness of breath. You develop a cough with bloody mucus from the lungs. You have fluid or blood coming from an incision site after you cough. Summary An incentive spirometer is a tool that can help you learn to take long, deep breaths to keep your lungs clear and active. You may be asked to use a spirometer after a surgery, if you have a lung problem or a history of smoking, or if you have been inactive for a long period of time. Use your incentive spirometer as instructed every 1-2 hours while you are awake. If you have an incision on your chest or abdomen, place a pillow or a rolled-up towel firmly against your incision when you cough. This will help to reduce pain. Get help right away if you have shortness of breath, you cough up bloody mucus, or blood comes from your incision when you cough. This information is not intended to replace advice given to you by your health care provider. Make sure you discuss any questions you have with your health care provider. Document Revised: 02/28/2020 Document Reviewed:  02/28/2020 Elsevier Patient Education  2023 [employer].

## 2024-03-03 MED ORDER — TRANEXAMIC ACID-NACL 1000-0.7 MG/100ML-% IV SOLN
1000.0000 mg | INTRAVENOUS | Status: AC
  Administered 2024-03-04 (×2): 1000 mg via INTRAVENOUS

## 2024-03-03 MED ORDER — CEFAZOLIN SODIUM-DEXTROSE 2-4 GM/100ML-% IV SOLN
2.0000 g | INTRAVENOUS | Status: AC
  Administered 2024-03-04: 2 g via INTRAVENOUS

## 2024-03-03 MED ORDER — CHLORHEXIDINE GLUCONATE 0.12 % MT SOLN
15.0000 mL | Freq: Once | OROMUCOSAL | Status: AC
  Administered 2024-03-04: 15 mL via OROMUCOSAL

## 2024-03-03 MED ORDER — DEXAMETHASONE SODIUM PHOSPHATE 10 MG/ML IJ SOLN
8.0000 mg | Freq: Once | INTRAMUSCULAR | Status: AC
  Administered 2024-03-04: 8 mg via INTRAVENOUS

## 2024-03-03 MED ORDER — ORAL CARE MOUTH RINSE: 15.0000 mL | Freq: Once | OROMUCOSAL | Status: AC

## 2024-03-03 MED ORDER — LACTATED RINGERS IV SOLN
INTRAVENOUS | Status: DC
Start: 2024-03-03 — End: 2024-03-04

## 2024-03-04 ENCOUNTER — Ambulatory Visit: Payer: Self-pay | Admitting: Certified Registered"

## 2024-03-04 ENCOUNTER — Other Ambulatory Visit: Payer: Self-pay

## 2024-03-04 ENCOUNTER — Ambulatory Visit

## 2024-03-04 ENCOUNTER — Ambulatory Visit: Payer: Self-pay | Admitting: Urgent Care

## 2024-03-04 ENCOUNTER — Encounter: Payer: Self-pay | Admitting: Orthopedic Surgery

## 2024-03-04 ENCOUNTER — Encounter
Admission: AD | Disposition: A | Discharge status: Skilled Nursing Facility | Payer: Self-pay | Source: Home / Self Care | Attending: Orthopedic Surgery

## 2024-03-04 ENCOUNTER — Inpatient Hospital Stay
Admission: AD | Admit: 2024-03-04 | Discharge: 2024-03-09 | DRG: 470 | Disposition: A | Discharge status: Skilled Nursing Facility | Payer: Medicare Other | Attending: Orthopedic Surgery | Admitting: Orthopedic Surgery

## 2024-03-04 DIAGNOSIS — Z90710 Acquired absence of both cervix and uterus: Secondary | ICD-10-CM

## 2024-03-04 DIAGNOSIS — Z825 Family history of asthma and other chronic lower respiratory diseases: Secondary | ICD-10-CM

## 2024-03-04 DIAGNOSIS — Z9049 Acquired absence of other specified parts of digestive tract: Secondary | ICD-10-CM

## 2024-03-04 DIAGNOSIS — E669 Obesity, unspecified: Secondary | ICD-10-CM | POA: Diagnosis present

## 2024-03-04 DIAGNOSIS — Z96651 Presence of right artificial knee joint: Principal | ICD-10-CM

## 2024-03-04 DIAGNOSIS — Z833 Family history of diabetes mellitus: Secondary | ICD-10-CM

## 2024-03-04 DIAGNOSIS — E785 Hyperlipidemia, unspecified: Secondary | ICD-10-CM | POA: Diagnosis present

## 2024-03-04 DIAGNOSIS — M1711 Unilateral primary osteoarthritis, right knee: Principal | ICD-10-CM | POA: Diagnosis present

## 2024-03-04 DIAGNOSIS — G629 Polyneuropathy, unspecified: Secondary | ICD-10-CM | POA: Diagnosis present

## 2024-03-04 DIAGNOSIS — Z823 Family history of stroke: Secondary | ICD-10-CM

## 2024-03-04 DIAGNOSIS — Z96652 Presence of left artificial knee joint: Secondary | ICD-10-CM | POA: Diagnosis present

## 2024-03-04 DIAGNOSIS — Z79890 Hormone replacement therapy (postmenopausal): Secondary | ICD-10-CM

## 2024-03-04 DIAGNOSIS — E039 Hypothyroidism, unspecified: Secondary | ICD-10-CM | POA: Diagnosis present

## 2024-03-04 DIAGNOSIS — Z6834 Body mass index (BMI) 34.0-34.9, adult: Secondary | ICD-10-CM

## 2024-03-04 DIAGNOSIS — K219 Gastro-esophageal reflux disease without esophagitis: Secondary | ICD-10-CM | POA: Diagnosis present

## 2024-03-04 HISTORY — PX: TOTAL KNEE ARTHROPLASTY: SHX125

## 2024-03-04 SURGERY — ARTHROPLASTY, KNEE, TOTAL
Anesthesia: Spinal | Site: Knee | Laterality: Right

## 2024-03-04 MED ORDER — ACETAMINOPHEN 500 MG PO TABS
1000.0000 mg | ORAL_TABLET | Freq: Three times a day (TID) | ORAL | Status: AC
  Administered 2024-03-04: 1000 mg via ORAL
  Filled 2024-03-04 (×3): qty 2

## 2024-03-04 MED ORDER — SODIUM CHLORIDE 0.9 % IV SOLN: INTRAVENOUS | Status: DC

## 2024-03-04 MED ORDER — METOCLOPRAMIDE HCL 5 MG/ML IJ SOLN: 5.0000 mg | Freq: Three times a day (TID) | INTRAMUSCULAR | Status: DC | PRN

## 2024-03-04 MED ORDER — SODIUM CHLORIDE (PF) 0.9 % IJ SOLN
INTRAMUSCULAR | Status: AC
  Filled 2024-03-04: qty 20

## 2024-03-04 MED ORDER — KETOROLAC TROMETHAMINE 15 MG/ML IJ SOLN
7.5000 mg | Freq: Four times a day (QID) | INTRAMUSCULAR | Status: AC
  Administered 2024-03-04 – 2024-03-05 (×4): 7.5 mg via INTRAVENOUS
  Filled 2024-03-04 (×4): qty 1

## 2024-03-04 MED ORDER — ONDANSETRON HCL 4 MG/2ML IJ SOLN
INTRAMUSCULAR | Status: AC
  Filled 2024-03-04: qty 2

## 2024-03-04 MED ORDER — TRANEXAMIC ACID-NACL 1000-0.7 MG/100ML-% IV SOLN
INTRAVENOUS | Status: AC
  Filled 2024-03-04: qty 100

## 2024-03-04 MED ORDER — MAGNESIUM OXIDE -MG SUPPLEMENT 400 (240 MG) MG PO TABS
400.0000 mg | ORAL_TABLET | Freq: Every day | ORAL | Status: DC
  Administered 2024-03-04 – 2024-03-09 (×6): 400 mg via ORAL
  Filled 2024-03-04 (×6): qty 1

## 2024-03-04 MED ORDER — MUPIROCIN 2 % EX OINT: 1.0000 | TOPICAL_OINTMENT | Freq: Two times a day (BID) | CUTANEOUS | 0 refills | Status: AC

## 2024-03-04 MED ORDER — SODIUM CHLORIDE 0.9 % IR SOLN
Status: DC | PRN
  Administered 2024-03-04: 3000 mL

## 2024-03-04 MED ORDER — MIDAZOLAM HCL 5 MG/5ML IJ SOLN
INTRAMUSCULAR | Status: DC | PRN
  Administered 2024-03-04 (×2): 1 mg via INTRAVENOUS

## 2024-03-04 MED ORDER — LIDOCAINE HCL (PF) 2 % IJ SOLN
INTRAMUSCULAR | Status: AC
  Filled 2024-03-04: qty 5

## 2024-03-04 MED ORDER — ONDANSETRON HCL 4 MG/2ML IJ SOLN: 4.0000 mg | Freq: Four times a day (QID) | INTRAMUSCULAR | Status: DC | PRN

## 2024-03-04 MED ORDER — HYDROCODONE-ACETAMINOPHEN 5-325 MG PO TABS
1.0000 | ORAL_TABLET | ORAL | Status: DC | PRN
  Administered 2024-03-04 – 2024-03-09 (×15): 2 via ORAL
  Filled 2024-03-04 (×15): qty 2

## 2024-03-04 MED ORDER — CYCLOSPORINE 0.05 % OP EMUL
1.0000 [drp] | Freq: Two times a day (BID) | OPHTHALMIC | Status: DC
  Administered 2024-03-05 – 2024-03-09 (×9): 1 [drp] via OPHTHALMIC
  Filled 2024-03-04 (×10): qty 30

## 2024-03-04 MED ORDER — PHENYLEPHRINE HCL-NACL 20-0.9 MG/250ML-% IV SOLN
INTRAVENOUS | Status: DC | PRN
  Administered 2024-03-04: 80 ug/min via INTRAVENOUS

## 2024-03-04 MED ORDER — ENOXAPARIN SODIUM 30 MG/0.3ML IJ SOSY
30.0000 mg | PREFILLED_SYRINGE | Freq: Two times a day (BID) | INTRAMUSCULAR | Status: DC
  Administered 2024-03-05 – 2024-03-09 (×9): 30 mg via SUBCUTANEOUS
  Filled 2024-03-04 (×10): qty 0.3

## 2024-03-04 MED ORDER — SODIUM CHLORIDE (PF) 0.9 % IJ SOLN
INTRAMUSCULAR | Status: DC | PRN
  Administered 2024-03-04: 70 mL

## 2024-03-04 MED ORDER — LEVOTHYROXINE SODIUM 50 MCG PO TABS
100.0000 ug | ORAL_TABLET | Freq: Every day | ORAL | Status: DC
Start: 2024-03-05 — End: 2024-03-09
  Administered 2024-03-05 – 2024-03-09 (×5): 100 ug via ORAL
  Filled 2024-03-04 (×5): qty 2

## 2024-03-04 MED ORDER — EPHEDRINE SULFATE-NACL 50-0.9 MG/10ML-% IV SOSY
PREFILLED_SYRINGE | INTRAVENOUS | Status: DC | PRN
  Administered 2024-03-04: 5 mg via INTRAVENOUS

## 2024-03-04 MED ORDER — ACETAMINOPHEN 325 MG PO TABS
325.0000 mg | ORAL_TABLET | Freq: Four times a day (QID) | ORAL | Status: DC | PRN
  Filled 2024-03-04: qty 1

## 2024-03-04 MED ORDER — CHLORHEXIDINE GLUCONATE 4 % EX SOLN: 1.0000 | CUTANEOUS | 1 refills | Status: AC

## 2024-03-04 MED ORDER — CEFAZOLIN SODIUM-DEXTROSE 2-4 GM/100ML-% IV SOLN
2.0000 g | Freq: Four times a day (QID) | INTRAVENOUS | Status: AC
  Administered 2024-03-04 – 2024-03-05 (×2): 2 g via INTRAVENOUS
  Filled 2024-03-04 (×2): qty 100

## 2024-03-04 MED ORDER — BUPIVACAINE-EPINEPHRINE (PF) 0.25% -1:200000 IJ SOLN
INTRAMUSCULAR | Status: AC
  Filled 2024-03-04: qty 30

## 2024-03-04 MED ORDER — BUPIVACAINE HCL (PF) 0.5 % IJ SOLN
INTRAMUSCULAR | Status: AC
  Filled 2024-03-04: qty 10

## 2024-03-04 MED ORDER — PHENYLEPHRINE HCL (PRESSORS) 10 MG/ML IV SOLN
INTRAVENOUS | Status: AC
  Filled 2024-03-04: qty 1

## 2024-03-04 MED ORDER — MENTHOL 3 MG MT LOZG
1.0000 | LOZENGE | OROMUCOSAL | Status: DC | PRN
  Filled 2024-03-04: qty 9

## 2024-03-04 MED ORDER — CHLORHEXIDINE GLUCONATE 0.12 % MT SOLN
OROMUCOSAL | Status: AC
Start: 2024-03-04 — End: ?
  Filled 2024-03-04: qty 15

## 2024-03-04 MED ORDER — BUPIVACAINE LIPOSOME 1.3 % IJ SUSP
INTRAMUSCULAR | Status: AC
  Filled 2024-03-04: qty 20

## 2024-03-04 MED ORDER — CEFAZOLIN SODIUM-DEXTROSE 2-4 GM/100ML-% IV SOLN
INTRAVENOUS | Status: AC
  Filled 2024-03-04: qty 100

## 2024-03-04 MED ORDER — MORPHINE SULFATE (PF) 2 MG/ML IV SOLN
0.5000 mg | INTRAVENOUS | Status: DC | PRN
Start: 2024-03-04 — End: 2024-03-09
  Administered 2024-03-04: 0.5 mg via INTRAVENOUS
  Administered 2024-03-05 – 2024-03-09 (×5): 1 mg via INTRAVENOUS
  Filled 2024-03-04 (×5): qty 1

## 2024-03-04 MED ORDER — SURGIPHOR WOUND IRRIGATION SYSTEM - OPTIME: TOPICAL | Status: DC | PRN

## 2024-03-04 MED ORDER — DEXAMETHASONE SODIUM PHOSPHATE 10 MG/ML IJ SOLN
INTRAMUSCULAR | Status: AC
  Filled 2024-03-04: qty 1

## 2024-03-04 MED ORDER — METOCLOPRAMIDE HCL 5 MG PO TABS: 5.0000 mg | ORAL_TABLET | Freq: Three times a day (TID) | ORAL | Status: DC | PRN

## 2024-03-04 MED ORDER — LACTATED RINGERS IV SOLN: INTRAVENOUS | Status: DC

## 2024-03-04 MED ORDER — EPHEDRINE 5 MG/ML INJ
INTRAVENOUS | Status: AC
Start: 2024-03-04 — End: ?
  Filled 2024-03-04: qty 5

## 2024-03-04 MED ORDER — PHENOL 1.4 % MT LIQD
1.0000 | OROMUCOSAL | Status: DC | PRN
  Filled 2024-03-04: qty 177

## 2024-03-04 MED ORDER — CYCLOBENZAPRINE HCL 10 MG PO TABS
10.0000 mg | ORAL_TABLET | Freq: Every day | ORAL | Status: DC
  Administered 2024-03-04 – 2024-03-08 (×5): 10 mg via ORAL
  Filled 2024-03-04 (×5): qty 1

## 2024-03-04 MED ORDER — APOAEQUORIN 20 MG PO CAPS: 20.0000 mg | ORAL_CAPSULE | Freq: Every day | ORAL | Status: DC

## 2024-03-04 MED ORDER — ONDANSETRON HCL 4 MG PO TABS: 4.0000 mg | ORAL_TABLET | Freq: Four times a day (QID) | ORAL | Status: DC | PRN

## 2024-03-04 MED ORDER — BUPIVACAINE HCL (PF) 0.5 % IJ SOLN
INTRAMUSCULAR | Status: DC | PRN
Start: 2024-03-04 — End: 2024-03-04
  Administered 2024-03-04: 2.4 mL via INTRATHECAL

## 2024-03-04 MED ORDER — ACETAMINOPHEN 10 MG/ML IV SOLN
INTRAVENOUS | Status: DC | PRN
  Administered 2024-03-04: 1000 mg via INTRAVENOUS

## 2024-03-04 MED ORDER — ONDANSETRON HCL 4 MG/2ML IJ SOLN
INTRAMUSCULAR | Status: DC | PRN
  Administered 2024-03-04: 4 mg via INTRAVENOUS

## 2024-03-04 MED ORDER — ONDANSETRON HCL 4 MG/2ML IJ SOLN: 4.0000 mg | Freq: Once | INTRAMUSCULAR | Status: DC | PRN

## 2024-03-04 MED ORDER — MIDAZOLAM HCL 2 MG/2ML IJ SOLN
INTRAMUSCULAR | Status: AC
  Filled 2024-03-04: qty 2

## 2024-03-04 MED ORDER — PANTOPRAZOLE SODIUM 40 MG PO TBEC
40.0000 mg | DELAYED_RELEASE_TABLET | Freq: Every day | ORAL | Status: DC
  Administered 2024-03-04 – 2024-03-09 (×6): 40 mg via ORAL
  Filled 2024-03-04 (×6): qty 1

## 2024-03-04 MED ORDER — ACETAMINOPHEN 10 MG/ML IV SOLN
INTRAVENOUS | Status: AC
  Filled 2024-03-04: qty 100

## 2024-03-04 MED ORDER — DOCUSATE SODIUM 100 MG PO CAPS
100.0000 mg | ORAL_CAPSULE | Freq: Two times a day (BID) | ORAL | Status: DC
  Administered 2024-03-04 – 2024-03-09 (×8): 100 mg via ORAL
  Filled 2024-03-04 (×9): qty 1

## 2024-03-04 MED ORDER — PROPOFOL 500 MG/50ML IV EMUL
INTRAVENOUS | Status: DC | PRN
  Administered 2024-03-04: 25 ug/kg/min via INTRAVENOUS

## 2024-03-04 MED ORDER — PROPOFOL 1000 MG/100ML IV EMUL
INTRAVENOUS | Status: AC
  Filled 2024-03-04: qty 100

## 2024-03-04 MED ORDER — GABAPENTIN 300 MG PO CAPS
600.0000 mg | ORAL_CAPSULE | Freq: Every day | ORAL | Status: DC
  Administered 2024-03-04 – 2024-03-08 (×5): 600 mg via ORAL
  Filled 2024-03-04 (×5): qty 2

## 2024-03-04 MED ORDER — TRAMADOL HCL 50 MG PO TABS
50.0000 mg | ORAL_TABLET | Freq: Four times a day (QID) | ORAL | Status: DC | PRN
  Administered 2024-03-05 – 2024-03-09 (×7): 50 mg via ORAL
  Filled 2024-03-04 (×7): qty 1

## 2024-03-04 MED ORDER — FENTANYL CITRATE (PF) 100 MCG/2ML IJ SOLN: 25.0000 ug | INTRAMUSCULAR | Status: DC | PRN

## 2024-03-04 MED ORDER — ACETAMINOPHEN 10 MG/ML IV SOLN: 1000.0000 mg | Freq: Once | INTRAVENOUS | Status: DC | PRN

## 2024-03-04 SURGICAL SUPPLY — 68 items
BLADE PATELLA REAM PILOT HOLE (MISCELLANEOUS) IMPLANT
BLADE SAGITTAL AGGR TOOTH XLG (BLADE) IMPLANT
BLADE SAW SAG 25X90X1.19 (BLADE) ×1 IMPLANT
BLADE SAW SAG 29X58X.64 (BLADE) ×1 IMPLANT
BNDG ELASTIC 6INX 5YD STR LF (GAUZE/BANDAGES/DRESSINGS) ×1 IMPLANT
BOWL CEMENT MIX W/ADAPTER (MISCELLANEOUS) ×1 IMPLANT
BRUSH SCRUB EZ PLAIN DRY (MISCELLANEOUS) ×1 IMPLANT
CEMENT BONE R 1X40 (Cement) ×2 IMPLANT
CHLORAPREP W/TINT 26 (MISCELLANEOUS) ×2 IMPLANT
COMP FEM CMT PERSONA SZ7 RT (Joint) ×1 IMPLANT
COMPONENT FEM CMT PRSONA SZ7RT (Joint) IMPLANT
COOLER POLAR GLACIER W/PUMP (MISCELLANEOUS) ×1 IMPLANT
CUFF TRNQT CYL 24X4X16.5-23 (TOURNIQUET CUFF) IMPLANT
CUFF TRNQT CYL 30X4X21-28X (TOURNIQUET CUFF) IMPLANT
DERMABOND ADVANCED .7 DNX12 (GAUZE/BANDAGES/DRESSINGS) ×1 IMPLANT
DRAPE SHEET LG 3/4 BI-LAMINATE (DRAPES) ×2 IMPLANT
DRSG MEPILEX SACRM 8.7X9.8 (GAUZE/BANDAGES/DRESSINGS) ×1 IMPLANT
DRSG OPSITE POSTOP 4X10 (GAUZE/BANDAGES/DRESSINGS) IMPLANT
DRSG OPSITE POSTOP 4X8 (GAUZE/BANDAGES/DRESSINGS) IMPLANT
ELECT REM PT RETURN 9FT ADLT (ELECTROSURGICAL) ×1 IMPLANT
ELECTRODE REM PT RTRN 9FT ADLT (ELECTROSURGICAL) ×1 IMPLANT
GLOVE BIO SURGEON STRL SZ8 (GLOVE) ×1 IMPLANT
GLOVE BIOGEL PI IND STRL 8 (GLOVE) ×1 IMPLANT
GLOVE PI ORTHO PRO STRL 7.5 (GLOVE) ×2 IMPLANT
GLOVE PI ORTHO PRO STRL SZ8 (GLOVE) ×2 IMPLANT
GLOVE SURG SYN 7.5 E (GLOVE) ×1 IMPLANT
GLOVE SURG SYN 7.5 PF PI (GLOVE) ×1 IMPLANT
GOWN SRG XL LVL 3 NONREINFORCE (GOWNS) ×1 IMPLANT
GOWN STRL REUS W/ TWL LRG LVL3 (GOWN DISPOSABLE) ×1 IMPLANT
GOWN STRL REUS W/ TWL XL LVL3 (GOWN DISPOSABLE) ×1 IMPLANT
HOOD PEEL AWAY T7 (MISCELLANEOUS) ×2 IMPLANT
KIT TURNOVER KIT A (KITS) ×1 IMPLANT
LINER ASF PERS 10X6/7 CD RT (Liner) IMPLANT
MANIFOLD NEPTUNE II (INSTRUMENTS) ×1 IMPLANT
MARKER SKIN DUAL TIP RULER LAB (MISCELLANEOUS) ×1 IMPLANT
MAT ABSORB FLUID 56X50 GRAY (MISCELLANEOUS) ×1 IMPLANT
NDL HYPO 21X1.5 SAFETY (NEEDLE) ×1 IMPLANT
NEEDLE HYPO 21X1.5 SAFETY (NEEDLE) ×1 IMPLANT
PACK TOTAL KNEE (MISCELLANEOUS) ×1 IMPLANT
PAD ARMBOARD 7.5X6 YLW CONV (MISCELLANEOUS) ×3 IMPLANT
PAD WRAPON POLAR KNEE (MISCELLANEOUS) ×1 IMPLANT
PENCIL SMOKE EVACUATOR (MISCELLANEOUS) ×1 IMPLANT
PIN DRILL HDLS TROCAR 75 4PK (PIN) IMPLANT
PULSAVAC PLUS IRRIG FAN TIP (DISPOSABLE) ×1 IMPLANT
SCREW FEMALE HEX FIX 25X2.5 (ORTHOPEDIC DISPOSABLE SUPPLIES) IMPLANT
SCREW HEX HEADED 3.5X27 DISP (ORTHOPEDIC DISPOSABLE SUPPLIES) IMPLANT
SLEEVE SCD COMPRESS KNEE MED (STOCKING) ×1 IMPLANT
SOL .9 NS 3000ML IRR UROMATIC (IV SOLUTION) ×1 IMPLANT
SOLUTION IRRIG SURGIPHOR (IV SOLUTION) ×1 IMPLANT
STEM POLY PAT PLY 29M KNEE (Knees) IMPLANT
STEM TIB ST PERS 14+30 (Stem) IMPLANT
STEM TIBIA 5 DEG SZ D R KNEE (Knees) IMPLANT
SUT STRATA 1 CT-1 DLB (SUTURE) ×1 IMPLANT
SUT STRATAFIX 14 PDO 48 VLT (SUTURE) ×1 IMPLANT
SUT STRATAFIX PDO 1 14 VIOLET (SUTURE) ×1 IMPLANT
SUT VIC AB 0 CT1 36 (SUTURE) ×1 IMPLANT
SUT VIC AB 2-0 CT2 27 (SUTURE) ×2 IMPLANT
SUT VICRYL 1-0 27IN ABS (SUTURE) ×1 IMPLANT
SUTURE STRATA SPIR 4-0 18 (SUTURE) ×1 IMPLANT
SUTURE VICRYL 1-0 27IN ABS (SUTURE) ×1 IMPLANT
SYR 20ML LL LF (SYRINGE) ×2 IMPLANT
TAPE CLOTH 3X10 WHT NS LF (GAUZE/BANDAGES/DRESSINGS) ×1 IMPLANT
TIBIA STEM 5 DEG SZ D R KNEE (Knees) ×1 IMPLANT
TIP FAN IRRIG PULSAVAC PLUS (DISPOSABLE) ×1 IMPLANT
TOWEL OR 17X26 4PK STRL BLUE (TOWEL DISPOSABLE) IMPLANT
TRAP FLUID SMOKE EVACUATOR (MISCELLANEOUS) ×1 IMPLANT
WATER STERILE IRR 1000ML POUR (IV SOLUTION) ×1 IMPLANT
WRAPON POLAR PAD KNEE (MISCELLANEOUS) ×1 IMPLANT

## 2024-03-04 NOTE — Anesthesia Procedure Notes (Signed)
 Spinal  Patient location during procedure: OR Start time: 03/04/2024 1:29 PM End time: 03/04/2024 1:36 PM Reason for block: surgical anesthesia Staffing Performed: resident/CRNA  Resident/CRNA: Morene Crocker, CRNA Performed by: Morene Crocker, CRNA Authorized by: Reed Breech, MD   Preanesthetic Checklist Completed: patient identified, IV checked, site marked, risks and benefits discussed, surgical consent, monitors and equipment checked, pre-op evaluation and timeout performed Spinal Block Patient position: sitting Prep: ChloraPrep Patient monitoring: heart rate, continuous pulse ox and blood pressure Approach: midline Location: L4-5 Injection technique: single-shot Needle Needle type: Introducer and Pencan  Needle gauge: 24 G Needle length: 10 cm Assessment Events: CSF return Additional Notes Negative paresthesia. Negative blood return. Positive free-flowing CSF. Expiration date of kit checked and confirmed. Patient tolerated procedure well, without complications. Successful on first attempt, no complications noted. Pt. Tolerated procedure very well. No complaints of pain/discomfort noted.

## 2024-03-04 NOTE — Plan of Care (Signed)

## 2024-03-04 NOTE — Interval H&P Note (Signed)
Patient history and physical updated. Consent reviewed including risks, benefits, and alternatives to surgery. Patient agrees with above plan to proceed with right total knee arthroplasty  

## 2024-03-04 NOTE — Op Note (Signed)
 Patient Name: Jodi [name]  ZOX:[id number]  Pre-Operative Diagnosis: Right knee Osteoarthritis  Post-Operative Diagnosis: (same)  Procedure: Right Total Knee Arthroplasty  Components/Implants: Femur: Persona Size 7 CR   Tibia: Persona Size D w/ 14x13mm stem extension  Poly: 10mm MC  Patella: 29x41mm symmetric  Femoral Valgus Cut Angle: 5 degrees  Distal Femoral Re-cut: none  Patella Resurfacing: yes   Date of Surgery: 03/04/2024  Surgeon: Reinaldo Berber MD  Assistant: Amador Cunas PA (present and scrubbed throughout the case, critical for assistance with exposure, retraction, instrumentation, and closure), [place] PAs   Anesthesiologist: Mazzoni  Anesthesia: Spinal   Tourniquet Time: 58 min  EBL: 25cc  IVF: 400cc  Complications: None   Brief history: The patient is a 83 year old female with a history of osteoarthritis of the right knee with pain limiting their range of motion and activities of daily living, which has failed multiple attempts at conservative therapy.  The risks and benefits of total knee arthroplasty as definitive surgical treatment were discussed with the patient, who opted to proceed with the operation.  After outpatient medical clearance and optimization was completed the patient was admitted to [facility] for the procedure.  All preoperative films were reviewed and an appropriate surgical plan was made prior to surgery. Preoperative range of motion was 15 to 115 with a 15 degree flexion contracture.   Description of procedure: The patient was brought to the operating room where laterality was confirmed by all those present to be the right side.   Spinal anesthesia was administered and the patient received an intravenous dose of antibiotics for surgical prophylaxis and a dose of tranexamic acid.  Patient is positioned supine on the operating room table with all bony prominences well-padded.  A well-padded tourniquet was applied to  the right thigh.  The knee was then prepped and draped in usual sterile fashion with multiple layers of adhesive and nonadhesive drapes.  All of those present in the operating room participated in a surgical timeout laterality and patient were confirmed.   An Esmarch was wrapped around the extremity and the leg was elevated and the knee flexed.  The tourniquet was inflated to a pressure of 250 mmHg. The Esmarch was removed and the leg was brought down to full extension.  The patella and tibial tubercle identified and outlined using a marking pen and a midline skin incision was made with a knife carried through the subcutaneous tissue down to the extensor retinaculum.  After exposure of the extensor mechanism the medial parapatellar arthrotomy was performed with a scalpel and electrocautery extending down medial and distal to the tibial tubercle taking care to avoid incising the patellar tendon.   A standard medial release was performed over the proximal tibia.  The knee was brought into extension in order to excise the fat pad taking care not to damage the patella tendon.  The superior soft tissue was removed from the anterior surface of the distal femur to visualize for the procedure.  The knee was then brought into flexion with the patella subluxed laterally and subluxing the tibia anteriorly.  The ACL was transected and removed with electrocautery and additional soft tissue was removed from the proximal surface of the tibia to fully expose. The PCL was found to be intact and was preserved.  An extramedullary tibial cutting guide was then applied to the leg with a spring-loaded ankle clamp placed around the distal tibia just above the malleoli the angulation of the guide was adjusted to give  some posterior slope in the tibial resection with an appropriate varus/valgus alignment.  The resection guide was then pinned to the proximal tibia and the proximal tibial surface was resected with an oscillating saw.   Careful attention was paid to ensure the blade did not disrupt any of the soft tissues including any lateral or medial ligament.  Attention was then turned to the femur, with the knee slightly flexed a opening drill was used to enter the medullary canal of the femur.  After removing the drill marrow was suctioned out to decompress the distal femur.  An intramedullary femoral guide was then inserted into the drill hole and the alignment guide was seated firmly against the distal end of the medial femoral condyle.  The distal femoral cutting guide was then attached and pinned securely to the anterior surface of the femur and the intramedullary rod and alignment guide was removed.  Distal femur resection was then performed with an oscillating saw with retractors protecting medial and laterally.   The distal cutting block was then removed and the extension gap was checked with a spacer.  Extension gap was found to be appropriately sized to accommodate the spacer block.   The femoral sizing guide was then placed securely into the posterior condyles of the femur and the femoral size was measured and determined to be 7.  The size 7; 4-in-1 cutting guide was placed in position and secured with 2 pins.  The anterior posterior and chamfer resections were then performed with an oscillating saw.  Bony fragments and osteophytes were then removed.  Using a lamina spreader the posterior medial and lateral condyles were checked for additional osteophytes and posterior soft tissue remnants.  Any remaining meniscus was removed at this time.  Periarticular injection was performed in the meniscal rims and posterior capsule with aspiration performed to ensure no intravascular injection.   The tibia was then exposed and the tibial trial was pinned onto the plateau after confirming appropriate orientation and rotation.  Using the drill bushing the tibia was prepared to the appropriate drill depth.  Tibial broach impactor was then  driven through the punch guide using a mallet.  The femoral trial component was then inserted onto the femur.  A trial tibial polyethylene bearing was then placed and the knee was reduced.  The knee achieved full extension with no hyperextension and was found to be balanced in flexion and extension with the trials in place.  The knee was then brought into full extension the patella was everted and held with 2 Kocher clamps.  The articular surface of the patella was then resected with an patella reamer and saw after careful measurement with a caliper.  The patella was then prepared with the drill guide and a trial patella was placed.  The knee was then taken through range of motion and it was found that the patella articulated appropriately with the trochlea and good patellofemoral motion without subluxation.    The correct final components for implantation were confirmed and opened by the circulator nurse.  A bone plug was fashioned from the patient's bone cuts to plug the intramedullary femoral canal access hole and was tamped into place.  The prepared surfaces of the patella femur and tibia were cleaned with pulsatile lavage to remove all blood fat and other material and then the surfaces were dried.  2 bags of cement were mixed under vacuum and the components were cemented into place.  Excess cement was removed with curettes and forceps. A  trial polyethylene tibial component was placed and the knee was brought into extension to allow the cement to set.  At this time the periarticular injection cocktail was placed in the soft tissues surrounding the knee.  After full curing of the cement the balance of the knee was checked again and the final polyethylene size was confirmed. The tibial component was irrigated and locking mechanism checked to ensure it was clear of debris. The real polyethylene tibial component was implanted and the knee was brought through a range of motion.   The knee was then irrigated with  copious amount of normal saline via pulsatile lavage to remove all loose bodies and other debris.  The knee was then irrigated with surgiphor betadine based wash and reirrigated with saline.  The tourniquet was then dropped and all bleeding vessels were identified and coagulated.  The arthrotomy was approximated with #1 Vicryl and closed with #1 Stratafix suture.  The knee was brought into slight flexion and the subcutaneous tissues were closed with 0 Vicryl, 2-0 Vicryl and a running subcuticular 4-0 stratafix barbed suture.  Skin was then glued with Dermabond.  A sterile adhesive dressing was then placed along with a sequential compression device to the calf, a Ted stocking, and a cryotherapy cuff.   Sponge, needle, and Lap counts were all correct at the end of the case.   The patient was transferred off of the operating room table to a hospital bed, good pulses were found distally on the operative side.  The patient was transferred to the recovery room in stable condition.

## 2024-03-04 NOTE — Transfer of Care (Signed)
 Immediate Anesthesia Transfer of Care Note  Patient: Waylan Rocher  Procedure(s) Performed: ARTHROPLASTY, KNEE, TOTAL (Right: Knee)  Patient Location: PACU  Anesthesia Type:Spinal  Level of Consciousness: awake, alert , and oriented  Airway & Oxygen Therapy: Patient Spontanous Breathing  Post-op Assessment: Report given to RN and Post -op Vital signs reviewed and stable  Post vital signs: Reviewed and stable  Last Vitals:  Vitals Value Taken Time  BP 116/63 03/04/24 1545  Temp 36.2 1545  Pulse 77 03/04/24 1546  Resp 17 03/04/24 1546  SpO2 93 % 03/04/24 1546  Vitals shown include unfiled device data.  Last Pain:  Vitals:   03/04/24 1150  TempSrc: Temporal  PainSc: 2       Patients Stated Pain Goal: 0 (03/04/24 1150)  Complications: No notable events documented.

## 2024-03-04 NOTE — H&P (Signed)
 [name]: The patient is an 83 y.o. female seen in clinic today for history and physical for right total knee arthroplasty with Dr. Audelia Acton on 03/04/2024. Patient has x-ray and MRI findings showing significant osteoarthritis throughout the right knee along with meniscal tears. She has had years of increasing pain that is debilitating and interfering with her quality of life and activities daily living. She has had conservative treatment consisting of medications and injections with no improvement. She is seen Dr. Audelia Acton discussed total knee arthroplasty and agreed and consented for the procedure.  Patient is a non-smoker, nondiabetic with a BMI of 34.7 and denies any history of metal allergy and has not had any reactions to her existing knee replacement. I reviewed all previous medical and surgical histories as well as other office notes.  Past Medical History: Past Medical History:  Diagnosis Date  Arthritis  Benign neoplasm of colon  History of chicken pox  Hyperlipidemia  Other symptoms involving digestive system(787.99)  Peripheral neuropathy   Past Surgical History: Past Surgical History:  Procedure Laterality Date  APPENDECTOMY 1973  HYSTERECTOMY TOTAL ABDOMINAL W/REMOVAL TUBES &/OR OVARIES 1987  Secondary to precancerous lesion.  lower back surgery 1987  PHOTOREFRACTIVE KERATOTOMY/LASIK 1995  neck surgery 1999  PLANTAR FASCIECTOMY Left 11/1999  KNEE ARTHROSCOPY 01/2006  Left knee  CHOLECYSTECTOMY 08/2006  CATARACT EXTRACTION 2012  Bilateral  ARTHROPLASTY TOTAL KNEE Left 07/04/2022  By Dr. Rosita Kea  Dequervains Release Right 12/03/2022  By Dr. Rosita Kea  EGD @ [facility] 05/05/2023  Normal EGD biopsies/Gastritis/No repeat/SMR  cervical spine ACDF  x2  COLONOSCOPY 11/10/08; 02/25/13  Diverticulosis, internal hemorrhoids  INCISION TENDON SHEATH FOR TRIGGER FINGER  REDUCTION OVERCORRECTION PTOSIS   Past Family History: Family History  Problem Relation Age of Onset   Asthma Mother  Stroke Maternal Grandmother  Diabetes type II Maternal Grandfather   Medications: Current Outpatient Medications  Medication Sig Dispense Refill  acetaminophen (TYLENOL) 650 MG ER tablet Take 650 mg by mouth every 8 (eight) hours as needed for Pain.  apoaequorin (PREVAGEN) capsule Take 1 capsule by mouth once daily  calcium carbonate (TUMS) 200 mg calcium (500 mg) chewable tablet Take 2 tablets by mouth as needed  cyanocobalamin (VITAMIN B12) 1000 MCG tablet Take 1,000 mcg by mouth once daily  cyclobenzaprine (FLEXERIL) 10 MG tablet Take 1 tablet (10 mg total) by mouth 2 (two) times daily as needed for Muscle spasms 60 tablet 1  cyclobenzaprine (FLEXERIL) 10 MG tablet Take 10 mg by mouth at bedtime  docusate (COLACE) 100 MG capsule Take 100 mg by mouth 2 (two) times daily  esomeprazole (NEXIUM) 40 MG DR capsule Take 1 capsule (40 mg total) by mouth once daily 90 capsule 3  gabapentin (NEURONTIN) 300 MG capsule TAKE FOUR CAPSULES EACH NIGHT 360 capsule 1  gabapentin (NEURONTIN) 300 MG capsule Take 600 mg by mouth 2 (two) times daily  GLUCOSAM & CHONDROIT-MV & MIN3 (GLUCOSAMINE &CHONDROIT-MV-MIN3 ORAL) Take by mouth.  L. acidophilus/Bifid. animalis 32 billion cell Cap Take 1 capsule by mouth once daily  levothyroxine (SYNTHROID) 100 MCG tablet Take 1 tablet (100 mcg total) by mouth once daily Take on an empty stomach with a glass of water at least 30-60 minutes before breakfast. 90 tablet 3  levothyroxine (SYNTHROID) 100 MCG tablet Take 100 mcg by mouth every morning before breakfast (0630)  magnesium oxide,aspartate,citr 400 mg magnesium Cap Take by mouth  MULTIVITAMIN ORAL Take 1 tablet by mouth once daily.  potassium 99 mg Tab Take by mouth  RESTASIS 0.05 % ophthalmic emulsion  sennosides-docusate (SENOKOT-S) 8.6-50 mg tablet Take 2 tablets by mouth once daily  simethicone 250 mg Cap Take 250 mg by mouth once daily  traMADol-acetaminophen (ULTRACET) 37.5-325 mg tablet  Take 1 tablet by mouth every 8 (eight) hours as needed for Pain 20 tablet 0   No current facility-administered medications for this visit.   Allergies: Allergies  Allergen Reactions  Oxycodone-Acetaminophen Itching  If taken more than a few days in a row REACTION: Itching  Pregabalin Other (See Comments)  Double vision    Visit Vitals: Vitals:  02/25/24 1414  BP: 128/74    Review of Systems:  A comprehensive 14 point ROS was performed, reviewed, and the pertinent orthopaedic findings are documented in the HPI.  Physical Exam: General:  Well developed, well nourished, no apparent distress, normal affect, antalgic gait with a walker  HEENT: Head normocephalic, atraumatic, PERRL.   Abdomen: Soft, non tender, non distended, Bowel sounds present.  Heart: Examination of the heart reveals regular, rate, and rhythm. There is no murmur noted on ascultation. There is a normal apical pulse.  Lungs: Lungs are clear to auscultation. There is no wheeze, rhonchi, or crackles. There is normal expansion of bilateral chest walls.   Comprehensive Knee Exam: Gait Gait not assessed on today's visit  Alignment Neutral   Inspection Right  Skin Normal appearance with no obvious deformity. No ecchymosis or erythema. Skin fairly thin.  Soft Tissue No focal soft tissue swelling  Quad Atrophy None   Palpation  Right  Tenderness Lateral joint line and medial joint line tenderness to palpation  Crepitus + patellofemoral crepitus  Effusion None   Range of Motion Right  Flexion 15-115  Extension 15 degree flexion contracture not correctable   Ligamentous Exam Right  Lachman Normal  Valgus 0 Normal  Valgus 30 Normal  Varus 0 Normal  Varus 30 Normal  Anterior Drawer Normal  Posterior Drawer Normal   Meniscal Exam Right  Hyperflexion Test Positive  Hyperextension Test Positive  McMurray's Positive   Neurovascular Right  Quadriceps Strength 5/5  Hamstring Strength 5/5   Hip Abductor Strength 4/5  Distal Motor Normal  Distal Sensory Normal light touch sensation  Distal Pulses Normal    Imaging Studies: I reviewed AP, oblique, and lateral x-rays of the right knee performed on 10/27/2023 images and report reviewed by myself. There is medial and lateral joint space narrowing with tricompartmental degenerative changes with sclerosis and chondrocalcinosis as well as osteophyte formation. Kellgren-Lawrence grade 3 on these nonweightbearing films.  MRI of the right knee without contrast performed on 12/18/2023 images and report reviewed by myself. There is some signal within the medial meniscus in the posterior horn no clear tear. There is a complex tear of the lateral meniscus with root detachment partially anteriorly. There is also a parameniscal cyst noted. There is mild thinning of the medial compartment with some fibrillation. There is high-grade chondral defects with full-thickness defects in the central weightbearing portion of the lateral femoral condyle with an effusion. Agree with radiology interpretation.\  Narrative  CLINICAL DATA: 83 year old female with history of right knee pain.  EXAM: MRI OF THE RIGHT KNEE WITHOUT CONTRAST  TECHNIQUE: Multiplanar, multisequence MR imaging of the knee was performed. No intravenous contrast was administered.  COMPARISON: None Available.  FINDINGS: MENISCI  Medial: Increased intrasubstance signal of the posterior horn of the medial meniscus.  Lateral: Complex tear of the body, anterior and posterior horn of the lateral meniscus with extension to the  anterior root attachment. A 10 x 8 mm T2 hyperintense cystic structure in the anterior joint space, adjacent to the anterior root attachment of the lateral meniscus, likely represents a parameniscal cyst. There is extrusion into the lateral gutter.  LIGAMENTS  Cruciates: ACL and PCL are intact.  Collaterals: Medial collateral ligament is intact.  Lateral collateral ligament complex is intact.  CARTILAGE  Patellofemoral: No chondral defect.  Medial: Mild thinning and fibrillation of the medial compartment articular cartilage.  Lateral: High-grade chondral defect at the central weight-bearing portion of the lateral femoral condyle articular cartilage, measuring 8 mm transverse. Moderate to high-grade fissuring of the lateral tibial plateau articular cartilage.  JOINT: Moderate knee joint effusion. Hoffa's fat pad is within normal limits.  POPLITEAL FOSSA: Popliteus tendon is intact. No Baker's cyst.  EXTENSOR MECHANISM: Intact quadriceps tendon. Intact patellar tendon.  BONES: No aggressive osseous lesion. No fracture or dislocation. Mild tricompartmental osteophytosis.  Other: Muscles demonstrate normal morphology for age and signal intensity.  IMPRESSION: 1. Complex tear of the body, anterior and posterior horns of the lateral meniscus with extension to the anterior root attachment. There is extrusion into the lateral gutter. A 10 mm cystic structure in the anterior joint space, adjacent to the anterior root attachment of the lateral meniscus, is favored to represent a parameniscal cyst. 2. Degeneration of the posterior horn of the medial meniscus. 3. Tricompartmental osteoarthritis, most pronounced lateral compartment. 4. Moderate knee joint effusion.  Electronically Signed By: Hart Robinsons M.D. On: 01/01/2024 11:02  Assessment:  Right knee osteoarthritis, right knee lateral meniscus tear right knee meniscal cyst  Plan: Jodi [name] is an 83 year old female presents today for history and physical for right total knee arthroplasty with Dr. Audelia Acton on 03/04/2024. She has advanced right knee osteoarthritis seen on x-ray and MRI. She also has meniscal tears present. She has had conservative treatment consisting of injections, physical therapy with no improvement. Risks, benefits, complications of a right total knee  arthroplasty have been discussed with the patient. Patient has agreed and consented procedure with Dr. Audelia Acton on 03/04/2024.  The hospitalization and post-operative care and rehabilitation were also discussed. The use of perioperative antibiotics and DVT prophylaxis were discussed. The risk, benefits and alternatives to a surgical intervention were discussed at length with the patient. The patient was also advised of risks related to the medical comorbidities and elevated body mass index (BMI). A lengthy discussion took place to review the most common complications including but not limited to: stiffness, loss of function, complex regional pain syndrome, deep vein thrombosis, pulmonary embolus, heart attack, stroke, infection, wound breakdown, numbness, intraoperative fracture, damage to nerves, tendon,muscles, arteries or other blood vessels, death and other possible complications from anesthesia. The patient was told that we will take steps to minimize these risks by using sterile technique, antibiotics and DVT prophylaxis when appropriate and follow the patient postoperatively in the office setting to monitor progress. The possibility of recurrent pain, no improvement in pain and actual worsening of pain were also discussed with the patient. WE had a specific conversation about peroneal nerve injury due to correction of her mild contracture and valgus knee.  All questions answered, patient received clearance for right total knee arthroplasty.

## 2024-03-04 NOTE — Anesthesia Preprocedure Evaluation (Addendum)
 Anesthesia Evaluation  Patient identified by MRN, date of birth, ID band Patient awake    Reviewed: Allergy & Precautions, NPO status , Patient's Chart, lab work & pertinent test results  History of Anesthesia Complications (+) PONV and history of anesthetic complications  Airway Mallampati: IV   Neck ROM: Full    Dental  (+) Missing   Pulmonary neg pulmonary ROS   Pulmonary exam normal breath sounds clear to auscultation       Cardiovascular Normal cardiovascular exam Rhythm:Regular Rate:Normal  ECG 02/27/24:  Normal sinus rhythm Low voltage QRS Borderline ECG When compared with ECG of 11-Feb-2023 14:07, No significant change was found   Neuro/Psych  PSYCHIATRIC DISORDERS Anxiety     Vertigo     GI/Hepatic ,GERD  ,,  Endo/Other  Hypothyroidism  Obesity   Renal/GU negative Renal ROS     Musculoskeletal  (+) Arthritis ,    Abdominal   Peds  Hematology negative hematology ROS (+)   Anesthesia Other Findings   Reproductive/Obstetrics                             Anesthesia Physical Anesthesia Plan  ASA: 2  Anesthesia Plan: Spinal   Post-op Pain Management:    Induction: Intravenous  PONV Risk Score and Plan: 4 or greater and Propofol infusion, TIVA, Treatment may vary due to age or medical condition and Ondansetron  Airway Management Planned: Natural Airway and Nasal Cannula  Additional Equipment:   Intra-op Plan:   Post-operative Plan:   Informed Consent: I have reviewed the patients History and Physical, chart, labs and discussed the procedure including the risks, benefits and alternatives for the proposed anesthesia with the patient or authorized representative who has indicated his/her understanding and acceptance.       Plan Discussed with: CRNA  Anesthesia Plan Comments: (Plan for spinal and GA with natural airway, LMA/GETA backup.  Patient consented for risks of  anesthesia including but not limited to:  - adverse reactions to medications - damage to eyes, teeth, lips or other oral mucosa - nerve damage due to positioning  - sore throat or hoarseness - headache, bleeding, infection, nerve damage 2/2 spinal - damage to heart, brain, nerves, lungs, other parts of body or loss of life  Informed patient about role of CRNA in peri- and intra-operative care.  Patient voiced understanding.)        Anesthesia Quick Evaluation

## 2024-03-04 NOTE — Progress Notes (Signed)
PHARMACIST - PHYSICIAN ORDER COMMUNICATION  CONCERNING: P&T Medication Policy on Herbal Medications  DESCRIPTION:  This patient's order for:  Apoaequorin  has been noted.  This product(s) is classified as an "herbal" or natural product. Due to a lack of definitive safety studies or FDA approval, nonstandard manufacturing practices, plus the potential risk of unknown drug-drug interactions while on inpatient medications, the Pharmacy and Therapeutics Committee does not permit the use of "herbal" or natural products of this type within [facility]   ACTION TAKEN: The pharmacy department is unable to verify this order at this time and your patient has been informed of this safety policy. Please reevaluate patient's clinical condition at discharge and address if the herbal or natural product(s) should be resumed at that time.

## 2024-03-05 ENCOUNTER — Encounter: Payer: Self-pay | Admitting: Orthopedic Surgery

## 2024-03-05 LAB — BASIC METABOLIC PANEL
Anion gap: 11 (ref 5–15)
BUN: 21 mg/dL (ref 8–23)
CO2: 23 mmol/L (ref 22–32)
Calcium: 8.8 mg/dL — ABNORMAL LOW (ref 8.9–10.3)
Chloride: 104 mmol/L (ref 98–111)
Creatinine, Ser: 1.06 mg/dL — ABNORMAL HIGH (ref 0.44–1.00)
GFR, Estimated: 52 mL/min — ABNORMAL LOW (ref >60)
Glucose, Bld: 119 mg/dL — ABNORMAL HIGH (ref 70–99)
Potassium: 4.3 mmol/L (ref 3.5–5.1)
Sodium: 138 mmol/L (ref 135–145)

## 2024-03-05 LAB — CBC
HCT: 38.1 % (ref 36.0–46.0)
Hemoglobin: 12.7 g/dL (ref 12.0–15.0)
MCH: 31.4 pg (ref 26.0–34.0)
MCHC: 33.3 g/dL (ref 30.0–36.0)
MCV: 94.3 fL (ref 80.0–100.0)
Platelets: 254 10*3/uL (ref 150–400)
RBC: 4.04 MIL/uL (ref 3.87–5.11)
RDW: 12.7 % (ref 11.5–15.5)
WBC: 12.8 10*3/uL — ABNORMAL HIGH (ref 4.0–10.5)
nRBC: 0 % (ref 0.0–0.2)

## 2024-03-05 MED ORDER — CELECOXIB 100 MG PO CAPS: 100.0000 mg | ORAL_CAPSULE | Freq: Two times a day (BID) | ORAL | 0 refills | Status: AC

## 2024-03-05 MED ORDER — MAGNESIUM HYDROXIDE 400 MG/5ML PO SUSP
30.0000 mL | Freq: Every day | ORAL | Status: DC | PRN
  Administered 2024-03-05 – 2024-03-08 (×4): 30 mL via ORAL
  Filled 2024-03-05 (×4): qty 30

## 2024-03-05 MED ORDER — ENOXAPARIN SODIUM 40 MG/0.4ML IJ SOSY: 40.0000 mg | PREFILLED_SYRINGE | INTRAMUSCULAR | 0 refills | Status: AC

## 2024-03-05 MED ORDER — ONDANSETRON HCL 4 MG PO TABS
4.0000 mg | ORAL_TABLET | Freq: Four times a day (QID) | ORAL | 0 refills | Status: AC | PRN
Start: 2024-03-05 — End: ?

## 2024-03-05 MED ORDER — ACETAMINOPHEN 500 MG PO TABS: 1000.0000 mg | ORAL_TABLET | Freq: Three times a day (TID) | ORAL | Status: AC

## 2024-03-05 MED ORDER — BISACODYL 10 MG RE SUPP: 10.0000 mg | Freq: Every day | RECTAL | Status: DC | PRN

## 2024-03-05 MED ORDER — SODIUM CHLORIDE 0.9 % IV BOLUS
500.0000 mL | Freq: Once | INTRAVENOUS | Status: AC
  Administered 2024-03-05: 500 mL via INTRAVENOUS

## 2024-03-05 MED ORDER — DOCUSATE SODIUM 100 MG PO CAPS: 100.0000 mg | ORAL_CAPSULE | Freq: Two times a day (BID) | ORAL | Status: AC

## 2024-03-05 MED ORDER — TRAMADOL HCL 50 MG PO TABS: 50.0000 mg | ORAL_TABLET | Freq: Four times a day (QID) | ORAL | 0 refills | Status: AC | PRN

## 2024-03-05 NOTE — Progress Notes (Signed)
 Mobility Specialist - Progress Note   03/05/24 1041  Mobility  Activity Transferred from chair to bed  Level of Assistance +2 (takes two people) Audiological scientist)  Location manager Ambulated (ft) 4 ft  Activity Response Tolerated well  Mobility visit 1 Mobility  Mobility Specialist Start Time (ACUTE ONLY) 1024  Mobility Specialist Stop Time (ACUTE ONLY) 1033  Mobility Specialist Time Calculation (min) (ACUTE ONLY) 9 min   Pt sitting in the recliner upon entry, utilizing RA. Pt requesting to transfer to bed. Pt STS to RW MinA +2 for safety and transferred to bed via SPT CGA-- min vocal cuing for backwards steps toward the bed. Pt left supine with alarm set and needs within reach. Mobility tech present at bedside.  Zetta Bills Mobility Specialist 03/05/24 10:46 AM

## 2024-03-05 NOTE — Discharge Summary (Signed)
 Physician Discharge Summary  Patient ID: Jodi [name] MRN: [id number] DOB/AGE: 1941/11/04 83 y.o.  Admit date: 03/04/2024 Discharge date: 03/09/2024  Admission Diagnoses:  S/P total knee arthroplasty, right [Z96.651]   Discharge Diagnoses: Patient Active Problem List   Diagnosis Date Noted   S/P total knee arthroplasty, right 03/04/2024   S/P TKR (total knee replacement) using cement, left 07/04/2022   DIVERTICULITIS, COLON 08/15/2009   Acute on chronic cholecystitis 08/14/2009   LIVER FUNCTION TESTS, ABNORMAL, HX OF 08/14/2009   ABDOMINAL PAIN, RIGHT UPPER QUADRANT, HX OF 08/14/2009    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 female who was admitted 03/04/2024 with a diagnosis of S/P total knee arthroplasty, right and went to the operating room on 03/04/2024 and underwent the above named procedures.    Surgeries: Procedure(s): ARTHROPLASTY, KNEE, TOTAL on 03/04/2024 Patient tolerated the surgery well. Taken to PACU where she was stabilized and then transferred to the orthopedic floor.  Started on Lovenox 30 mg q 12 hrs. TEDs and SCDs applied bilaterally. Heels elevated on bed. No evidence of DVT. Negative Homan. Physical therapy started on day #1 for gait training and transfer. OT started day #1 for ADL and assisted devices.  Patient's IV was d/c on day #1.Slow progress with PT. Awaiting SNF approval POD 2-5. Patient stable. Pain well controlled. Labs and VSS.  On post op day #5 patient was stable and ready  for discharge to SNF.  Implants: Femur: Persona Size 7 CR   Tibia: Persona Size D w/ 14x29mm stem extension  Poly: 10mm MC  Patella: 29x78mm symmetric   She was given perioperative antibiotics:  Anti-infectives (From admission, onward)    Start     Dose/Rate Route Frequency Ordered Stop   03/04/24 1915  ceFAZolin (ANCEF) IVPB 2g/100 mL premix        2 g 200 mL/hr over 30 Minutes Intravenous Every 6 hours 03/04/24 1822 03/05/24 0112   03/04/24 0600  ceFAZolin (ANCEF) IVPB 2g/100 mL premix        2 g 200 mL/hr over 30 Minutes Intravenous On call to O.R. 03/03/24 2152 03/04/24 1353     .  She was given sequential compression devices, early ambulation, and Lovenox TEDs for DVT prophylaxis.  She benefited maximally from the hospital stay and there were no complications.    Recent vital signs:  Vitals:   03/09/24 0435 03/09/24 0706  BP: 127/66 122/72  Pulse: 84 77  Resp: 17 18  Temp: 97.6 F (36.4 C) 97.9 F (36.6 C)  SpO2: 96% 98%    Recent laboratory studies:  Lab Results  Component Value Date   HGB 12.7 03/06/2024   HGB 12.7 03/05/2024   HGB 14.0 02/27/2024   Lab Results  Component Value Date   WBC 10.5 03/06/2024   PLT 216 03/06/2024   No results found for: "INR" Lab Results  Component Value Date   NA 138 03/05/2024  K 4.3 03/05/2024   CL 104 03/05/2024   CO2 23 03/05/2024   BUN 21 03/05/2024   CREATININE 1.06 (H) 03/05/2024   GLUCOSE 119 (H) 03/05/2024    Discharge Medications:   Allergies as of 03/09/2024       Reactions   Lyrica [pregabalin]    May have seen double    Oxycodone-acetaminophen Itching        Medication List     STOP taking these medications    ibuprofen 800 MG tablet Commonly known as: ADVIL   traMADol-acetaminophen 37.5-325 MG tablet Commonly known as: ULTRACET       TAKE these medications    acetaminophen 500 MG tablet Commonly known as: TYLENOL Take 2 tablets (1,000 mg total) by mouth every 8 (eight) hours.    calcium carbonate 500 MG chewable tablet Commonly known as: TUMS - dosed in mg elemental calcium Chew 2 tablets by mouth as needed for indigestion or heartburn.   celecoxib 100 MG capsule Commonly known as: CeleBREX Take 1 capsule (100 mg total) by mouth 2 (two) times daily for 7 days.   chlorhexidine 4 % external liquid Commonly known as: HIBICLENS Apply 15 mLs (1 Application total) topically as directed for 30 doses. Use as directed daily for 5 days every other week for 6 weeks.   cholecalciferol 25 MCG (1000 UNIT) tablet Commonly known as: VITAMIN D3 Take 1,000 Units by mouth daily.   cyanocobalamin 1000 MCG tablet Commonly known as: VITAMIN B12 Take 1,000 mcg by mouth daily.   cyclobenzaprine 10 MG tablet Commonly known as: FLEXERIL Take 10 mg by mouth at bedtime.   cycloSPORINE 0.05 % ophthalmic emulsion Commonly known as: RESTASIS Place 1 drop into both eyes 2 (two) times daily.   docusate sodium 100 MG capsule Commonly known as: COLACE Take 1 capsule (100 mg total) by mouth 2 (two) times daily.   enoxaparin 40 MG/0.4ML injection Commonly known as: LOVENOX Inject 0.4 mLs (40 mg total) into the skin daily for 14 days.   esomeprazole 40 MG capsule Commonly known as: NEXIUM Take 40 mg by mouth daily.   gabapentin 300 MG capsule Commonly known as: NEURONTIN Take 600 mg by mouth at bedtime.   Leg Cramps Tabs Take 2 tablets by mouth 4 (four) times daily.   levothyroxine 100 MCG tablet Commonly known as: SYNTHROID Take 100 mcg by mouth daily before breakfast.   magnesium oxide 400 (240 Mg) MG tablet Commonly known as: MAG-OX Take 400 mg by mouth daily.   multivitamin capsule Take 1 capsule by mouth daily.   mupirocin ointment 2 % Commonly known as: BACTROBAN Place 1 Application into the nose 2 (two) times daily for 60 doses. Use as directed 2 times daily for 5 days every other week for 6 weeks.   ondansetron 4 MG tablet Commonly known as: ZOFRAN Take 1  tablet (4 mg total) by mouth every 6 (six) hours as needed for nausea.   Prevagen Extra Strength 20 MG Caps Generic drug: Apoaequorin Take 20 mg by mouth daily.   PROBIOTIC PO Take 1 capsule by mouth daily.   pyridOXINE 100 MG tablet Commonly known as: VITAMIN B6 Take 100 mg by mouth daily.   senna-docusate 8.6-50 MG tablet Commonly known as: Senokot-S Take 2 tablets by mouth daily as needed for mild constipation.   traMADol 50 MG tablet Commonly known as: ULTRAM Take 1 tablet (50 mg total) by mouth every 6 (six) hours as needed for moderate pain (pain score 4-6).  Diagnostic Studies: DG Knee 1-2 Views Right Result Date: 03/04/2024 CLINICAL DATA:  Status post knee replacement. EXAM: RIGHT KNEE - 1-2 VIEW COMPARISON:  None Available. FINDINGS: Right knee arthroplasty in expected alignment. No periprosthetic lucency or fracture. There has been patellar resurfacing. Recent postsurgical change includes air and edema in the soft tissues and joint space. IMPRESSION: Right knee arthroplasty without immediate postoperative complication. Electronically Signed   By: Narda Rutherford M.D.   On: 03/04/2024 16:15    Disposition:      Follow-up Information     Evon Slack, PA-C Follow up in 2 week(s).   Specialties: Orthopedic Surgery, Emergency Medicine Contact information: [address] [place] [address] [phone]                  Signed: Evon Slack 03/09/2024, 8:24 AM

## 2024-03-05 NOTE — Anesthesia Postprocedure Evaluation (Signed)
 Anesthesia Post Note  Patient: Waylan Rocher  Procedure(s) Performed: ARTHROPLASTY, KNEE, TOTAL (Right: Knee)  Patient location during evaluation: Nursing Unit Anesthesia Type: Spinal Level of consciousness: oriented and awake and alert Pain management: pain level controlled Vital Signs Assessment: post-procedure vital signs reviewed and stable Respiratory status: spontaneous breathing and respiratory function stable Cardiovascular status: blood pressure returned to baseline and stable Postop Assessment: no headache, no backache and no apparent nausea or vomiting Anesthetic complications: no   No notable events documented.   Last Vitals:  Vitals:   03/05/24 0041 03/05/24 0335  BP: 137/60 (!) 103/53  Pulse: 89 74  Resp: 18 18  Temp: 36.7 C 36.7 C  SpO2: 95% 94%    Last Pain:  Vitals:   03/05/24 0602  TempSrc:   PainSc: Asleep                 Jules Schick

## 2024-03-05 NOTE — Progress Notes (Addendum)
   Subjective: 1 Day Post-Op Procedure(s) (LRB): ARTHROPLASTY, KNEE, TOTAL (Right) Patient reports pain as mild.   Patient is well, and has had no acute complaints or problems Denies any CP, SOB, ABD pain. We will continue therapy today.  Plan is to go Home after hospital stay.  Objective: Vital signs in last 24 hours: Temp:  [97.8 F (36.6 C)-98.4 F (36.9 C)] 98.1 F (36.7 C) (03/14 0335) Pulse Rate:  [74-89] 74 (03/14 0335) Resp:  [12-20] 18 (03/14 0335) BP: (98-137)/(53-80) 103/53 (03/14 0335) SpO2:  [93 %-98 %] 94 % (03/14 0335) Weight:  [84.4 kg] 84.4 kg (03/13 1150)  Intake/Output from previous day: 03/13 0701 - 03/14 0700 In: 2940 [P.O.:740; I.V.:1600; IV Piggyback:600] Out: 1125 [Urine:1100; Blood:25] Intake/Output this shift: No intake/output data recorded.  Recent Labs    03/05/24 0519  HGB 12.7   Recent Labs    03/05/24 0519  WBC 12.8*  RBC 4.04  HCT 38.1  PLT 254   Recent Labs    03/05/24 0519  NA 138  K 4.3  CL 104  CO2 23  BUN 21  CREATININE 1.06*  GLUCOSE 119*  CALCIUM 8.8*   No results for input(s): "LABPT", "INR" in the last 72 hours.  EXAM General - Patient is Alert, Appropriate, and Oriented Extremity - Neurovascular intact Sensation intact distally Intact pulses distally Dressing - dressing C/D/I and no drainage Motor Function - intact, moving foot and toes well on exam.   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   Weight as of this encounter: 84.4 kg. Advance diet Up with therapy Pain controlled BP soft, give 500cc bolus of IV fluids. Monitor progress with PT CM to assist with discharge to home with HHPT today pending completion of PT goals  DVT Prophylaxis - Lovenox, TED hose, and SCDs Weight-Bearing as tolerated to right leg   T. Cranston Neighbor, PA-C [facility] 03/05/2024, 8:08 AM

## 2024-03-05 NOTE — Discharge Instructions (Signed)

## 2024-03-05 NOTE — Progress Notes (Signed)
 Physical Therapy Treatment Patient Details Name: Jodi [name] MRN: [id number] DOB: 01/23/1941 Today's Date: 03/05/2024   [name] Pt is an 83 yo female s/p R TKA. PMH of anxiety, GERD, hypothyroidism, L TKA.    PT Comments  Pt alert, agreeable to PT with some motivation, reported significant R knee pain that did improve over time per her report with mobility. Bed mobility with supervision, reliant on bed rails, extra time. Sit <> stand from EOB minA with RW. Extra time given to encourage pt's independence but still ultimately needed assistance. She was able to slowly progress distance in room to ~38ft total with RW, very slow cadence, antalgic gait noted. Returned to bed with intentions to transfer to recliner for dinner. Pt was able to stand from EOB with CGA and RUE on bed rail, stated she just needed to "loosen up". The patient would benefit from further skilled PT intervention to continue to progress towards goals.    If plan is discharge home, recommend the following: Two people to help with walking and/or transfers;Two people to help with bathing/dressing/bathroom   Can travel by private vehicle        Equipment Recommendations  Rolling walker (2 wheels)    Recommendations for Other Services       Precautions / Restrictions Precautions Precautions: Fall Restrictions Weight Bearing Restrictions Per Provider Order: No     Mobility  Bed Mobility Overal bed mobility: Needs Assistance Bed Mobility: Supine to Sit     Supine to sit: Supervision, Used rails, HOB elevated     General bed mobility comments: extra time    Transfers Overall transfer level: Needs assistance Equipment used: Rolling walker (2 wheels), None Transfers: Sit to/from Stand Sit to Stand: Min assist           General transfer comment: from EOB extra time given to maximize pt independence, but did need minA ultimately. however at end of session after "warming up" she was able to  stand with RUE support on bed rail, CGA.    Ambulation/Gait Ambulation/Gait assistance: Contact guard assist Gait Distance (Feet): 25 Feet Assistive device: Rolling walker (2 wheels)         General Gait Details: in room, pt reported improvement in pain over time   Stairs             Wheelchair Mobility     Tilt Bed    Modified Rankin (Stroke Patients Only)       Balance Overall balance assessment: Needs assistance Sitting-balance support: Feet supported Sitting balance-Leahy Scale: Fair     Standing balance support: Bilateral upper extremity supported, During functional activity, Reliant on assistive device for balance Standing balance-Leahy Scale: Poor                              Communication Communication Communication: No apparent difficulties  Cognition Arousal: Alert Behavior During Therapy: WFL for tasks assessed/performed                             Following commands: Intact      Cueing Cueing Techniques: Verbal cues, Tactile cues  Exercises Total Joint Exercises Ankle Circles/Pumps: AROM, Both, 10 reps Heel Slides: AAROM, Right, 10 reps, AROM, Left Hip ABduction/ADduction: AROM, Both, 10 reps    General Comments General comments (skin integrity, edema, etc.): On room air; vitals stable.      Pertinent  Vitals/Pain Pain Assessment Pain Assessment: Faces Faces Pain Scale: Hurts whole lot Pain Location: R knee (TKA) Pain Descriptors / Indicators: Aching, Pressure, Grimacing, Guarding, Moaning Pain Intervention(s): Limited activity within patient's tolerance, Monitored during session, Repositioned, Ice applied    Home Living Family/patient expects to be discharged to:: Private residence Living Arrangements: Spouse/significant other Available Help at Discharge: Family;Available 24 hours/day (Daughter there in evenings) Type of Home: House Home Access: Stairs to enter (one step)   Secretary/administrator of  Steps: 1   Home Layout: Able to live on main level with bedroom/bathroom;Other (Comment) (basement with chair lift access) Home Equipment: Rolling Walker (2 wheels);Rollator (4 wheels);BSC/3in1;Shower seat      Prior Function            PT Goals (current goals can now be found in the care plan section) Acute Rehab PT Goals Patient Stated Goal: to go home PT Goal Formulation: With patient Time For Goal Achievement: 03/19/24 Potential to Achieve Goals: Good Progress towards PT goals: Progressing toward goals    Frequency    BID      PT Plan      Co-evaluation PT/OT/SLP Co-Evaluation/Treatment: Yes Reason for Co-Treatment: To address functional/ADL transfers;Necessary to address cognition/behavior during functional activity PT goals addressed during session: Mobility/safety with mobility OT goals addressed during session: ADL's and self-care      AM-PAC PT "6 Clicks" Mobility   Outcome Measure  Help needed turning from your back to your side while in a flat bed without using bedrails?: A Lot Help needed moving from lying on your back to sitting on the side of a flat bed without using bedrails?: A Lot Help needed moving to and from a bed to a chair (including a wheelchair)?: A Lot Help needed standing up from a chair using your arms (e.g., wheelchair or bedside chair)?: A Lot Help needed to walk in hospital room?: A Lot Help needed climbing 3-5 steps with a railing? : Total 6 Click Score: 11    End of Session Equipment Utilized During Treatment: Gait belt Activity Tolerance: Patient tolerated treatment well Patient left: in chair;with chair alarm set (with OT)   PT Visit Diagnosis: Other abnormalities of gait and mobility (R26.89);Difficulty in walking, not elsewhere classified (R26.2);Muscle weakness (generalized) (M62.81);Pain Pain - Right/Left: Right Pain - part of body: Knee     Time: 1610-9604 PT Time Calculation (min) (ACUTE ONLY): 23 min  Charges:     $Therapeutic Activity: 23-37 mins PT General Charges $$ ACUTE PT VISIT: 1 Visit                     Olga Coaster PT, DPT 2:08 PM,03/05/24

## 2024-03-05 NOTE — Evaluation (Signed)
 Occupational Therapy Evaluation Patient Details Name: Jodi [name] MRN: [id number] DOB: 1941/08/04 Today's Date: 03/05/2024   [name]   S/p R TKA. 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     Clinical Impressions Patient received for OT evaluation. See flowsheet below for details of function. Generally, patient requiring significant assistance for bed mobility (as reported by PT), MIN-MOD A X2 for functional mobility approx 6 feet with RW and cues, and MIN-MAX A for ADLs. Patient will benefit from continued OT while in acute care.      If plan is discharge home, recommend the following:   Two people to help with walking and/or transfers;A lot of help with bathing/dressing/bathroom;Assistance with cooking/housework;Direct supervision/assist for financial management;Direct supervision/assist for medications management;Assist for transportation;Help with stairs or ramp for entrance;Supervision due to cognitive status     Functional Status Assessment   Patient has had a recent decline in their functional status and demonstrates the ability to make significant improvements in function in a reasonable and predictable amount of time.     Equipment Recommendations   None recommended by OT     Recommendations for Other Services         Precautions/Restrictions   Precautions Precautions: Fall Restrictions Weight Bearing Restrictions Per Provider Order: No     Mobility Bed Mobility               General bed mobility comments: Not tested; pt found on BSC with PT in room.    Transfers Overall transfer level: Needs assistance Equipment used: Rolling walker (2 wheels) (gait belt) Transfers: Sit to/from Stand Sit to Stand: Min assist, Mod assist, +2 physical assistance           General transfer comment: Limbs shaky during transfer.      Balance Overall balance assessment: Needs assistance Sitting-balance support: Feet supported Sitting  balance-Leahy Scale: Good     Standing balance support: Bilateral upper extremity supported, During functional activity, Reliant on assistive device for balance Standing balance-Leahy Scale: Poor                             ADL either performed or assessed with clinical judgement   ADL Overall ADL's : Needs assistance/impaired                         Toilet Transfer: Moderate assistance;Cueing for safety;Cueing for sequencing;Stand-pivot;BSC/3in1 Toilet Transfer Details (indicate cue type and reason): using RW Toileting- Clothing Manipulation and Hygiene: Moderate assistance;Sit to/from stand;+2 for physical assistance Toileting - Clothing Manipulation Details (indicate cue type and reason): assist for standing balance while pt performing toilet hygiene (and assist for gown management and holding RW/BSC to keep from tipping)     Functional mobility during ADLs: Minimal assistance;Moderate assistance;+2 for physical assistance;Rolling walker (2 wheels) General ADL Comments: Unstable during mobility, needing hands-on assist. Will require assist with all LB dressing, shower transfers, showering, toileting; grooming from seated position only. Will require assist with all IADLs. Decreased insight into her deficits. Recommendation provided to pt that her daughter be present with her while she is transferring in/out of shower and while showering once home.     Vision Baseline Vision/History: 1 Wears glasses Patient Visual Report: No change from baseline       Perception         Praxis         Pertinent Vitals/Pain Pain Assessment Pain Assessment:  0-10 Pain Score:  (unrated, moderate.) Pain Location: R knee (TKA) Pain Descriptors / Indicators: Aching Pain Intervention(s): Limited activity within patient's tolerance, Repositioned, Ice applied     Extremity/Trunk Assessment Upper Extremity Assessment Upper Extremity Assessment: Right hand dominant;Overall [facility]  for tasks assessed   Lower Extremity Assessment Lower Extremity Assessment: RLE deficits/detail RLE Deficits / Details: TKA   Cervical / Trunk Assessment Cervical / Trunk Assessment: Normal   Communication Communication Communication: No apparent difficulties   Cognition Arousal: Alert Behavior During Therapy: WFL for tasks assessed/performed Cognition: History of cognitive impairments             OT - Cognition Comments: Pt reports STM deficits; reports she does not remember hospitalization after her prior TKA (she went to rehab after that). Pt is pleasant during OT session, able to follow commands, and able to discuss prior level and home set-up. However, pt demonstrates decreased insight into her current impairments, asking if she can just get up from the chair later today with husband assist and get back to the bed, even though she required x2 for safety to walk to chair a few minutes prior. Patient reporting she thinks she can use her rollator at home post-surgery, even though this was not advised by PT (recommended RW). Despite PT stating that she would return this afternoon to mobilize further, pt later telling OT she doesn't need to order lunch because she's hoping she'll be leaving today; OT encouraged pt to order lunch and reminded her that PT is coming this afternoon.                 Following commands: Intact       Cueing  General Comments   Cueing Techniques: Verbal cues;Tactile cues  On room air; vitals stable.   Exercises     Shoulder Instructions      Home Living Family/patient expects to be discharged to:: Private residence Living Arrangements: Spouse/significant other Available Help at Discharge: Family;Available 24 hours/day (Daughter there in evenings) Type of Home: House Home Access: Stairs to enter (one step) Secretary/administrator of Steps: 1   Home Layout: Able to live on main level with bedroom/bathroom;Other (Comment) (basement with chair  lift access)     Bathroom Shower/Tub: Walk-in shower   Bathroom Toilet: Standard     Home Equipment: Agricultural consultant (2 wheels);Rollator (4 wheels);BSC/3in1;Shower seat          Prior Functioning/Environment Prior Level of Function : Needs assist             Mobility Comments: walks with rollator "sometimes" (sounds as if she furniture cruises often). States she has back pain and likes to use rollator to sit and rest. ADLs Comments: Mostly (I) with ADLs; intermittent assistance with LB dressing (husband assists). Husband intermittently reminds her about medications and retrieves them. Pt does admit to STM deficits.    OT Problem List: Decreased strength;Decreased range of motion;Decreased activity tolerance;Impaired balance (sitting and/or standing);Decreased cognition;Decreased safety awareness;Decreased knowledge of use of DME or AE   OT Treatment/Interventions: Self-care/ADL training;Therapeutic exercise;DME and/or AE instruction;Therapeutic activities;Cognitive remediation/compensation;Patient/family education      OT Goals(Current goals can be found in the care plan section)   Acute Rehab OT Goals Patient Stated Goal: Go home OT Goal Formulation: With patient Time For Goal Achievement: 03/19/24 Potential to Achieve Goals: Good ADL Goals Pt Will Perform Grooming: with supervision;standing Pt Will Perform Lower Body Dressing: with adaptive equipment;sit to/from stand;with min assist Pt Will Transfer to Toilet: with modified  independence;bedside commode;ambulating Pt Will Perform Toileting - Clothing Manipulation and hygiene: with modified independence;sit to/from stand   OT Frequency:  Min 2X/week    Co-evaluation              AM-PAC OT "6 Clicks" Daily Activity     Outcome Measure Help from another person eating meals?: None Help from another person taking care of personal grooming?: A Little Help from another person toileting, which includes using toliet,  bedpan, or urinal?: A Lot Help from another person bathing (including washing, rinsing, drying)?: A Lot Help from another person to put on and taking off regular upper body clothing?: A Little Help from another person to put on and taking off regular lower body clothing?: A Lot 6 Click Score: 16   End of Session Equipment Utilized During Treatment: Gait belt;Rolling walker (2 wheels) (BSC) Nurse Communication: Mobility status  Activity Tolerance: Patient limited by pain;Patient tolerated treatment well Patient left: in chair;with chair alarm set;with call bell/phone within reach;with family/visitor present  OT Visit Diagnosis: Unsteadiness on feet (R26.81)                Time: 0935-1000 OT Time Calculation (min): 25 min Charges:  OT General Charges $OT Visit: 1 Visit OT Evaluation $OT Eval Moderate Complexity: 1 Mod OT Treatments $Self Care/Home Management : 8-22 mins  Linward Foster, MS, OTR/L  Alvester Morin 03/05/2024, 10:46 AM

## 2024-03-05 NOTE — Evaluation (Addendum)
 Physical Therapy Evaluation Patient Details Name: Jodi [name] MRN: [id number] DOB: [birth year] Today's Date: 03/05/2024  [name]  Pt is an 83 yo female s/p R TKA. PMH of anxiety, GERD, hypothyroidism, L TKA.  Clinical Impression  Patient alert, agreeable to PT. At baseline she reported use of rollator or holds on to furniture, and that her husband helps her with dressing (lower body dressing). She required ROM for R knee flexion, able to perform other exercises with verbal cues. Noted to have decreased knee flexion on LLE as well. Supine to sit minA. maxA  from EOB, minAx2 from [facility], did progress to walking ~8ft to recliner minAx2.  Overall the patient demonstrated deficits (see "PT Problem List") that impede the patient's functional abilities, safety, and mobility and would benefit from skilled PT intervention.          If plan is discharge home, recommend the following: Two people to help with walking and/or transfers;Two people to help with bathing/dressing/bathroom   Can travel by private vehicle        Equipment Recommendations Rolling walker (2 wheels)  Recommendations for Other Services       Functional Status Assessment Patient has had a recent decline in their functional status and demonstrates the ability to make significant improvements in function in a reasonable and predictable amount of time.     Precautions / Restrictions Precautions Precautions: Fall Restrictions Weight Bearing Restrictions Per Provider Order: No      Mobility  Bed Mobility Overal bed mobility: Needs Assistance Bed Mobility: Supine to Sit     Supine to sit: Min assist, Used rails, HOB elevated     General bed mobility comments: extra time    Transfers Overall transfer level: Needs assistance Equipment used: Rolling walker (2 wheels) Transfers: Sit to/from Stand Sit to Stand: Min assist, Mod assist, +2 physical assistance, Max assist           General  transfer comment: MaxA from EOB, minAx2 for BSC but also needed BSC stablized, RW stabilized    Ambulation/Gait Ambulation/Gait assistance: Min assist, Contact guard assist, +2 safety/equipment Gait Distance (Feet): 5 Feet Assistive device: Rolling walker (2 wheels)         General Gait Details: limited due to pain; fatigued very quickly  Stairs            Wheelchair Mobility     Tilt Bed    Modified Rankin (Stroke Patients Only)       Balance Overall balance assessment: Needs assistance Sitting-balance support: Feet supported Sitting balance-Leahy Scale: Fair     Standing balance support: Bilateral upper extremity supported, During functional activity, Reliant on assistive device for balance Standing balance-Leahy Scale: Poor                               Pertinent Vitals/Pain Pain Assessment Pain Assessment: Faces Faces Pain Scale: Hurts whole lot Pain Location: R knee (TKA) Pain Descriptors / Indicators: Aching, Pressure, Grimacing, Guarding, Moaning Pain Intervention(s): Limited activity within patient's tolerance, Monitored during session, Repositioned    Home Living Family/patient expects to be discharged to:: Private residence Living Arrangements: Spouse/significant other Available Help at Discharge: Family;Available 24 hours/day (Daughter there in evenings) Type of Home: House Home Access: Stairs to enter (one step)   Secretary/administrator of Steps: 1   Home Layout: Able to live on main level with bedroom/bathroom;Other (Comment) (basement with chair lift access) Home Equipment: Rolling  Walker (2 wheels);Rollator (4 wheels);BSC/3in1;Shower seat      Prior Function Prior Level of Function : Needs assist             Mobility Comments: walks with rollator "sometimes" (sounds as if she furniture cruises often). States she has back pain and likes to use rollator to sit and rest. ADLs Comments: Mostly (I) with ADLs; intermittent  assistance with LB dressing (husband assists). Husband intermittently reminds her about medications and retrieves them. Pt does admit to STM deficits.     Extremity/Trunk Assessment   Upper Extremity Assessment Upper Extremity Assessment: Defer to OT evaluation    Lower Extremity Assessment Lower Extremity Assessment: Generalized weakness (able to move both BLE against gravity but L knee flexion limited at baseline, limited R knee flexion noted as well s/p TKA) RLE Deficits / Details: TKA    Cervical / Trunk Assessment Cervical / Trunk Assessment: Normal  Communication   Communication Communication: No apparent difficulties    Cognition Arousal: Alert Behavior During Therapy: WFL for tasks assessed/performed                             Following commands: Intact       Cueing Cueing Techniques: Verbal cues, Tactile cues     General Comments General comments (skin integrity, edema, etc.): On room air; vitals stable.    Exercises Total Joint Exercises Ankle Circles/Pumps: AROM, Both, 10 reps Heel Slides: AAROM, Right, 10 reps, AROM, Left Hip ABduction/ADduction: AROM, Both, 10 reps   Assessment/Plan    PT Assessment Patient needs continued PT services  PT Problem List Decreased strength;Decreased range of motion;Decreased activity tolerance;Decreased balance;Decreased mobility;Decreased knowledge of precautions;Decreased knowledge of use of DME;Pain       PT Treatment Interventions Balance training;DME instruction;Gait training;Neuromuscular re-education;Stair training;Functional mobility training;Patient/family education;Therapeutic activities;Therapeutic exercise    PT Goals (Current goals can be found in the Care Plan section)  Acute Rehab PT Goals Patient Stated Goal: to go home PT Goal Formulation: With patient Time For Goal Achievement: 03/19/24 Potential to Achieve Goals: Good    Frequency BID     Co-evaluation PT/OT/SLP  Co-Evaluation/Treatment: Yes Reason for Co-Treatment: To address functional/ADL transfers;Necessary to address cognition/behavior during functional activity PT goals addressed during session: Mobility/safety with mobility OT goals addressed during session: ADL's and self-care       AM-PAC PT "6 Clicks" Mobility  Outcome Measure Help needed turning from your back to your side while in a flat bed without using bedrails?: A Lot Help needed moving from lying on your back to sitting on the side of a flat bed without using bedrails?: A Lot Help needed moving to and from a bed to a chair (including a wheelchair)?: A Lot Help needed standing up from a chair using your arms (e.g., wheelchair or bedside chair)?: A Lot Help needed to walk in hospital room?: A Lot Help needed climbing 3-5 steps with a railing? : Total 6 Click Score: 11    End of Session Equipment Utilized During Treatment: Gait belt Activity Tolerance: Patient tolerated treatment well Patient left: in chair;with chair alarm set (with OT)   PT Visit Diagnosis: Other abnormalities of gait and mobility (R26.89);Difficulty in walking, not elsewhere classified (R26.2);Muscle weakness (generalized) (M62.81);Pain Pain - Right/Left: Right Pain - part of body: Knee    Time: 6045-4098 PT Time Calculation (min) (ACUTE ONLY): 34 min   Charges:   PT Evaluation $PT Eval Low Complexity: 1 Low  PT Treatments $Therapeutic Activity: 8-22 mins PT General Charges $$ ACUTE PT VISIT: 1 Visit         Olga Coaster PT, DPT 11:53 AM,03/05/24

## 2024-03-06 DIAGNOSIS — Z6834 Body mass index (BMI) 34.0-34.9, adult: Secondary | ICD-10-CM | POA: Diagnosis not present

## 2024-03-06 DIAGNOSIS — Z79890 Hormone replacement therapy (postmenopausal): Secondary | ICD-10-CM | POA: Diagnosis not present

## 2024-03-06 DIAGNOSIS — M1711 Unilateral primary osteoarthritis, right knee: Secondary | ICD-10-CM | POA: Diagnosis present

## 2024-03-06 DIAGNOSIS — Z825 Family history of asthma and other chronic lower respiratory diseases: Secondary | ICD-10-CM | POA: Diagnosis not present

## 2024-03-06 DIAGNOSIS — Z90710 Acquired absence of both cervix and uterus: Secondary | ICD-10-CM | POA: Diagnosis not present

## 2024-03-06 DIAGNOSIS — Z96652 Presence of left artificial knee joint: Secondary | ICD-10-CM | POA: Diagnosis present

## 2024-03-06 DIAGNOSIS — K219 Gastro-esophageal reflux disease without esophagitis: Secondary | ICD-10-CM | POA: Diagnosis present

## 2024-03-06 DIAGNOSIS — G629 Polyneuropathy, unspecified: Secondary | ICD-10-CM | POA: Diagnosis present

## 2024-03-06 DIAGNOSIS — Z833 Family history of diabetes mellitus: Secondary | ICD-10-CM | POA: Diagnosis not present

## 2024-03-06 DIAGNOSIS — E669 Obesity, unspecified: Secondary | ICD-10-CM | POA: Diagnosis present

## 2024-03-06 DIAGNOSIS — Z823 Family history of stroke: Secondary | ICD-10-CM | POA: Diagnosis not present

## 2024-03-06 DIAGNOSIS — E039 Hypothyroidism, unspecified: Secondary | ICD-10-CM | POA: Diagnosis present

## 2024-03-06 DIAGNOSIS — E785 Hyperlipidemia, unspecified: Secondary | ICD-10-CM | POA: Diagnosis present

## 2024-03-06 DIAGNOSIS — Z9049 Acquired absence of other specified parts of digestive tract: Secondary | ICD-10-CM | POA: Diagnosis not present

## 2024-03-06 LAB — CBC
HCT: 39 % (ref 36.0–46.0)
Hemoglobin: 12.7 g/dL (ref 12.0–15.0)
MCH: 30.7 pg (ref 26.0–34.0)
MCHC: 32.6 g/dL (ref 30.0–36.0)
MCV: 94.2 fL (ref 80.0–100.0)
Platelets: 216 10*3/uL (ref 150–400)
RBC: 4.14 MIL/uL (ref 3.87–5.11)
RDW: 12.9 % (ref 11.5–15.5)
WBC: 10.5 10*3/uL (ref 4.0–10.5)
nRBC: 0 % (ref 0.0–0.2)

## 2024-03-06 MED ORDER — SODIUM CHLORIDE 0.9 % IV BOLUS
500.0000 mL | Freq: Once | INTRAVENOUS | Status: AC
  Administered 2024-03-06: 500 mL via INTRAVENOUS

## 2024-03-06 NOTE — Progress Notes (Signed)
   Subjective: 2 Days Post-Op Procedure(s) (LRB): ARTHROPLASTY, KNEE, TOTAL (Right) Patient reports pain as moderate, increased from yesterday  Patient is well, and has had no acute complaints or problems Denies any CP, SOB, ABD pain. Just has some pain in her stomach from the lovenox injection and up her quad and around her knee. We will continue therapy today.  Plan is to go Home vs SNF after hospital stay.  Objective: Vital signs in last 24 hours: Temp:  [97.8 F (36.6 C)-97.9 F (36.6 C)] 97.9 F (36.6 C) (03/15 0356) Pulse Rate:  [76-88] 88 (03/15 0356) Resp:  [16-18] 16 (03/15 0356) BP: (113-134)/(56-67) 113/62 (03/15 0356) SpO2:  [92 %-97 %] 92 % (03/15 0356)  Intake/Output from previous day: 03/14 0701 - 03/15 0700 In: 950 [P.O.:450; I.V.:500] Out: 425 [Urine:425] Intake/Output this shift: No intake/output data recorded.  Recent Labs    03/05/24 0519 03/06/24 0158  HGB 12.7 12.7   Recent Labs    03/05/24 0519 03/06/24 0158  WBC 12.8* 10.5  RBC 4.04 4.14  HCT 38.1 39.0  PLT 254 216   Recent Labs    03/05/24 0519  NA 138  K 4.3  CL 104  CO2 23  BUN 21  CREATININE 1.06*  GLUCOSE 119*  CALCIUM 8.8*   No results for input(s): "LABPT", "INR" in the last 72 hours.  EXAM General - Patient is Alert, Appropriate, and Oriented Extremity - Neurovascular intact Sensation intact distally Intact pulses distally Dressing - dressing C/D/I and no drainage Motor Function - intact, moving foot and toes well on exam.   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   Weight as of this encounter: 84.4 kg. Advance diet Up with therapy Pain increased, patient receiving pain meds with provider visit BP soft/stable and patient not taking in PO fluids well per Nursing, given 500cc bolus of IV fluids.  Monitor progress with PT Given patient's level of discomfort and overall function with PT thus far, discussed with the patient that it may be safer to get her to a SNF for rehabilitation post-op. TOC referral placed  DVT Prophylaxis - Lovenox, TED hose, and SCDs Weight-Bearing as tolerated to right leg   Jodi Edge, PA-C [facility] 03/06/2024, 11:31 AM

## 2024-03-06 NOTE — Progress Notes (Signed)
 Physical Therapy Treatment Patient Details Name: Jodi [name] MRN: [id number] DOB: [birth year] Today's Date: 03/06/2024   [name] Pt is an 83 yo female s/p R TKA. PMH of anxiety, GERD, hypothyroidism, L TKA.    PT Comments  Pt found attempting to get OOB to use BSC without initiating call bell. Education on fall risks provided. Pt incontinent of urine in bed and while transferring to [facility] with RW. Pt continues to moan and cry due to R knee pain despite receiving morphine earlier. ModA for bed mobility and transfers at this time with very limited gait tolerance with RW. Poor tolerance for P/AAROM R knee with limited full extension noted. Pt refused chair post use of BSC (no BM), assisted back to bed and positioned to comfort with all needs in reach. Ice & SCD's applied, bed alarm on. Continue PT per POC. Anticipate STR needed at d/c as initially recommended.    If plan is discharge home, recommend the following: Two people to help with walking and/or transfers;Two people to help with bathing/dressing/bathroom   Can travel by private vehicle     No  Equipment Recommendations  Rolling walker (2 wheels)    Recommendations for Other Services       Precautions / Restrictions Precautions Precautions: Fall Recall of Precautions/Restrictions: Intact Restrictions Weight Bearing Restrictions Per Provider Order: Yes Other Position/Activity Restrictions: WBAT R LE     Mobility  Bed Mobility Overal bed mobility: Needs Assistance Bed Mobility: Supine to Sit, Sit to Supine     Supine to sit: Mod assist, Used rails Sit to supine: Min assist (For R LE)   General bed mobility comments: Pt unable to help due to high focus on pain    Transfers Overall transfer level: Needs assistance Equipment used: Rolling walker (2 wheels), None Transfers: Sit to/from Stand Sit to Stand: Mod assist           General transfer comment: Increased assistance needed to stand from  chair and BSC this date due to pain level despite being pre-mendicated    Ambulation/Gait Ambulation/Gait assistance: Contact guard assist, Min assist Gait Distance (Feet): 4 Feet Assistive device: Rolling walker (2 wheels) Gait Pattern/deviations: Step-to pattern, Decreased stance time - right, Antalgic, Trunk flexed Gait velocity: decr     General Gait Details: Decreased tolerance for gait training this date due to increased pain level.   Stairs             Wheelchair Mobility     Tilt Bed    Modified Rankin (Stroke Patients Only)       Balance Overall balance assessment: Needs assistance Sitting-balance support: Feet supported Sitting balance-Leahy Scale: Fair     Standing balance support: Bilateral upper extremity supported, During functional activity, Reliant on assistive device for balance Standing balance-Leahy Scale: Poor Standing balance comment: High fall risk, poor tolerance on accepting wt through R LE                            Communication Communication Communication: No apparent difficulties  Cognition Arousal: Alert Behavior During Therapy: WFL for tasks assessed/performed   PT - Cognitive impairments: Safety/Judgement, Problem solving                       PT - Cognition Comments: Pt tearful and moaning, tensing up throughout session requiring Max cues to mobilize Following commands: Intact      Cueing Cueing Techniques:  Verbal cues, Tactile cues  Exercises Total Joint Exercises Ankle Circles/Pumps: AROM, Both, 10 reps General Exercises - Lower Extremity Quad Sets: AROM, Right, 5 reps, Supine Long Arc Quad: AAROM, Right, 10 reps, Seated Heel Slides: AAROM, Right, 5 reps, Limitations Heel Slides Limitations: Poor tolerance for minimal ROM    General Comments General comments (skin integrity, edema, etc.): Pt found attempting to get out of bed to use BSC without initiating call bell      Pertinent Vitals/Pain  Pain Assessment Pain Assessment: 0-10 Pain Score: 9  Pain Location: R knee (TKA) Pain Descriptors / Indicators: Aching, Discomfort, Grimacing, Guarding, Moaning, Operative site guarding Pain Intervention(s): Monitored during session, Ice applied    Home Living                          Prior Function            PT Goals (current goals can now be found in the care plan section) Acute Rehab PT Goals Patient Stated Goal: to go home    Frequency    BID      PT Plan      Co-evaluation              AM-PAC PT "6 Clicks" Mobility   Outcome Measure  Help needed turning from your back to your side while in a flat bed without using bedrails?: A Lot Help needed moving from lying on your back to sitting on the side of a flat bed without using bedrails?: A Lot Help needed moving to and from a bed to a chair (including a wheelchair)?: A Lot Help needed standing up from a chair using your arms (e.g., wheelchair or bedside chair)?: A Lot Help needed to walk in hospital room?: A Lot Help needed climbing 3-5 steps with a railing? : Total 6 Click Score: 11    End of Session Equipment Utilized During Treatment: Gait belt Activity Tolerance: Patient limited by pain Patient left: in bed;with call bell/phone within reach;with bed alarm set;with SCD's reapplied Nurse Communication: Mobility status PT Visit Diagnosis: Other abnormalities of gait and mobility (R26.89);Difficulty in walking, not elsewhere classified (R26.2);Muscle weakness (generalized) (M62.81);Pain Pain - Right/Left: Right Pain - part of body: Knee     Time: 1660-6301 PT Time Calculation (min) (ACUTE ONLY): 23 min  Charges:    $Therapeutic Exercise: 8-22 mins $Therapeutic Activity: 23-37 mins PT General Charges $$ ACUTE PT VISIT: 1 Visit                    Zadie Cleverly, PTA  Jannet Askew 03/06/2024, 4:58 PM

## 2024-03-06 NOTE — Progress Notes (Signed)
 Physical Therapy Treatment Patient Details Name: Jodi [name] MRN: [id number] DOB: [birth year] Today's Date: 03/06/2024   [name] Pt is an 83 yo female s/p R TKA. PMH of anxiety, GERD, hypothyroidism, L TKA.    PT Comments  Pt received up in chair, received Hydrocodone prior to session. Pt endorses significant increase in R knee pain this date, moaning and crying at rest. Pt educated on recent surgery, benefits of increasing mobility and techniques to calm tension and relax in order to participate in session. Increased assistance needed for bed mobility, transfers and short distance gait in room due to pain and pt crying out. Pt only tolerated ~55ft with RW prior to returning to bed. Unable to attain full R knee extension and very limited tolerance for AA/PROM R knee in supine. Will attempt to increase mobility after lunch with additional pain meds on board if needed.   If plan is discharge home, recommend the following: Two people to help with walking and/or transfers;Two people to help with bathing/dressing/bathroom   Can travel by private vehicle     No  Equipment Recommendations  Rolling walker (2 wheels)    Recommendations for Other Services       Precautions / Restrictions Precautions Precautions: Fall Recall of Precautions/Restrictions: Intact Restrictions Weight Bearing Restrictions Per Provider Order: Yes Other Position/Activity Restrictions:  (R LE WBAT)     Mobility  Bed Mobility Overal bed mobility: Needs Assistance Bed Mobility: Sit to Supine     Supine to sit:  (For B LE's) Sit to supine: Mod assist (for B LE's)   General bed mobility comments: Pt unable to help due to high focus on pain    Transfers Overall transfer level: Needs assistance Equipment used: Rolling walker (2 wheels), None Transfers: Sit to/from Stand Sit to Stand: Mod assist           General transfer comment: Increased assistance needed to stand from chair and BSC  this date due to pain level despite being pre-mendicated    Ambulation/Gait Ambulation/Gait assistance: Contact guard assist, Min assist Gait Distance (Feet): 4 Feet Assistive device: Rolling walker (2 wheels) Gait Pattern/deviations: Step-to pattern, Decreased stance time - right, Antalgic, Trunk flexed Gait velocity: decr     General Gait Details: Decreased tolerance for gait training this date due to increased pain level.   Stairs             Wheelchair Mobility     Tilt Bed    Modified Rankin (Stroke Patients Only)       Balance Overall balance assessment: Needs assistance Sitting-balance support: Feet supported Sitting balance-Leahy Scale: Fair     Standing balance support: Bilateral upper extremity supported, During functional activity, Reliant on assistive device for balance Standing balance-Leahy Scale: Poor Standing balance comment: High fall risk, poor tolerance on accepting wt through R LE                            Communication Communication Communication: No apparent difficulties  Cognition Arousal: Alert Behavior During Therapy: WFL for tasks assessed/performed   PT - Cognitive impairments: No apparent impairments                       PT - Cognition Comments: Pt tearful and moaning, tensing up throughout session requiring Max cues to mobilize Following commands: Intact      Cueing Cueing Techniques: Verbal cues, Tactile cues  Exercises Total  Joint Exercises Ankle Circles/Pumps: AROM, Both, 10 reps General Exercises - Lower Extremity Quad Sets: AROM, Right, 5 reps, Supine Long Arc Quad: AAROM, Right, 10 reps, Seated    General Comments General comments (skin integrity, edema, etc.): R LE ace wrap intact, minimal edema noted      Pertinent Vitals/Pain Pain Assessment Pain Assessment: 0-10 Pain Score: 8  Pain Location: R knee (TKA) Pain Descriptors / Indicators: Aching, Discomfort, Grimacing, Guarding, Moaning,  Operative site guarding Pain Intervention(s): Premedicated before session, Ice applied    Home Living                          Prior Function            PT Goals (current goals can now be found in the care plan section) Acute Rehab PT Goals Patient Stated Goal: to go home    Frequency    BID      PT Plan      Co-evaluation              AM-PAC PT "6 Clicks" Mobility   Outcome Measure  Help needed turning from your back to your side while in a flat bed without using bedrails?: A Lot Help needed moving from lying on your back to sitting on the side of a flat bed without using bedrails?: A Lot Help needed moving to and from a bed to a chair (including a wheelchair)?: A Lot Help needed standing up from a chair using your arms (e.g., wheelchair or bedside chair)?: A Lot Help needed to walk in hospital room?: A Lot Help needed climbing 3-5 steps with a railing? : Total 6 Click Score: 11    End of Session Equipment Utilized During Treatment: Gait belt Activity Tolerance: Patient limited by pain Patient left: in bed;with call bell/phone within reach;with bed alarm set Nurse Communication: Mobility status PT Visit Diagnosis: Other abnormalities of gait and mobility (R26.89);Difficulty in walking, not elsewhere classified (R26.2);Muscle weakness (generalized) (M62.81);Pain Pain - Right/Left: Right Pain - part of body: Knee     Time: 7824-2353 PT Time Calculation (min) (ACUTE ONLY): 26 min  Charges:    $Therapeutic Exercise: 8-22 mins $Therapeutic Activity: 8-22 mins PT General Charges $$ ACUTE PT VISIT: 1 Visit                    Zadie Cleverly, PTA  Jannet Askew 03/06/2024, 1:39 PM

## 2024-03-06 NOTE — Plan of Care (Signed)
   Problem: Education: Goal: Knowledge of General Education information will improve Description: Including pain rating scale, medication(s)/side effects and non-pharmacologic comfort measures Outcome: Progressing   Problem: Clinical Measurements: Goal: Will remain free from infection Outcome: Progressing Goal: Diagnostic test results will improve Outcome: Progressing   Problem: Activity: Goal: Risk for activity intolerance will decrease Outcome: Progressing

## 2024-03-07 MED ORDER — FLEET ENEMA RE ENEM
1.0000 | ENEMA | Freq: Once | RECTAL | Status: AC
  Administered 2024-03-07: 1 via RECTAL

## 2024-03-07 MED ORDER — GLYCERIN (LAXATIVE) 2 G RE SUPP
1.0000 | Freq: Once | RECTAL | Status: AC
  Administered 2024-03-07: 1 via RECTAL
  Filled 2024-03-07: qty 1

## 2024-03-07 MED ORDER — SODIUM CHLORIDE 0.9 % IV BOLUS
250.0000 mL | Freq: Once | INTRAVENOUS | Status: AC
  Administered 2024-03-07: 250 mL via INTRAVENOUS

## 2024-03-07 NOTE — Progress Notes (Signed)
   Subjective: 3 Days Post-Op Procedure(s) (LRB): ARTHROPLASTY, KNEE, TOTAL (Right) Patient reports pain as moderate, increased from yesterday  Patient is well, and has had no acute complaints or problems Denies any CP, SOB, ABD pain. No BM, passing flatus We will continue therapy today.  Plan is to go Home vs SNF after hospital stay.  Objective: Vital signs in last 24 hours: Temp:  [97.5 F (36.4 C)-98.7 F (37.1 C)] 98 F (36.7 C) (03/16 0904) Pulse Rate:  [80-90] 90 (03/16 0904) Resp:  [18-20] 20 (03/16 0904) BP: (111-133)/(56-66) 111/66 (03/16 0904) SpO2:  [92 %-98 %] 92 % (03/16 0904)  Intake/Output from previous day: 03/15 0701 - 03/16 0700 In: 600 [P.O.:600] Out: 4 [Urine:4] Intake/Output this shift: No intake/output data recorded.  Recent Labs    03/05/24 0519 03/06/24 0158  HGB 12.7 12.7   Recent Labs    03/05/24 0519 03/06/24 0158  WBC 12.8* 10.5  RBC 4.04 4.14  HCT 38.1 39.0  PLT 254 216   Recent Labs    03/05/24 0519  NA 138  K 4.3  CL 104  CO2 23  BUN 21  CREATININE 1.06*  GLUCOSE 119*  CALCIUM 8.8*   No results for input(s): "LABPT", "INR" in the last 72 hours.  EXAM General - Patient is Alert, Appropriate, and Oriented Extremity - Neurovascular intact Sensation intact distally Intact pulses distally Dressing - dressing C/D/I and no drainage Motor Function - intact, moving foot and toes well on exam.   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   Weight as of this encounter: 84.4 kg. Advance diet Up with therapy Pain better under control today, patient receiving pain meds with provider visit BP soft/stable and patient not taking in PO fluids well per Nursing, given 250 cc bolus of IV fluids. Encouraged PO fluids with patient Monitor progress with PT  Given patient's level of discomfort and overall function with PT thus far, discussed with the patient that it may be safer to get her to a SNF for rehabilitation post-op. TOC referral placed  Patient still has not had a BM, has been passing flatus. Will look to give a suppository (+/- enema) later today if no BM  DVT Prophylaxis - Lovenox, TED hose, and SCDs Weight-Bearing as tolerated to right leg   [name], PA-C [facility] 03/07/2024, 10:30 AM

## 2024-03-07 NOTE — Plan of Care (Signed)
  Problem: Education: Goal: Knowledge of General Education information will improve Description: Including pain rating scale, medication(s)/side effects and non-pharmacologic comfort measures Outcome: Progressing   Problem: Clinical Measurements: Goal: Ability to maintain clinical measurements within normal limits will improve Outcome: Progressing   Problem: Nutrition: Goal: Adequate nutrition will be maintained Outcome: Progressing   Problem: Coping: Goal: Level of anxiety will decrease Outcome: Progressing   Problem: Pain Managment: Goal: General experience of comfort will improve and/or be controlled Outcome: Progressing   Problem: Safety: Goal: Ability to remain free from injury will improve Outcome: Progressing   Problem: Pain Management: Goal: Pain level will decrease with appropriate interventions Outcome: Progressing   Problem: Skin Integrity: Goal: Will show signs of wound healing Outcome: Progressing

## 2024-03-07 NOTE — Progress Notes (Signed)
 Physical Therapy Treatment Patient Details Name: Jodi [name] MRN: [id number] DOB: Sep 07, 1941 Today's Date: 03/07/2024   [name] Pt is an 83 yo female s/p R TKA. PMH of anxiety, GERD, hypothyroidism, L TKA.    PT Comments  Up in chair.  Participated in exercises as described below.  She stands with mod a x 1 and cues for hand placements.  She is able to walk 6' x 2 with RW and slow gait with cues and assist to manage walker.  She is able to void on BSC but no BM noted.  Some nausea and remains up with encouragement for breakfast.   If plan is discharge home, recommend the following: A lot of help with walking and/or transfers;A lot of help with bathing/dressing/bathroom   Can travel by private vehicle     No  Equipment Recommendations  Rolling walker (2 wheels)    Recommendations for Other Services       Precautions / Restrictions Precautions Precautions: Fall Recall of Precautions/Restrictions: Intact Restrictions Weight Bearing Restrictions Per Provider Order: Yes Other Position/Activity Restrictions: WBAT R LE     Mobility  Bed Mobility               General bed mobility comments: in recliner before and after Patient Response: Cooperative  Transfers Overall transfer level: Needs assistance Equipment used: Rolling walker (2 wheels) Transfers: Sit to/from Stand Sit to Stand: Mod assist                Ambulation/Gait Ambulation/Gait assistance: Min assist Gait Distance (Feet): 6 Feet Assistive device: Rolling walker (2 wheels) Gait Pattern/deviations: Step-to pattern, Decreased stance time - right, Antalgic, Trunk flexed Gait velocity: decr     General Gait Details: slow gait with heavy cues   Stairs             Wheelchair Mobility     Tilt Bed Tilt Bed Patient Response: Cooperative  Modified Rankin (Stroke Patients Only)       Balance Overall balance assessment: Needs assistance Sitting-balance support:  Feet supported Sitting balance-Leahy Scale: Fair     Standing balance support: Bilateral upper extremity supported, During functional activity, Reliant on assistive device for balance Standing balance-Leahy Scale: Poor Standing balance comment: High fall risk, poor tolerance on accepting wt through R LE                            Communication Communication Communication: No apparent difficulties  Cognition Arousal: Alert Behavior During Therapy: WFL for tasks assessed/performed   PT - Cognitive impairments: Safety/Judgement, Problem solving                         Following commands: Intact      Cueing Cueing Techniques: Verbal cues, Tactile cues  Exercises Total Joint Exercises Goniometric ROM: 5-80 - limited by pain Other Exercises Other Exercises: supine and seated AAROM RLE x 10    General Comments        Pertinent Vitals/Pain Pain Assessment Pain Assessment: Faces Faces Pain Scale: Hurts whole lot Pain Location: R knee (TKA) Pain Descriptors / Indicators: Aching, Discomfort, Grimacing, Guarding, Moaning, Operative site guarding Pain Intervention(s): Limited activity within patient's tolerance, Monitored during session, Premedicated before session, Repositioned, Ice applied    Home Living  Prior Function            PT Goals (current goals can now be found in the care plan section) Progress towards PT goals: Progressing toward goals    Frequency    BID      PT Plan      Co-evaluation              AM-PAC PT "6 Clicks" Mobility   Outcome Measure  Help needed turning from your back to your side while in a flat bed without using bedrails?: A Lot Help needed moving from lying on your back to sitting on the side of a flat bed without using bedrails?: A Lot Help needed moving to and from a bed to a chair (including a wheelchair)?: A Lot Help needed standing up from a chair using your arms  (e.g., wheelchair or bedside chair)?: A Lot Help needed to walk in hospital room?: A Little Help needed climbing 3-5 steps with a railing? : Total 6 Click Score: 12    End of Session Equipment Utilized During Treatment: Gait belt Activity Tolerance: Patient limited by pain Patient left: with call bell/phone within reach;in chair;with chair alarm set Nurse Communication: Mobility status PT Visit Diagnosis: Other abnormalities of gait and mobility (R26.89);Difficulty in walking, not elsewhere classified (R26.2);Muscle weakness (generalized) (M62.81);Pain Pain - Right/Left: Right Pain - part of body: Knee     Time: 5784-6962 PT Time Calculation (min) (ACUTE ONLY): 26 min  Charges:    $Gait Training: 8-22 mins PT General Charges $$ ACUTE PT VISIT: 1 Visit                   Danielle Dess, PTA 03/07/24, 9:46 AM

## 2024-03-08 NOTE — Progress Notes (Signed)
 Occupational Therapy Treatment Patient Details Name: Jodi [name] MRN: [id number] DOB: [birth year] Today's Date: 03/08/2024   [name] Pt is an 83 yo female s/p R TKA. PMH of anxiety, GERD, hypothyroidism, L TKA.   OT comments  Jodi [name] was seen for OT treatment on this date. Upon arrival to room pt in bed, agreeable to tx. Pt requires MOD A bed mobility. MIN A sit<>stand and standing grooming tasks. MAX A for LBD in sitting. Pt making good progress toward goals, will continue to follow POC. Discharge recommendation remains appropriate.        If plan is discharge home, recommend the following:  Two people to help with walking and/or transfers;A lot of help with bathing/dressing/bathroom;Assistance with cooking/housework;Direct supervision/assist for financial management;Direct supervision/assist for medications management;Assist for transportation;Help with stairs or ramp for entrance;Supervision due to cognitive status   Equipment Recommendations  None recommended by OT    Recommendations for Other Services      Precautions / Restrictions Precautions Precautions: Fall Recall of Precautions/Restrictions: Intact Restrictions Weight Bearing Restrictions Per Provider Order: Yes RLE Weight Bearing Per Provider Order: Weight bearing as tolerated       Mobility Bed Mobility Overal bed mobility: Needs Assistance Bed Mobility: Supine to Sit, Sit to Supine     Supine to sit: Mod assist Sit to supine: Mod assist        Transfers Overall transfer level: Needs assistance Equipment used: Rolling walker (2 wheels) Transfers: Sit to/from Stand Sit to Stand: Min assist                 Balance Overall balance assessment: Needs assistance Sitting-balance support: Feet supported Sitting balance-Leahy Scale: Good     Standing balance support: No upper extremity supported, During functional activity Standing balance-Leahy Scale: Fair                              ADL either performed or assessed with clinical judgement   ADL Overall ADL's : Needs assistance/impaired                                       General ADL Comments: MIN A standing grooming tasks. MAX A for LBD in sitting     Communication Communication Communication: No apparent difficulties   Cognition Arousal: Alert Behavior During Therapy: WFL for tasks assessed/performed Cognition: History of cognitive impairments                               Following commands: Intact                      Pertinent Vitals/ Pain       Pain Assessment Pain Assessment: 0-10 Pain Score: 5  Pain Location: R knee (TKA) Pain Descriptors / Indicators: Aching, Grimacing, Guarding, Moaning, Operative site guarding Pain Intervention(s): Limited activity within patient's tolerance, Repositioned   Frequency  Min 2X/week        Progress Toward Goals  OT Goals(current goals can now be found in the care plan section)  Progress towards OT goals: Progressing toward goals  Acute Rehab OT Goals OT Goal Formulation: With patient Time For Goal Achievement: 03/19/24 Potential to Achieve Goals: Good ADL Goals Pt Will Perform Grooming: with supervision;standing Pt Will Perform Lower Body Dressing:  with adaptive equipment;sit to/from stand;with min assist Pt Will Transfer to Toilet: with modified independence;bedside commode;ambulating Pt Will Perform Toileting - Clothing Manipulation and hygiene: with modified independence;sit to/from stand  Plan      Co-evaluation                 AM-PAC OT "6 Clicks" Daily Activity     Outcome Measure   Help from another person eating meals?: None Help from another person taking care of personal grooming?: A Little Help from another person toileting, which includes using toliet, bedpan, or urinal?: A Lot Help from another person bathing (including washing, rinsing, drying)?: A Lot Help from  another person to put on and taking off regular upper body clothing?: A Little Help from another person to put on and taking off regular lower body clothing?: A Lot 6 Click Score: 16    End of Session    OT Visit Diagnosis: Unsteadiness on feet (R26.81)   Activity Tolerance Patient tolerated treatment well;Patient limited by pain   Patient Left in bed;with call bell/phone within reach;with bed alarm set   Nurse Communication          Time: 1535-1550 OT Time Calculation (min): 15 min  Charges: OT General Charges $OT Visit: 1 Visit OT Treatments $Self Care/Home Management : 8-22 mins  Kathie Dike, M.S. OTR/L  03/08/24, 4:06 PM  ascom [phone]

## 2024-03-08 NOTE — TOC Initial Note (Signed)
 Transition of Care [facility]) - Initial/Assessment Note    Patient Details  Name: Jodi [name] MRN: [id number] Date of Birth: May 28, 1941  [facility]) CM/SW Contact:    Marlowe Sax, RN Phone Number: 03/08/2024, 4:02 PM  Clinical Narrative:                  Met with the patient and discussed DC plan and needs She is agreeable to a bed search and will review offers once obtained and make choice Fl2 completed, Bedsearch sent, PASSR obtained       Patient Goals and CMS Choice            Expected Discharge Plan and Services                                              Prior Living Arrangements/Services                       Activities of Daily Living   ADL Screening (condition at time of admission) Independently performs ADLs?: Yes (appropriate for developmental age) Is the patient deaf or have difficulty hearing?: Yes (states "sometimes has difficulty hearing") Does the patient have difficulty seeing, even when wearing glasses/contacts?: No Does the patient have difficulty concentrating, remembering, or making decisions?: Yes (States has "difficulty remembering sometimes")  Permission Sought/Granted                  Emotional Assessment              Admission diagnosis:  S/P total knee arthroplasty, right [Z96.651] Patient Active Problem List   Diagnosis Date Noted   S/P total knee arthroplasty, right 03/04/2024   S/P TKR (total knee replacement) using cement, 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 [place] [address] Phone: [phone] Fax: [phone]     Social Drivers of Health (SDOH) Social History: SDOH Screenings   Food  Insecurity: No Food Insecurity (03/04/2024)  Housing: Low Risk  (03/04/2024)  Transportation Needs: No Transportation Needs (03/04/2024)  Utilities: Not At Risk (03/04/2024)  Financial Resource Strain: Low Risk  (09/26/2023)   Received from [facility]  Social Connections: Moderately Isolated (03/04/2024)  Tobacco Use: Low Risk  (03/04/2024)  Recent Concern: Tobacco Use - Medium Risk (02/25/2024)   Received from [facility]   SDOH Interventions:     Readmission Risk Interventions     No data to display

## 2024-03-08 NOTE — NC FL2 (Signed)
 [place] MEDICAID FL2 LEVEL OF CARE FORM     IDENTIFICATION  Patient Name: Jodi [name] Birthdate: 10-09-1941 Sex: female Admission Date (Current Location): 03/04/2024  [facility] and [place] Number:  Chiropodist and Address:  [facility], [address] [place], [place] [address]      Provider Number: [id number]  Attending Physician Name and Address:  Reinaldo Berber, MD  Relative Name and Phone Number:  Poet Hineman  [facility]  Emergency Contact  [phone]  [address]  [place] [address]    Current Level of Care: Hospital Recommended Level of Care: Skilled Nursing Facility Prior Approval Number:    Date Approved/Denied:   PASRR Number: [id number]  Discharge Plan: SNF    Current Diagnoses: Patient Active Problem List   Diagnosis Date Noted   S/P total knee arthroplasty, right 03/04/2024   S/P TKR (total knee replacement) using cement, left 07/04/2022   DIVERTICULITIS, COLON 08/15/2009   Acute on chronic cholecystitis 08/14/2009   LIVER FUNCTION TESTS, ABNORMAL, HX OF 08/14/2009   ABDOMINAL PAIN, RIGHT UPPER QUADRANT, HX OF 08/14/2009    Orientation RESPIRATION BLADDER Height & Weight     Self, Time, Situation, Place  Normal Continent, External catheter Weight: 84.4 kg Height:  5\' 2"  (157.5 cm)  BEHAVIORAL SYMPTOMS/MOOD NEUROLOGICAL BOWEL NUTRITION STATUS      Continent Diet (regular)  AMBULATORY STATUS COMMUNICATION OF NEEDS Skin   Extensive Assist Verbally Normal, Surgical wounds                       Personal Care Assistance Level of Assistance  Bathing, Feeding, Dressing Bathing Assistance: Maximum assistance Feeding assistance: Limited assistance Dressing Assistance: Maximum assistance     Functional Limitations Info  Sight, Hearing, Speech   Hearing Info: Adequate Speech Info: Adequate    SPECIAL CARE FACTORS FREQUENCY  PT (By licensed PT), OT (By licensed OT)     PT Frequency: 5 times per  week OT Frequency: 5 times per week            Contractures Contractures Info: Not present    Additional Factors Info  Code Status, Allergies Code Status Info: full code Allergies Info: lyrica, Oxycodone           Current Medications (03/08/2024):  This is the current hospital active medication list Current Facility-Administered Medications  Medication Dose Route Frequency Provider Last Rate Last Admin   0.9 %  sodium chloride infusion   Intravenous Continuous Reinaldo Berber, MD 100 mL/hr at 03/05/24 0400 Infusion Verify at 03/05/24 0400   acetaminophen (TYLENOL) tablet 325-650 mg  325-650 mg Oral Q6H PRN Reinaldo Berber, MD       bisacodyl (DULCOLAX) suppository 10 mg  10 mg Rectal Daily PRN Evon Slack, PA-C       cyclobenzaprine (FLEXERIL) tablet 10 mg  10 mg Oral QHS Reinaldo Berber, MD   10 mg at 03/07/24 2243   cycloSPORINE (RESTASIS) 0.05 % ophthalmic emulsion 1 drop  1 drop Both Eyes BID Reinaldo Berber, MD   1 drop at 03/08/24 9528   docusate sodium (COLACE) capsule 100 mg  100 mg Oral BID Reinaldo Berber, MD   100 mg at 03/08/24 1002   enoxaparin (LOVENOX) injection 30 mg  30 mg Subcutaneous Q12H Reinaldo Berber, MD   30 mg at 03/08/24 4132   gabapentin (NEURONTIN) capsule 600 mg  600 mg Oral QHS Reinaldo Berber, MD   600 mg at 03/07/24 2243  HYDROcodone-acetaminophen (NORCO/VICODIN) 5-325 MG per tablet 1-2 tablet  1-2 tablet Oral Q4H PRN Reinaldo Berber, MD   2 tablet at 03/08/24 1222   levothyroxine (SYNTHROID) tablet 100 mcg  100 mcg Oral QAC breakfast Reinaldo Berber, MD   100 mcg at 03/08/24 0551   magnesium hydroxide (MILK OF MAGNESIA) suspension 30 mL  30 mL Oral Daily PRN Evon Slack, PA-C   30 mL at 03/08/24 1018   magnesium oxide (MAG-OX) tablet 400 mg  400 mg Oral Daily Reinaldo Berber, MD   400 mg at 03/08/24 1001   menthol-cetylpyridinium (CEPACOL) lozenge 3 mg  1 lozenge Oral PRN Reinaldo Berber, MD       Or   phenol (CHLORASEPTIC)  mouth spray 1 spray  1 spray Mouth/Throat PRN Reinaldo Berber, MD       metoCLOPramide (REGLAN) tablet 5-10 mg  5-10 mg Oral Q8H PRN Reinaldo Berber, MD       Or   metoCLOPramide (REGLAN) injection 5-10 mg  5-10 mg Intravenous Q8H PRN Reinaldo Berber, MD       morphine (PF) 2 MG/ML injection 0.5-1 mg  0.5-1 mg Intravenous Q2H PRN Reinaldo Berber, MD   1 mg at 03/08/24 0654   ondansetron (ZOFRAN) tablet 4 mg  4 mg Oral Q6H PRN Reinaldo Berber, MD       Or   ondansetron (ZOFRAN) injection 4 mg  4 mg Intravenous Q6H PRN Reinaldo Berber, MD       pantoprazole (PROTONIX) EC tablet 40 mg  40 mg Oral Daily Reinaldo Berber, MD   40 mg at 03/08/24 1002   traMADol (ULTRAM) tablet 50 mg  50 mg Oral Q6H PRN Reinaldo Berber, MD   50 mg at 03/08/24 1002     Discharge Medications: Please see discharge summary for a list of discharge medications.  Relevant Imaging Results:  Relevant Lab Results:   Additional Information SS# [id number]  Marlowe Sax, RN

## 2024-03-08 NOTE — Plan of Care (Signed)

## 2024-03-08 NOTE — Plan of Care (Signed)
   Problem: Education: Goal: Knowledge of General Education information will improve Description: Including pain rating scale, medication(s)/side effects and non-pharmacologic comfort measures Outcome: Progressing   Problem: Clinical Measurements: Goal: Will remain free from infection Outcome: Progressing

## 2024-03-08 NOTE — Progress Notes (Signed)
   Subjective: 4 Days Post-Op Procedure(s) (LRB): ARTHROPLASTY, KNEE, TOTAL (Right) Patient reports pain as mild.  Husband at bedside Patient is well, and has had no acute complaints or problems Denies any CP, SOB, ABD pain. We will continue therapy today.  Plan is to go Skilled nursing facility after hospital stay.  Objective: Vital signs in last 24 hours: Temp:  [97.8 F (36.6 C)-99.1 F (37.3 C)] 97.8 F (36.6 C) (03/17 0801) Pulse Rate:  [77-86] 77 (03/17 0801) Resp:  [16-18] 16 (03/17 0801) BP: (117-127)/(60-91) 127/63 (03/17 0801) SpO2:  [91 %-96 %] 96 % (03/17 0801)  Intake/Output from previous day: 03/16 0701 - 03/17 0700 In: 611.1 [P.O.:360; IV Piggyback:251.1] Out: -  Intake/Output this shift: No intake/output data recorded.  Recent Labs    03/06/24 0158  HGB 12.7   Recent Labs    03/06/24 0158  WBC 10.5  RBC 4.14  HCT 39.0  PLT 216   No results for input(s): "NA", "K", "CL", "CO2", "BUN", "CREATININE", "GLUCOSE", "CALCIUM" in the last 72 hours.  No results for input(s): "LABPT", "INR" in the last 72 hours.  EXAM General - Patient is Alert, Appropriate, and Oriented Extremity - Neurovascular intact Sensation intact distally Intact pulses distally Dressing - dressing C/D/I and no drainage Motor Function - intact, moving foot and toes well on exam.   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   Weight as of this encounter: 84.4 kg. Advance diet Up with therapy Pain controlled VSS CM to assist with discharge to SNF  DVT Prophylaxis - Lovenox, TED hose, and SCDs Weight-Bearing as tolerated to right leg   T. Cranston Neighbor, PA-C [facility] 03/08/2024, 10:20 AM

## 2024-03-08 NOTE — Progress Notes (Signed)
 Physical Therapy Treatment Patient Details Name: Jodi [name] MRN: [id number] DOB: Apr 26, 1941 Today's Date: 03/08/2024   [name] Pt is an 83 yo female s/p R TKA. PMH of anxiety, GERD, hypothyroidism, L TKA.    PT Comments  Pt anxious and somewhat self-limiting due to R knee pain despite being pre-medicated.  As the session progressed, however, the pt's anxiety improved and she was less pain limited with bed mobility movements at the end of the session than in the prior session.  Pt was able to perform below therex with slow, cautious movements that were mostly low amplitude and took several antalgic steps form chair to bed but was unable to ambulate further.  Pt reported no adverse symptoms during the session other than R knee pain and was in no apparent distress at the end of the session. Pt will benefit from continued PT services upon discharge to safely address deficits listed in patient problem list for decreased caregiver assistance and eventual return to PLOF.      If plan is discharge home, recommend the following: A lot of help with walking and/or transfers;A lot of help with bathing/dressing/bathroom;Help with stairs or ramp for entrance;Assist for transportation;Assistance with cooking/housework   Can travel by private vehicle     No  Equipment Recommendations  Rolling walker (2 wheels)    Recommendations for Other Services       Precautions / Restrictions Precautions Precautions: Fall Recall of Precautions/Restrictions: Intact Restrictions Weight Bearing Restrictions Per Provider Order: Yes RLE Weight Bearing Per Provider Order: Weight bearing as tolerated     Mobility  Bed Mobility Overal bed mobility: Needs Assistance Bed Mobility: Sit to Supine     Supine to sit: +2 for physical assistance, Mod assist Sit to supine: Min assist   General bed mobility comments: Min A for RLE management with min cues for use of bed rail to assist     Transfers Overall transfer level: Needs assistance Equipment used: Rolling walker (2 wheels) Transfers: Sit to/from Stand Sit to Stand: Min assist           General transfer comment: Mod verbal cues for hand and R foot placement and to encourage use of the RLE to assist with coming to standing    Ambulation/Gait Ambulation/Gait assistance: Contact guard assist Gait Distance (Feet): 3 Feet Assistive device: Rolling walker (2 wheels) Gait Pattern/deviations: Step-to pattern, Decreased stance time - right, Antalgic, Trunk flexed, Decreased step length - left Gait velocity: decreased     General Gait Details: Mod verbal cues for step-to sequencing for pain control   Stairs             Wheelchair Mobility     Tilt Bed    Modified Rankin (Stroke Patients Only)       Balance Overall balance assessment: Needs assistance Sitting-balance support: Feet supported Sitting balance-Leahy Scale: Good     Standing balance support: Bilateral upper extremity supported, During functional activity, Reliant on assistive device for balance Standing balance-Leahy Scale: Fair                              Musician Communication: No apparent difficulties  Cognition Arousal: Alert Behavior During Therapy: WFL for tasks assessed/performed                             Following commands: Intact  Cueing Cueing Techniques: Verbal cues, Tactile cues, Visual cues  Exercises Total Joint Exercises Ankle Circles/Pumps: AROM, Strengthening, Both, 10 reps Quad Sets: AROM, Strengthening, Right, 10 reps Hip ABduction/ADduction: AAROM, Strengthening, Right, 5 reps Straight Leg Raises: AAROM, Strengthening, Right, 5 reps Long Arc Quad: AROM, Strengthening, Right, 10 reps, 5 reps Knee Flexion: AROM, Strengthening, Right, 10 reps, 5 reps Goniometric ROM: R knee AROM: 21-73 deg, very limited by pain despite being pre-medicated Other  Exercises Other Exercises: Pt education provided on physiological benefits of activity and of mobilizing R knee for progress towards long term ROM goals Other Exercises: HEP education per handout with focus on RLE QS and seated knee flex Other Exercises: Positioning education to promote R knee ext PROM    General Comments        Pertinent Vitals/Pain Pain Assessment Pain Assessment: 0-10 Pain Score: 10-Worst pain ever Pain Location: R knee (TKA) Pain Descriptors / Indicators: Aching, Grimacing, Guarding, Moaning, Operative site guarding Pain Intervention(s): Repositioned, Premedicated before session, Ice applied, Monitored during session, Patient requesting pain meds-RN notified    Home Living                          Prior Function            PT Goals (current goals can now be found in the care plan section) Progress towards PT goals: PT to reassess next treatment    Frequency    BID      PT Plan      Co-evaluation              AM-PAC PT "6 Clicks" Mobility   Outcome Measure  Help needed turning from your back to your side while in a flat bed without using bedrails?: A Lot Help needed moving from lying on your back to sitting on the side of a flat bed without using bedrails?: A Lot Help needed moving to and from a bed to a chair (including a wheelchair)?: A Lot Help needed standing up from a chair using your arms (e.g., wheelchair or bedside chair)?: A Lot Help needed to walk in hospital room?: A Lot Help needed climbing 3-5 steps with a railing? : Total 6 Click Score: 11    End of Session Equipment Utilized During Treatment: Gait belt Activity Tolerance: Patient limited by pain Patient left: in bed;with call bell/phone within reach;with bed alarm set;with SCD's reapplied;Other (comment) (polar care donned to R knee) Nurse Communication: Mobility status PT Visit Diagnosis: Other abnormalities of gait and mobility (R26.89);Difficulty in walking,  not elsewhere classified (R26.2);Muscle weakness (generalized) (M62.81);Pain Pain - Right/Left: Right Pain - part of body: Knee     Time: 1310-1334 PT Time Calculation (min) (ACUTE ONLY): 24 min  Charges:    $Therapeutic Exercise: 8-22 mins $Therapeutic Activity: 8-22 mins PT General Charges $$ ACUTE PT VISIT: 1 Visit                     D. Elly Modena PT, DPT 03/08/24, 2:31 PM

## 2024-03-08 NOTE — Progress Notes (Signed)
 Physical Therapy Treatment Patient Details Name: Jodi [name] MRN: [id number] DOB: 05/13/1941 Today's Date: 03/08/2024   [name] Pt is an 83 yo female s/p R TKA. PMH of anxiety, GERD, hypothyroidism, L TKA.    PT Comments  Despite being pre-medicated prior to session pt was quite anxious and limited by R knee pain.  Pt required total assist to manage her RLE during sup to sit and once in sitting at the EOB needed several minutes before she could gently perform seated LAQs and knee flex.  Pt ultimately was able to come to standing from an elevated EOB and to take several small, antalgic steps at the EOB and then to the chair before needing to return to sitting for pain control.  Pt will benefit from continued PT services upon discharge to safely address deficits listed in patient problem list for decreased caregiver assistance and eventual return to PLOF.     If plan is discharge home, recommend the following: A lot of help with walking and/or transfers;A lot of help with bathing/dressing/bathroom;Help with stairs or ramp for entrance;Assist for transportation;Assistance with cooking/housework   Can travel by private vehicle     No  Equipment Recommendations  Rolling walker (2 wheels)    Recommendations for Other Services       Precautions / Restrictions Precautions Precautions: Fall Recall of Precautions/Restrictions: Intact Restrictions Weight Bearing Restrictions Per Provider Order: Yes RLE Weight Bearing Per Provider Order: Weight bearing as tolerated     Mobility  Bed Mobility Overal bed mobility: Needs Assistance Bed Mobility: Supine to Sit     Supine to sit: +2 for physical assistance, Mod assist     General bed mobility comments: +2 Mod A this session secondary to R knee pain level being so high with one person providing total assist to manage her RLE and the other providing mod A for trunk control    Transfers Overall transfer level: Needs  assistance Equipment used: Rolling walker (2 wheels) Transfers: Sit to/from Stand Sit to Stand: From elevated surface, Mod assist           General transfer comment: Mod verbal cues for hand and R foot placement and to encourage use of the RLE to assist with coming to standing    Ambulation/Gait Ambulation/Gait assistance: Contact guard assist Gait Distance (Feet): 3 Feet Assistive device: Rolling walker (2 wheels) Gait Pattern/deviations: Step-to pattern, Decreased stance time - right, Antalgic, Trunk flexed, Decreased step length - left Gait velocity: decreased     General Gait Details: Mod verbal cues for step-to sequencing for pain control   Stairs             Wheelchair Mobility     Tilt Bed    Modified Rankin (Stroke Patients Only)       Balance Overall balance assessment: Needs assistance Sitting-balance support: Feet supported Sitting balance-Leahy Scale: Good     Standing balance support: Bilateral upper extremity supported, During functional activity, Reliant on assistive device for balance Standing balance-Leahy Scale: Fair                              Musician Communication: No apparent difficulties  Cognition Arousal: Alert Behavior During Therapy: WFL for tasks assessed/performed                             Following commands: Intact  Cueing Cueing Techniques: Verbal cues, Tactile cues  Exercises Total Joint Exercises Ankle Circles/Pumps: AROM, Strengthening, Both, 10 reps Quad Sets: AROM, Strengthening, Right, 10 reps Hip ABduction/ADduction: AAROM, Strengthening, Right, 5 reps Straight Leg Raises: AAROM, Strengthening, Right, 5 reps Long Arc Quad: AROM, Strengthening, Right, 10 reps Knee Flexion: AROM, Strengthening, Right, 10 reps Goniometric ROM: R knee AROM: 21-73 deg, very limited by pain despite being pre-medicated Other Exercises Other Exercises: Pt education provided on  physiological benefits of activity Other Exercises: HEP education per handout with focus on RLE QS and seated knee flex Other Exercises: Positioning education to promote R knee ext PROM    General Comments        Pertinent Vitals/Pain Pain Assessment Pain Assessment: 0-10 Pain Score: 10-Worst pain ever Pain Location: R knee (TKA) Pain Descriptors / Indicators: Aching, Grimacing, Guarding, Moaning, Operative site guarding Pain Intervention(s): Repositioned, Ice applied, Premedicated before session, Monitored during session, Patient requesting pain meds-RN notified    Home Living                          Prior Function            PT Goals (current goals can now be found in the care plan section) Progress towards PT goals: Not progressing toward goals - comment (limited by R knee pain)    Frequency    BID      PT Plan      Co-evaluation              AM-PAC PT "6 Clicks" Mobility   Outcome Measure  Help needed turning from your back to your side while in a flat bed without using bedrails?: A Lot Help needed moving from lying on your back to sitting on the side of a flat bed without using bedrails?: A Lot Help needed moving to and from a bed to a chair (including a wheelchair)?: A Lot Help needed standing up from a chair using your arms (e.g., wheelchair or bedside chair)?: A Lot Help needed to walk in hospital room?: A Lot Help needed climbing 3-5 steps with a railing? : Total 6 Click Score: 11    End of Session Equipment Utilized During Treatment: Gait belt Activity Tolerance: Patient limited by pain Patient left: in chair;with call bell/phone within reach;with chair alarm set;with SCD's reapplied;Other (comment) (Polar care donned to R knee) Nurse Communication: Mobility status;Patient requests pain meds PT Visit Diagnosis: Other abnormalities of gait and mobility (R26.89);Difficulty in walking, not elsewhere classified (R26.2);Muscle weakness  (generalized) (M62.81);Pain Pain - Right/Left: Right Pain - part of body: Knee     Time: 1103-1130 PT Time Calculation (min) (ACUTE ONLY): 27 min  Charges:    $Therapeutic Exercise: 8-22 mins $Therapeutic Activity: 8-22 mins PT General Charges $$ ACUTE PT VISIT: 1 Visit                     D. Scott Turki Tapanes PT, DPT 03/08/24, 11:51 AM

## 2024-03-09 NOTE — Plan of Care (Signed)
  Problem: Education: Goal: Knowledge of General Education information will improve Description: Including pain rating scale, medication(s)/side effects and non-pharmacologic comfort measures Outcome: Progressing   Problem: Health Behavior/Discharge Planning: Goal: Ability to manage health-related needs will improve Outcome: Progressing   Problem: Clinical Measurements: Goal: Ability to maintain clinical measurements within normal limits will improve Outcome: Progressing Goal: Respiratory complications will improve Outcome: Progressing   Problem: Activity: Goal: Risk for activity intolerance will decrease Outcome: Progressing   Problem: Coping: Goal: Level of anxiety will decrease Outcome: Progressing   Problem: Elimination: Goal: Will not experience complications related to bowel motility Outcome: Progressing

## 2024-03-09 NOTE — TOC Progression Note (Signed)
 Transition of Care [facility]) - Progression Note    Patient Details  Name: Jodi [name] MRN: [id number] Date of Birth: 01/31/1941  Transition of Care [facility]) CM/SW Contact  Marlowe Sax, RN Phone Number: 03/09/2024, 12:23 PM  Clinical Narrative:      Met with the patient and reviewed the bed offers, she stated that she has been to [facility] before and accepted the bed offer from them, I notified Sue Lush at [facility]      Expected Discharge Plan and Services                                               Social Determinants of Health (SDOH) Interventions SDOH Screenings   Food Insecurity: No Food Insecurity (03/04/2024)  Housing: Low Risk  (03/04/2024)  Transportation Needs: No Transportation Needs (03/04/2024)  Utilities: Not At Risk (03/04/2024)  Financial Resource Strain: Low Risk  (09/26/2023)   Received from [facility]  Social Connections: Moderately Isolated (03/04/2024)  Tobacco Use: Low Risk  (03/04/2024)  Recent Concern: Tobacco Use - Medium Risk (02/25/2024)   Received from [facility]    Readmission Risk Interventions     No data to display

## 2024-03-09 NOTE — Progress Notes (Signed)
   Subjective: 5 Days Post-Op Procedure(s) (LRB): ARTHROPLASTY, KNEE, TOTAL (Right) Patient reports pain as mild.   Patient is well, and has had no acute complaints or problems Denies any CP, SOB, ABD pain. +BM We will continue therapy today.  Plan is to go Skilled nursing facility after hospital stay.  Objective: Vital signs in last 24 hours: Temp:  [97.6 F (36.4 C)-98 F (36.7 C)] 97.9 F (36.6 C) (03/18 0706) Pulse Rate:  [77-88] 77 (03/18 0706) Resp:  [16-18] 18 (03/18 0706) BP: (108-136)/(49-72) 122/72 (03/18 0706) SpO2:  [96 %-98 %] 98 % (03/18 0706)  Intake/Output from previous day: No intake/output data recorded. Intake/Output this shift: No intake/output data recorded.  No results for input(s): "HGB" in the last 72 hours.  No results for input(s): "WBC", "RBC", "HCT", "PLT" in the last 72 hours.  No results for input(s): "NA", "K", "CL", "CO2", "BUN", "CREATININE", "GLUCOSE", "CALCIUM" in the last 72 hours.  No results for input(s): "LABPT", "INR" in the last 72 hours.  EXAM General - Patient is Alert, Appropriate, and Oriented Extremity - Neurovascular intact Sensation intact distally Intact pulses distally Dressing - dressing C/D/I and no drainage Motor Function - intact, moving foot and toes well on exam.   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   Weight as of this encounter: 84.4 kg. Advance diet Up with therapy Pain controlled VSS +BM CM to assist with discharge to SNF  DVT Prophylaxis - Lovenox, TED hose, and SCDs Weight-Bearing as tolerated to right leg   T. Cranston Neighbor, PA-C [facility] 03/09/2024, 8:20 AM

## 2024-03-09 NOTE — TOC Progression Note (Signed)
 Transition of Care [facility]) - Progression Note    Patient Details  Name: Jodi [name] MRN: [id number] Date of Birth: 1941/03/31  Transition of Care [facility]) CM/SW Contact  Marlowe Sax, RN Phone Number: 03/09/2024, 2:41 PM  Clinical Narrative:     Spoke with Daughter Misty Stanley and let her know that the patient will go to [facility] to room 114 today, EMS to transport I called [facility] to arrange        Expected Discharge Plan and Services         Expected Discharge Date: 03/09/24                                     Social Determinants of Health (SDOH) Interventions SDOH Screenings   Food Insecurity: No Food Insecurity (03/04/2024)  Housing: Low Risk  (03/04/2024)  Transportation Needs: No Transportation Needs (03/04/2024)  Utilities: Not At Risk (03/04/2024)  Financial Resource Strain: Low Risk  (09/26/2023)   Received from [facility]  Social Connections: Moderately Isolated (03/04/2024)  Tobacco Use: Low Risk  (03/04/2024)  Recent Concern: Tobacco Use - Medium Risk (02/25/2024)   Received from [facility]    Readmission Risk Interventions     No data to display

## 2024-03-09 NOTE — Progress Notes (Signed)
 Mobility Specialist - Progress Note   03/09/24 0936  Mobility  Activity Stood at bedside  Level of Assistance Contact guard assist, steadying assist (+2 for safety)  Assistive Device Front wheel walker  RLE Weight Bearing Per Provider Order WBAT  Activity Response Tolerated well  Mobility visit 1 Mobility  Mobility Specialist Start Time (ACUTE ONLY) 0914  Mobility Specialist Stop Time (ACUTE ONLY) [id number]  Mobility Specialist Time Calculation (min) (ACUTE ONLY) 9 min   Pt sitting in the recliner upon entry, utilizing RA. Pt agreeable to participate in activity this date, expressing min pain. Pt able to indep scoot towards the edge of the seated and bring leg closer to person prior to standing. Pt STS to RW MinG-CGA x2 requiring little assist the second bout of standing d/t fatigue. Pt stood at the recliner >74min each bout before returning seated. Pt left seated in the recliner with alarm set and needs within reach.   Zetta Bills Mobility Specialist 03/09/24 9:42 AM

## 2024-03-09 NOTE — Care Management Important Message (Signed)
 Important Message  Patient Details  Name: Jodi [name] MRN: [id number] Date of Birth: 12/29/1940   Important Message Given:  Yes - Medicare IM     Cristela Blue, CMA 03/09/2024, 10:04 AM

## 2024-03-09 NOTE — Progress Notes (Signed)
 Physical Therapy Treatment Patient Details Name: Jodi [name] MRN: [id number] DOB: 25-Oct-1941 Today's Date: 03/09/2024   [name] Pt is an 83 yo female s/p R TKA. PMH of anxiety, GERD, hypothyroidism, L TKA.    PT Comments  Pt received in bed, agreed to PT session. Mod/MinA to transfer to EOB with heavy use of rails. Increased time to scoot to edge of bed and allow R knee to flex down. Pt required MinA to stand at RW from bed and [facility]. Gait progression to 91ft with RW, step to gait with limited wt shift onto R LE. CGA provided, no LOB or knee buckling noted. Pt positioned to comfort in recliner with roll under Right heel to facilitate knee extension. Nursing in to give pain meds due to 9/10 R knee pain after mobility. Overall, pt's functional mobility is slowly improving as pain level decreases. Pt however is not at baseline LOF and will benefit from STR prior to returning home.    If plan is discharge home, recommend the following: A lot of help with walking and/or transfers;A lot of help with bathing/dressing/bathroom;Help with stairs or ramp for entrance;Assist for transportation;Assistance with cooking/housework   Can travel by private vehicle     No  Equipment Recommendations  Other (comment) (TBD at next level of care)    Recommendations for Other Services       Precautions / Restrictions Precautions Precautions: Fall Recall of Precautions/Restrictions: Intact Precaution/Restrictions Comments: Pt repeated educated on importance of promoting knee extension through proper positioning Restrictions Weight Bearing Restrictions Per Provider Order: Yes RLE Weight Bearing Per Provider Order: Weight bearing as tolerated     Mobility  Bed Mobility Overal bed mobility: Needs Assistance Bed Mobility: Supine to Sit     Supine to sit: Mod assist, Min assist, Used rails     General bed mobility comments: Slightly less assist needed to transfer to EOB with less c/o  pain this date    Transfers Overall transfer level: Needs assistance Equipment used: Rolling walker (2 wheels) Transfers: Sit to/from Stand Sit to Stand: Min assist           General transfer comment: Improved tolerance to stand from bed and BSC    Ambulation/Gait Ambulation/Gait assistance: Contact guard assist Gait Distance (Feet): 20 Feet Assistive device: Rolling walker (2 wheels) Gait Pattern/deviations: Step-to pattern, Decreased stance time - right, Antalgic, Trunk flexed, Decreased step length - left Gait velocity: decreased     General Gait Details: Increased distance followed with chair to maximize progression   Stairs             Wheelchair Mobility     Tilt Bed    Modified Rankin (Stroke Patients Only)       Balance Overall balance assessment: Needs assistance Sitting-balance support: Feet supported Sitting balance-Leahy Scale: Good     Standing balance support: Bilateral upper extremity supported, During functional activity, Reliant on assistive device for balance Standing balance-Leahy Scale: Fair Standing balance comment: High fall risk                            Communication Communication Communication: No apparent difficulties  Cognition Arousal: Alert Behavior During Therapy: WFL for tasks assessed/performed   PT - Cognitive impairments: Safety/Judgement, Problem solving                       PT - Cognition Comments: Pt tearful and moaning, tensing  up throughout session requiring Max cues to mobilize Following commands: Intact      Cueing Cueing Techniques: Verbal cues, Tactile cues, Visual cues  Exercises Total Joint Exercises Ankle Circles/Pumps: AROM, Strengthening, Both, 10 reps Quad Sets: AROM, Strengthening, Right, 10 reps General Exercises - Lower Extremity Long Arc Quad: AAROM, Right, 10 reps, Seated Heel Slides: AAROM, Right, 5 reps, Limitations    General Comments        Pertinent  Vitals/Pain Pain Assessment Pain Assessment: 0-10 Pain Score: 9  Pain Location: R knee (TKA) Pain Descriptors / Indicators: Aching, Grimacing, Guarding, Moaning, Operative site guarding Pain Intervention(s): Patient requesting pain meds-RN notified    Home Living                          Prior Function            PT Goals (current goals can now be found in the care plan section) Acute Rehab PT Goals Patient Stated Goal: to go home Progress towards PT goals: Progressing toward goals    Frequency    BID      PT Plan      Co-evaluation              AM-PAC PT "6 Clicks" Mobility   Outcome Measure  Help needed turning from your back to your side while in a flat bed without using bedrails?: A Lot Help needed moving from lying on your back to sitting on the side of a flat bed without using bedrails?: A Lot Help needed moving to and from a bed to a chair (including a wheelchair)?: A Lot Help needed standing up from a chair using your arms (e.g., wheelchair or bedside chair)?: A Lot Help needed to walk in hospital room?: A Lot Help needed climbing 3-5 steps with a railing? : Total 6 Click Score: 11    End of Session Equipment Utilized During Treatment: Gait belt Activity Tolerance: Patient limited by pain Patient left: in chair;with call bell/phone within reach;with chair alarm set Nurse Communication: Mobility status PT Visit Diagnosis: Other abnormalities of gait and mobility (R26.89);Difficulty in walking, not elsewhere classified (R26.2);Muscle weakness (generalized) (M62.81);Pain Pain - Right/Left: Right Pain - part of body: Knee     Time: 1200-1239 PT Time Calculation (min) (ACUTE ONLY): 39 min  Charges:    $Gait Training: 8-22 mins $Therapeutic Exercise: 8-22 mins $Therapeutic Activity: 8-22 mins PT General Charges $$ ACUTE PT VISIT: 1 Visit                    Zadie Cleverly, PTA  Jannet Askew 03/09/2024, 2:42 PM

## 2024-03-09 NOTE — Progress Notes (Signed)
 Called [facility] and gave report to [place], LPN the nurse who will have Ms. Jodi [name]. Pt will be transported by EMS. The writer will continue to monitor pt until she discharges.

## 2024-03-10 ENCOUNTER — Encounter: Payer: Self-pay | Admitting: Student

## 2024-03-10 ENCOUNTER — Non-Acute Institutional Stay (SKILLED_NURSING_FACILITY): Payer: Self-pay | Admitting: Student

## 2024-03-10 DIAGNOSIS — G629 Polyneuropathy, unspecified: Secondary | ICD-10-CM | POA: Insufficient documentation

## 2024-03-10 DIAGNOSIS — E785 Hyperlipidemia, unspecified: Secondary | ICD-10-CM | POA: Diagnosis not present

## 2024-03-10 DIAGNOSIS — E039 Hypothyroidism, unspecified: Secondary | ICD-10-CM

## 2024-03-10 DIAGNOSIS — Z79899 Other long term (current) drug therapy: Secondary | ICD-10-CM

## 2024-03-10 DIAGNOSIS — K5900 Constipation, unspecified: Secondary | ICD-10-CM

## 2024-03-10 DIAGNOSIS — Z96651 Presence of right artificial knee joint: Secondary | ICD-10-CM | POA: Diagnosis not present

## 2024-03-10 DIAGNOSIS — Z96652 Presence of left artificial knee joint: Secondary | ICD-10-CM

## 2024-03-10 DIAGNOSIS — G6289 Other specified polyneuropathies: Secondary | ICD-10-CM

## 2024-03-10 NOTE — Progress Notes (Unsigned)
 Provider:  Dr. Earnestine Mealing Location:  Other [place].  Nursing Home Room Number: [facility] 114A Place of Service:  SNF (31)  PCP: Dorothey Baseman, MD Patient Care Team: Dorothey Baseman, MD as PCP - General [facility] Medicine)  Extended Emergency Contact Information Primary Emergency Contact: Weckerly,Ronald Address: [address]          [place], [place] [address] [place] of [place] Home Phone: [phone] Mobile Phone: [phone] Relation: Spouse Secondary Emergency Contact: Kandis Mannan Address: [address]          [place], [place] [id number] Home Phone: [phone] Work Phone: [phone] Relation: Daughter  Code Status: Full Code.  Goals of Care: Advanced Directive information    03/10/2024    8:58 AM  Advanced Directives  Does Patient Have a Medical Advance Directive? No  Would patient like information on creating a medical advance directive? No - Patient declined      Chief Complaint  Patient presents with   New Admit To SNF    Admission     HPI: Patient is a 83 y.o. female seen today for admission to [facility].   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   Osteoarthritis of right knee    PONV (postoperative nausea and vomiting)    long time ago, only one time   Vertigo    started recently, may need a steadying hand   Past Surgical History:  Procedure Laterality Date   ABDOMINAL HYSTERECTOMY     APPENDECTOMY     BACK SURGERY     CHOLECYSTECTOMY     COLONOSCOPY     DORSAL COMPARTMENT RELEASE Right 12/03/2022   Procedure: RELEASE DORSAL COMPARTMENT (DEQUERVAIN);  Surgeon: Kennedy Bucker, MD;  Location: [facility];  Service: Orthopedics;  Laterality: Right;   ESOPHAGOGASTRODUODENOSCOPY     ESOPHAGOGASTRODUODENOSCOPY (EGD) WITH PROPOFOL  N/A 05/05/2023   Procedure: ESOPHAGOGASTRODUODENOSCOPY (EGD) WITH PROPOFOL;  Surgeon: Jaynie Collins, DO;  Location: [facility] ENDOSCOPY;  Service: Gastroenterology;  Laterality: N/A;   EYE SURGERY     HAND SURGERY Bilateral    KNEE ARTHROSCOPY Left 01/28/2023   Procedure: Left knee arthroscopic major synovectomy;  Surgeon: Kennedy Bucker, MD;  Location: [facility] SURGERY [facility];  Service: Orthopedics;  Laterality: Left;   KNEE SURGERY     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 She has never used smokeless tobacco. She reports that she does not drink alcohol and does not use drugs. Social History   Socioeconomic History   Marital status: Married    Spouse name: Windy Fast   Number of children: Not on file   Years of education: Not on file   Highest education level: Not on file  Occupational History   Not on file  Tobacco Use   Smoking status: Never   Smokeless tobacco: Never  Vaping Use   Vaping status: Never Used  Substance and Sexual Activity   Alcohol use: No   Drug use: No  Sexual activity: Not on file  Other Topics Concern   Not on file  Social History Narrative   Daughter living with parents.   Social Drivers of Corporate investment banker Strain: Low Risk  (09/26/2023)   Received from [facility]   Overall Financial Resource Strain (CARDIA)    Difficulty of Paying Living Expenses: Not hard at all  Food Insecurity: No Food Insecurity (03/04/2024)   Hunger Vital Sign    Worried About Running Out of Food in the Last Year: Never true    Ran Out of Food in the Last Year: Never true  Transportation Needs: No  Transportation Needs (03/04/2024)   PRAPARE - Administrator, Civil Service (Medical): No    Lack of Transportation (Non-Medical): No  Physical Activity: Not on file  Stress: Not on file  Social Connections: Moderately Isolated (03/04/2024)   Social Connection and Isolation Panel [NHANES]    Frequency of Communication with Friends and Family: Never    Frequency of Social Gatherings with Friends and Family: Never    Attends Religious Services: Never    Database administrator or Organizations: Yes    Attends Banker Meetings: 1 to 4 times per year    Marital Status: Married  Catering manager Violence: Not At Risk (03/04/2024)   Humiliation, Afraid, Rape, and Kick questionnaire    Fear of Current or Ex-Partner: No    Emotionally Abused: No    Physically Abused: No    Sexually Abused: No    Functional Status Survey:    History reviewed. No pertinent family history.  Health Maintenance  Topic Date Due   DEXA SCAN  Never done   Medicare Annual Wellness (AWV)  10/30/2016   Pneumonia Vaccine 50+ Years old (2 of 2 - PPSV23 or PCV20) 08/23/2020   COVID-19 Vaccine (6 - 2024-25 season) 08/24/2023   DTaP/Tdap/Td (2 - Td or Tdap) 11/01/2024   INFLUENZA VACCINE  Completed   Zoster Vaccines- Shingrix  Completed   HPV VACCINES  Aged Out    Allergies  Allergen Reactions   Lyrica [Pregabalin]     May have seen double    Oxycodone-Acetaminophen Itching    Outpatient Encounter Medications as of 03/10/2024  Medication Sig   acetaminophen (TYLENOL) 500 MG tablet Take 2 tablets (1,000 mg total) by mouth every 8 (eight) hours.   Apoaequorin (PREVAGEN EXTRA STRENGTH) 20 MG CAPS Take 20 mg by mouth daily.   calcium carbonate (TUMS - DOSED IN MG ELEMENTAL CALCIUM) 500 MG chewable tablet Chew 2 tablets by mouth as needed for indigestion or heartburn.   celecoxib (CELEBREX) 100 MG capsule Take 1 capsule (100 mg total) by mouth 2 (two) times daily for 7 days.    cholecalciferol (VITAMIN D3) 25 MCG (1000 UNIT) tablet Take 1,000 Units by mouth daily.   cyclobenzaprine (FLEXERIL) 10 MG tablet Take 10 mg by mouth at bedtime.   cycloSPORINE (RESTASIS) 0.05 % ophthalmic emulsion Place 1 drop into both eyes 2 (two) times daily.   docusate sodium (COLACE) 100 MG capsule Take 1 capsule (100 mg total) by mouth 2 (two) times daily.   enoxaparin (LOVENOX) 40 MG/0.4ML injection Inject 0.4 mLs (40 mg total) into the skin daily for 14 days.   esomeprazole (NEXIUM) 40 MG capsule Take 40 mg by mouth daily.   gabapentin (NEURONTIN) 300 MG capsule Take 600 mg by mouth at bedtime.   levothyroxine (SYNTHROID) 100 MCG tablet Take 100 mcg by mouth daily  before breakfast.   magnesium oxide (MAG-OX) 400 (240 Mg) MG tablet Take 400 mg by mouth daily.   Multiple Vitamin (MULTIVITAMIN) capsule Take 1 capsule by mouth daily.   mupirocin ointment (BACTROBAN) 2 % Place 1 Application into the nose 2 (two) times daily for 60 doses. Use as directed 2 times daily for 5 days every other week for 6 weeks. (Patient taking differently: Place 1 Application into the nose 2 (two) times daily.)   ondansetron (ZOFRAN) 4 MG tablet Take 1 tablet (4 mg total) by mouth every 6 (six) hours as needed for nausea.   Probiotic Product (PROBIOTIC PO) Take 1 capsule by mouth daily.   pyridOXINE (VITAMIN B6) 100 MG tablet Take 100 mg by mouth daily.   senna-docusate (SENOKOT-S) 8.6-50 MG tablet Take 2 tablets by mouth daily as needed for mild constipation.   traMADol (ULTRAM) 50 MG tablet Take 1 tablet (50 mg total) by mouth every 6 (six) hours as needed for moderate pain (pain score 4-6).   vitamin B-12 (CYANOCOBALAMIN) 1000 MCG tablet Take 1,000 mcg by mouth daily.   chlorhexidine (HIBICLENS) 4 % external liquid Apply 15 mLs (1 Application total) topically as directed for 30 doses. Use as directed daily for 5 days every other week for 6 weeks. (Patient not taking: Reported on 03/10/2024)   Homeopathic Products  (LEG CRAMPS) TABS Take 2 tablets by mouth 4 (four) times daily. (Patient not taking: Reported on 03/10/2024)   No facility-administered encounter medications on file as of 03/10/2024.    Review of Systems  Vitals:   03/10/24 0832 03/10/24 0859  BP: (!) 162/81 (!) 152/81  Pulse: 75   Resp: 16   Temp: 98.2 F (36.8 C)   SpO2: 98%   Weight: 190 lb 9.6 oz (86.5 kg)   Height: 5\' 2"  (1.575 m)    Body mass index is 34.86 kg/m. Physical Exam  Labs reviewed: Basic Metabolic Panel: Recent Labs    02/27/24 1443 03/05/24 0519  NA 141 138  K 4.2 4.3  CL 105 104  CO2 25 23  GLUCOSE 88 119*  BUN 23 21  CREATININE 1.06* 1.06*  CALCIUM 9.5 8.8*   Liver Function Tests: Recent Labs    02/27/24 1443  AST 23  ALT 18  ALKPHOS 56  BILITOT 0.6  PROT 7.1  ALBUMIN 4.0   No results for input(s): "LIPASE", "AMYLASE" in the last 8760 hours. No results for input(s): "AMMONIA" in the last 8760 hours. CBC: Recent Labs    02/27/24 1443 03/05/24 0519 03/06/24 0158  WBC 6.5 12.8* 10.5  NEUTROABS 3.4  --   --   HGB 14.0 12.7 12.7  HCT 42.9 38.1 39.0  MCV 95.1 94.3 94.2  PLT 304 254 216   Cardiac Enzymes: No results for input(s): "CKTOTAL", "CKMB", "CKMBINDEX", "TROPONINI" in the last 8760 hours. BNP: Invalid input(s): "POCBNP" No results found for: "HGBA1C" No results found for: "TSH" No results found for: "VITAMINB12" No results found for: "FOLATE" No results found for: "IRON", "TIBC", "FERRITIN"  Imaging and Procedures obtained prior to SNF admission: DG Knee 1-2 Views Right Result Date: 03/04/2024 CLINICAL DATA:  Status post knee replacement. EXAM: RIGHT KNEE - 1-2 VIEW COMPARISON:  None Available. FINDINGS: Right knee arthroplasty in expected alignment. No periprosthetic lucency or fracture. There has been patellar resurfacing. Recent postsurgical change includes air and edema in the soft tissues and joint space. IMPRESSION: Right knee arthroplasty without immediate  postoperative complication. Electronically Signed   By: Narda Rutherford  M.D.   On: 03/04/2024 16:15    Assessment/Plan There are no diagnoses linked to this encounter.   Family/ staff Communication:   Labs/tests ordered:

## 2024-09-23 ENCOUNTER — Encounter: Payer: Self-pay | Admitting: Orthopedic Surgery

## 2024-09-23 NOTE — Progress Notes (Deleted)
 Referring Physician:  Glover Lenis, MD [id number] [place],  [place] [address]  Primary Physician:  Glover Lenis, MD  [name]: 09/23/2024*** Jodi [name] has a history of hypothyroidism, peripheral neuropathy, hyperlipidemia, chronic pain.   History of ACDF in 1999 and lumbar surgery in 1987.  Back pain Any leg pain?  Duration: *** Location: *** Quality: *** Severity: ***  Precipitating: aggravated by *** Modifying factors: made better by *** Weakness: none Timing: ***  Tobacco use: Does not smoke.   Bowel/Bladder Dysfunction: none  Conservative measures:  Physical therapy: *** has not participated in PT for back Multimodal medical therapy including regular antiinflammatories: *** Tylenol , Gabapentin , Tramadol  Injections: no epidural steroid injections  Past Surgery:  Neck surgery 1999 lumbar surgery 1987  [name] has ***no symptoms of cervical myelopathy.  The symptoms are causing a significant impact on the patient's life.   Review of Systems:  A 10 point review of systems is negative, except for the pertinent positives and negatives detailed in the HPI.  Past Medical History: 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   Osteoarthritis of right knee    PONV (postoperative nausea and vomiting)    long time ago, only one time   Vertigo    started recently, may need a steadying hand    Past Surgical History: Past Surgical History:  Procedure Laterality Date   ABDOMINAL HYSTERECTOMY     APPENDECTOMY     BACK SURGERY     CHOLECYSTECTOMY     COLONOSCOPY     DORSAL COMPARTMENT RELEASE Right 12/03/2022   Procedure: RELEASE DORSAL COMPARTMENT (DEQUERVAIN);  Surgeon: Kathlynn Shar[place], MD;  Location: [facility];  Service: Orthopedics;   Laterality: Right;   ESOPHAGOGASTRODUODENOSCOPY     ESOPHAGOGASTRODUODENOSCOPY (EGD) WITH PROPOFOL  N/A 05/05/2023   Procedure: ESOPHAGOGASTRODUODENOSCOPY (EGD) WITH PROPOFOL ;  Surgeon: Onita Elspeth [place], DO;  Location: [facility] ENDOSCOPY;  Service: Gastroenterology;  Laterality: N/A;   EYE SURGERY     HAND SURGERY Bilateral    KNEE ARTHROSCOPY Left 01/28/2023   Procedure: Left knee arthroscopic major synovectomy;  Surgeon: Kathlynn Sharper, MD;  Location: [facility];  Service: Orthopedics;  Laterality: Left;   KNEE SURGERY     TOTAL KNEE ARTHROPLASTY Left 07/04/2022   Procedure: TOTAL KNEE ARTHROPLASTY;  Surgeon: Kathlynn Sharper, MD;  Location: [facility] ORS;  Service: Orthopedics;  Laterality: Left;   TOTAL KNEE ARTHROPLASTY Right 03/04/2024   Procedure: ARTHROPLASTY, KNEE, TOTAL;  Surgeon: Lorelle Hussar, MD;  Location: [facility] ORS;  Service: Orthopedics;  Laterality: Right;    Allergies: Allergies as of 09/30/2024 - Review Complete 03/10/2024  Allergen Reaction Noted   Lyrica [pregabalin]  06/04/2015   Oxycodone -acetaminophen  Itching     Medications: Outpatient Encounter Medications as of 09/30/2024  Medication Sig   acetaminophen  (TYLENOL ) 500 MG tablet Take 2 tablets (1,000 mg total) by mouth every 8 (eight) hours.   Apoaequorin (PREVAGEN EXTRA STRENGTH) 20 MG CAPS Take 20 mg by mouth daily.   calcium  carbonate (TUMS - DOSED IN MG ELEMENTAL CALCIUM ) 500 MG chewable tablet Chew 2 tablets by mouth as needed for indigestion or heartburn.   chlorhexidine  (HIBICLENS ) 4 % external liquid Apply 15 mLs (1 Application total) topically as directed for 30 doses. Use as directed daily  for 5 days every other week for 6 weeks. (Patient not taking: Reported on 03/10/2024)   cholecalciferol (VITAMIN D3) 25 MCG (1000 UNIT) tablet Take 1,000 Units by mouth daily.   cyclobenzaprine  (FLEXERIL ) 10 MG tablet Take 10 mg by mouth at bedtime.   cycloSPORINE  (RESTASIS ) 0.05 % ophthalmic emulsion Place 1 drop  into both eyes 2 (two) times daily.   docusate sodium  (COLACE) 100 MG capsule Take 1 capsule (100 mg total) by mouth 2 (two) times daily.   enoxaparin  (LOVENOX ) 40 MG/0.4ML injection Inject 0.4 mLs (40 mg total) into the skin daily for 14 days.   esomeprazole (NEXIUM) 40 MG capsule Take 40 mg by mouth daily.   gabapentin  (NEURONTIN ) 300 MG capsule Take 600 mg by mouth at bedtime.   Homeopathic Products (LEG CRAMPS) TABS Take 2 tablets by mouth 4 (four) times daily. (Patient not taking: Reported on 03/10/2024)   levothyroxine  (SYNTHROID ) 100 MCG tablet Take 100 mcg by mouth daily before breakfast.   magnesium  oxide (MAG-OX) 400 (240 Mg) MG tablet Take 400 mg by mouth daily.   Multiple Vitamin (MULTIVITAMIN) capsule Take 1 capsule by mouth daily.   ondansetron  (ZOFRAN ) 4 MG tablet Take 1 tablet (4 mg total) by mouth every 6 (six) hours as needed for nausea.   Probiotic Product (PROBIOTIC PO) Take 1 capsule by mouth daily.   pyridOXINE (VITAMIN B6) 100 MG tablet Take 100 mg by mouth daily.   senna-docusate (SENOKOT-S) 8.6-50 MG tablet Take 2 tablets by mouth daily as needed for mild constipation.   traMADol  (ULTRAM ) 50 MG tablet Take 1 tablet (50 mg total) by mouth every 6 (six) hours as needed for moderate pain (pain score 4-6).   vitamin B-12 (CYANOCOBALAMIN ) 1000 MCG tablet Take 1,000 mcg by mouth daily.   No facility-administered encounter medications on file as of 09/30/2024.    Social History: Social History   Tobacco Use   Smoking status: Never   Smokeless tobacco: Never  Vaping Use   Vaping status: Never Used  Substance Use Topics   Alcohol use: No   Drug use: No    Family Medical History: No family history on file.  Physical Examination: There were no vitals filed for this visit.  General: Patient is well developed, well nourished, calm, collected, and in no apparent distress. Attention to examination is appropriate.  Respiratory: Patient is breathing without any  difficulty.   NEUROLOGICAL:     Awake, alert, oriented to person, place, and time.  Speech is clear and fluent. Fund of knowledge is appropriate.   Cranial Nerves: Pupils equal round and reactive to light.  Facial tone is symmetric.    *** ROM of cervical spine *** pain *** posterior cervical tenderness. *** tenderness in bilateral trapezial region.   *** ROM of lumbar spine *** pain *** posterior lumbar tenderness.   No abnormal lesions on exposed skin.   Strength: Side Biceps Triceps Deltoid Interossei Grip Wrist Ext. Wrist Flex.  R 5 5 5 5 5 5 5   L 5 5 5 5 5 5 5    Side Iliopsoas Quads Hamstring PF DF EHL  R 5 5 5 5 5 5   L 5 5 5 5 5 5    Reflexes are ***2+ and symmetric at the biceps, brachioradialis, patella and achilles.   Hoffman's is absent.  Clonus is not present.   Bilateral upper and lower extremity sensation is intact to light touch.     Gait is normal.   ***No difficulty with tandem gait.  Medical Decision Making  Imaging: Lumbar MRI dated 10/17/23:  FINDINGS: Segmentation: Standard; the lowest formed disc space is designated L5-S1.   Alignment: There is similar levocurvature centered at L2. There is no antero or retrolisthesis.   Vertebrae: Vertebral body heights are preserved. Background marrow signal is normal. There is no suspicious marrow signal abnormality or marrow edema.   Conus medullaris and cauda equina: Conus extends to the L1-L2 level. Conus and cauda equina appear normal.   Paraspinal and other soft tissues: Unremarkable.   Disc levels:   There is moderate to severe left eccentric disc degeneration at L4-L5, similar to the prior study. There is otherwise overall mild disc desiccation and narrowing throughout the remainder of the lumbar spine.   T12-L1: There is a mild disc bulge and mild facet arthropathy without significant spinal canal or neural foraminal stenosis, not significantly changed.   L1-L2: There is a mild disc  bulge and mild bilateral facet arthropathy without significant spinal canal or neural foraminal stenosis, not significantly changed.   L2-L3: There is a disc bulge and moderate bilateral facet arthropathy with ligamentum flavum thickening resulting in mild spinal canal stenosis and mild right and no significant left neural foraminal stenosis not significantly changed.   L3-L4: There is a disc bulge eccentric to the right and advanced left worse than right facet arthropathy with ligamentum flavum thickening resulting in moderate spinal canal stenosis with left worse than right subarticular zone narrowing which may affect the traversing left L4 nerve root (114-23), and moderate left and mild right neural foraminal stenosis. The facet arthropathy and spinal canal stenosis are slightly progressed since 2022.   L4-L5: Prior posterior decompression at this level. There is a mild disc bulge, left-sided endplate spurring, and advanced facet arthropathy resulting in left subarticular zone effacement which may affect the traversing L5 nerve root (114-28), and moderate to severe left and no significant right neural foraminal stenosis. Findings are not significantly changed.   L5-S1: There is a small left foraminal protrusion and moderate bilateral facet arthropathy resulting in mild left and no significant right neural foraminal stenosis without significant spinal canal stenosis not significantly changed.   IMPRESSION: 1. Disc bulge eccentric to the right and advanced left worse than right facet arthropathy with ligamentum flavum thickening at L3-L4 results in moderate spinal canal stenosis with left worse than right subarticular zone effacement which may affect the traversing left L4 nerve root, and moderate left and mild right neural foraminal stenosis. The facet arthropathy and spinal canal stenosis have slightly progressed since 2022. 2. Moderate to severe left eccentric disc  degeneration at L4-L5 with advanced facet arthropathy resulting in left subarticular zone effacement which may affect the traversing L5 nerve root, and moderate to severe left neural foraminal stenosis. Findings are not significantly changed. 3. Moderate facet arthropathy and a disc bulge at L2-L2 resulting in mild spinal canal stenosis and mild right neural foraminal stenosis. 4. Small left foraminal protrusion and moderate bilateral facet arthropathy at L5-S1 result in mild left neural foraminal stenosis, not significantly changed. 5. Similar levocurvature centered at L2.     Electronically Signed   By: Maude Harry M.D.   On: 10/27/2023 10:36      I have personally reviewed the images and agree with the above interpretation.  Assessment and Plan: Ms. Beadles is a pleasant 83 y.o. female has ***  Treatment options discussed with patient and following plan made:   - Order for physical therapy for *** spine ***. Patient to  call to schedule appointment. *** - Continue current medications including ***. Reviewed dosing and side effects.  - Prescription for ***. Reviewed dosing and side effects. Take with food.  - Prescription for *** to take prn muscle spasms. Reviewed dosing and side effects. Discussed this can cause drowsiness.  - MRI of *** to further evaluate *** radiculopathy. No improvement time or medications (***).  - Referral to [facility] at [facility] to discuss possible *** injections.  - Will schedule phone visit to review MRI results once I get them back.   I spent a total of *** minutes in face-to-face and non-face-to-face activities related to this patient's care today including review of outside records, review of imaging, review of symptoms, physical exam, discussion of differential diagnosis, discussion of treatment options, and documentation.   Thank you for involving me in the care of this patient.   Glade Boys PA-C Dept. of Neurosurgery

## 2024-09-30 ENCOUNTER — Ambulatory Visit: Admitting: Orthopedic Surgery

## 2024-11-15 NOTE — Progress Notes (Signed)
 Referring Physician:  Glover Lenis, MD [phone] [place],  [place] [address]  Primary Physician:  Glover Lenis, MD  [name]: 11/24/2024 Jodi [name] has a history of hypothyroidism, peripheral neuropathy, chronic pain, hyperlipidemia, pelvic pain, GERD.   History of lumbar surgery years ago.   She has constant LBP with intermittent left buttock pain with no leg pain. She has known neuropathy in her feet with numbness and tingling. She has weakness in both legs- has history of bilateral TKA. Pain is worse with standing- using a walker helps. Pain also worse with prolonged sitting.   No improvement with medication or injections.   Tobacco use: Does not smoke.   Bowel/Bladder Dysfunction: history of chronic urinary/bowel urgency x years  Conservative measures:  Physical therapy:  has not participated for her back recently-has been doing PT for her knee Multimodal medical therapy including regular antiinflammatories:  Gabapentin , Tramadol , Motrin, Flexeril , Tylenol  Injections:   12/20/2022: Left genicular nerve blocks (7/10 to 1/10)  05/02/2022: RFA to the bilateral L4-5 and L5-S1 facet joints (80% relief) 04/08/2022: MBB to the bilateral L4-5 and L5-S1 facet joints (10/10 to 0/10) 03/19/2022: MBB to the bilateral L4-5 and L5-S1 facet joints (10/10 to 0/10)  12/03/2021: Right L4-5 and left L5-S1 transforaminal ESI (100% relief after on left side and 50% relief on right side)   Past Surgery:  back surgery years ago  Jodi [name] has no symptoms of cervical myelopathy.  The symptoms are causing a significant impact on the patient's life.   Review of Systems:  A 10 point review of systems is negative, except for the pertinent positives and negatives detailed in the HPI.  Past Medical History: Past Medical History:  Diagnosis Date   Anxiety    Arthritis    knees   GERD (gastroesophageal reflux disease)    Hyperlipidemia    Hypothyroidism     Neuromuscular disorder (HCC)    neuropathy in feet and ankles   Osteoarthritis of right knee    PONV (postoperative nausea and vomiting)    long time ago, only one time   Vertigo    started recently, may need a steadying hand    Past Surgical History: Past Surgical History:  Procedure Laterality Date   ABDOMINAL HYSTERECTOMY     APPENDECTOMY     BACK SURGERY     CHOLECYSTECTOMY     COLONOSCOPY     DORSAL COMPARTMENT RELEASE Right 12/03/2022   Procedure: RELEASE DORSAL COMPARTMENT (DEQUERVAIN);  Surgeon: Kathlynn Sharper, MD;  Location: [facility];  Service: Orthopedics;  Laterality: Right;   ESOPHAGOGASTRODUODENOSCOPY     ESOPHAGOGASTRODUODENOSCOPY (EGD) WITH PROPOFOL  N/A 05/05/2023   Procedure: ESOPHAGOGASTRODUODENOSCOPY (EGD) WITH PROPOFOL ;  Surgeon: Onita Elspeth Sharper, DO;  Location: [facility];  Service: Gastroenterology;  Laterality: N/A;   EYE SURGERY     HAND SURGERY Bilateral    KNEE ARTHROSCOPY Left 01/28/2023   Procedure: Left knee arthroscopic major synovectomy;  Surgeon: Kathlynn Sharper, MD;  Location: [facility];  Service: Orthopedics;  Laterality: Left;   KNEE SURGERY     TOTAL KNEE ARTHROPLASTY Left 07/04/2022   Procedure: TOTAL KNEE ARTHROPLASTY;  Surgeon: Kathlynn Sharper, MD;  Location: [facility];  Service: Orthopedics;  Laterality: Left;   TOTAL KNEE ARTHROPLASTY Right 03/04/2024   Procedure: ARTHROPLASTY, KNEE, TOTAL;  Surgeon: Lorelle Hussar, MD;  Location: [facility];  Service: Orthopedics;  Laterality: Right;    Allergies: Allergies as of 11/24/2024 - Review Complete 11/24/2024  Allergen Reaction  Noted   Lyrica [pregabalin]  06/04/2015   Oxycodone -acetaminophen  Itching     Medications: Outpatient Encounter Medications as of 11/24/2024  Medication Sig   acetaminophen  (TYLENOL ) 500 MG tablet Take 2 tablets (1,000 mg total) by mouth every 8 (eight) hours. (Patient taking differently: Take 1,000 mg by mouth every 6 (six) hours as  needed.)   Apoaequorin (PREVAGEN EXTRA STRENGTH) 20 MG CAPS Take 20 mg by mouth daily.   calcium  carbonate (TUMS - DOSED IN MG ELEMENTAL CALCIUM ) 500 MG chewable tablet Chew 2 tablets by mouth as needed for indigestion or heartburn.   cholecalciferol (VITAMIN D3) 25 MCG (1000 UNIT) tablet Take 1,000 Units by mouth daily.   cyclobenzaprine  (FLEXERIL ) 10 MG tablet Take 10 mg by mouth at bedtime.   cycloSPORINE  (RESTASIS ) 0.05 % ophthalmic emulsion Place 1 drop into both eyes 2 (two) times daily.   docusate sodium  (COLACE) 100 MG capsule Take 1 capsule (100 mg total) by mouth 2 (two) times daily. (Patient taking differently: Take 100 mg by mouth daily as needed.)   esomeprazole (NEXIUM) 40 MG capsule Take 40 mg by mouth daily.   gabapentin  (NEURONTIN ) 300 MG capsule Take 600 mg by mouth at bedtime. (Patient taking differently: Take 300 mg by mouth at bedtime.)   Homeopathic Products (LEG CRAMPS) TABS Take 2 tablets by mouth 4 (four) times daily.   levothyroxine  (SYNTHROID ) 100 MCG tablet Take 100 mcg by mouth daily before breakfast.   magnesium  oxide (MAG-OX) 400 (240 Mg) MG tablet Take 400 mg by mouth daily.   Multiple Vitamin (MULTIVITAMIN) capsule Take 1 capsule by mouth daily.   Probiotic Product (PROBIOTIC PO) Take 1 capsule by mouth daily.   pyridOXINE (VITAMIN B6) 100 MG tablet Take 100 mg by mouth daily.   senna-docusate (SENOKOT-S) 8.6-50 MG tablet Take 2 tablets by mouth daily as needed for mild constipation.   traMADol  (ULTRAM ) 50 MG tablet Take 1 tablet (50 mg total) by mouth every 6 (six) hours as needed for moderate pain (pain score 4-6).   vitamin B-12 (CYANOCOBALAMIN ) 1000 MCG tablet Take 1,000 mcg by mouth daily.   chlorhexidine  (HIBICLENS ) 4 % external liquid Apply 15 mLs (1 Application total) topically as directed for 30 doses. Use as directed daily for 5 days every other week for 6 weeks. (Patient not taking: Reported on 03/10/2024)   enoxaparin  (LOVENOX ) 40 MG/0.4ML injection  Inject 0.4 mLs (40 mg total) into the skin daily for 14 days.   ondansetron  (ZOFRAN ) 4 MG tablet Take 1 tablet (4 mg total) by mouth every 6 (six) hours as needed for nausea.   No facility-administered encounter medications on file as of 11/24/2024.    Social History: Social History   Tobacco Use   Smoking status: Former    Current packs/day: 0.00    Types: Cigarettes    Quit date: 1963    Years since quitting: 62.9   Smokeless tobacco: Never  Vaping Use   Vaping status: Never Used  Substance Use Topics   Alcohol use: No   Drug use: No    Family Medical History: No family history on file.  Physical Examination: Vitals:   11/24/24 0900  BP: 112/74    General: Patient is well developed, well nourished, calm, collected, and in no apparent distress. Attention to examination is appropriate.  Respiratory: Patient is breathing without any difficulty.   NEUROLOGICAL:     Awake, alert, oriented to person, place, and time.  Speech is clear and fluent. Fund of knowledge is appropriate.  Cranial Nerves: Pupils equal round and reactive to light.  Facial tone is symmetric.    Mild diffuse lower posterior lumbar tenderness.   No abnormal lesions on exposed skin.   Strength: Side Biceps Triceps Deltoid Interossei Grip Wrist Ext. Wrist Flex.  R 5 5 5 5 5 5 5   L 5 5 5 5 5 5 5    Side Iliopsoas Quads Hamstring PF DF EHL  R 5 5 5 5 5 5   L 5 5 5 5 5 5    Reflexes are 2+ and symmetric at the biceps, brachioradialis, patella and achilles.   Hoffman's is absent.  Clonus is not present.   Bilateral upper and lower extremity sensation is intact to light touch.     No pain with IR/ER of both hips.   She ambulates with a rollator.   Medical Decision Making  Imaging: Lumbar spine MRI dated 10/17/23:  FINDINGS: Segmentation: Standard; the lowest formed disc space is designated L5-S1.   Alignment: There is similar levocurvature centered at L2. There is no antero or  retrolisthesis.   Vertebrae: Vertebral body heights are preserved. Background marrow signal is normal. There is no suspicious marrow signal abnormality or marrow edema.   Conus medullaris and cauda equina: Conus extends to the L1-L2 level. Conus and cauda equina appear normal.   Paraspinal and other soft tissues: Unremarkable.   Disc levels:   There is moderate to severe left eccentric disc degeneration at L4-L5, similar to the prior study. There is otherwise overall mild disc desiccation and narrowing throughout the remainder of the lumbar spine.   T12-L1: There is a mild disc bulge and mild facet arthropathy without significant spinal canal or neural foraminal stenosis, not significantly changed.   L1-L2: There is a mild disc bulge and mild bilateral facet arthropathy without significant spinal canal or neural foraminal stenosis, not significantly changed.   L2-L3: There is a disc bulge and moderate bilateral facet arthropathy with ligamentum flavum thickening resulting in mild spinal canal stenosis and mild right and no significant left neural foraminal stenosis not significantly changed.   L3-L4: There is a disc bulge eccentric to the right and advanced left worse than right facet arthropathy with ligamentum flavum thickening resulting in moderate spinal canal stenosis with left worse than right subarticular zone narrowing which may affect the traversing left L4 nerve root (114-23), and moderate left and mild right neural foraminal stenosis. The facet arthropathy and spinal canal stenosis are slightly progressed since 2022.   L4-L5: Prior posterior decompression at this level. There is a mild disc bulge, left-sided endplate spurring, and advanced facet arthropathy resulting in left subarticular zone effacement which may affect the traversing L5 nerve root (114-28), and moderate to severe left and no significant right neural foraminal stenosis. Findings are not  significantly changed.   L5-S1: There is a small left foraminal protrusion and moderate bilateral facet arthropathy resulting in mild left and no significant right neural foraminal stenosis without significant spinal canal stenosis not significantly changed.   IMPRESSION: 1. Disc bulge eccentric to the right and advanced left worse than right facet arthropathy with ligamentum flavum thickening at L3-L4 results in moderate spinal canal stenosis with left worse than right subarticular zone effacement which may affect the traversing left L4 nerve root, and moderate left and mild right neural foraminal stenosis. The facet arthropathy and spinal canal stenosis have slightly progressed since 2022. 2. Moderate to severe left eccentric disc degeneration at L4-L5 with advanced facet arthropathy resulting in left subarticular  zone effacement which may affect the traversing L5 nerve root, and moderate to severe left neural foraminal stenosis. Findings are not significantly changed. 3. Moderate facet arthropathy and a disc bulge at L2-L2 resulting in mild spinal canal stenosis and mild right neural foraminal stenosis. 4. Small left foraminal protrusion and moderate bilateral facet arthropathy at L5-S1 result in mild left neural foraminal stenosis, not significantly changed. 5. Similar levocurvature centered at L2.     Electronically Signed   By: Maude Harry M.D.   On: 10/27/2023 10:36    I have personally reviewed the images and agree with the above interpretation.  Assessment and Plan: Ms. Ferrer has a history of lumbar surgery years ago.   She has constant LBP with intermittent left buttock pain with no leg pain. She has weakness in both legs- has history of bilateral TKA. Pain is worse with standing- using a walker helps. Pain also worse with prolonged sitting.   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   Treatment options discussed with patient and following plan made:   - Lumbar xrays ordered to get on her way out. Will call her with results.  - No significant change in pain since last lumbar MRI. Discussed with patient and will hold on updating it for now.  - PT for lumbar spine- message to PCP Derinda) to see if he will order it at [facility].  - Referral to PMR at [facility] to discuss possible lumbar injections. Has seen Chasnis in the past.  - Follow up with me in 2-3 months and prn.   I spent a total of 30 minutes in face-to-face and non-face-to-face activities related to this patient's care today including review of outside records, review of imaging, review of symptoms, physical exam, discussion of differential diagnosis, discussion of treatment options, and documentation.   Thank you for involving me in the care of this patient.   Glade Boys PA-C Dept. of Neurosurgery

## 2024-11-24 ENCOUNTER — Ambulatory Visit: Admitting: Orthopedic Surgery

## 2024-11-24 ENCOUNTER — Ambulatory Visit

## 2024-11-24 ENCOUNTER — Encounter: Payer: Self-pay | Admitting: Orthopedic Surgery

## 2024-11-24 VITALS — BP 112/74 | Ht 62.0 in | Wt 176.2 lb

## 2024-11-24 DIAGNOSIS — M47816 Spondylosis without myelopathy or radiculopathy, lumbar region: Secondary | ICD-10-CM

## 2024-11-24 DIAGNOSIS — M48061 Spinal stenosis, lumbar region without neurogenic claudication: Secondary | ICD-10-CM

## 2024-11-24 NOTE — Patient Instructions (Signed)
 It was so nice to see you today. Thank you so much for coming in.    You have some wear and tear in your back (arthritis) and this is likely what is causing your pain.   You had xrays done today. We will call you with results.   I sent a message to Dr. Glover to put in physical therapy orders at [facility]. They should call you to schedule this.   I want you to see physical medicine and rehab at the [facility] to discuss possible injections in your lower back. Dr. Avanell, Dr. Dodson, and their NP [place] are great and will take good care of you. They should call you to schedule an appointment or you can call them at [phone].  I will see you back in 2-3 months for follow up. Please do not hesitate to call if you have any questions or concerns. You can also message me in MyChart.   Glade Boys PA-C [phone]     The physicians and staff at [facility] at [facility] are committed to providing excellent care. You may receive a survey asking for feedback about your experience at our office. We value you your feedback and appreciate you taking the time to to fill it out. The [facility] leadership team is also available to discuss your experience in person, feel free to contact us  [phone].

## 2024-12-01 ENCOUNTER — Telehealth: Payer: Self-pay | Admitting: Orthopedic Surgery

## 2024-12-01 NOTE — Telephone Encounter (Signed)
 Please call and let her know that I reviewed her lumbar xrays.   She has a left sided curve in her mid/lower back that may have gotten a little worse since last xray. She also has a lot of arthritis and wear and tear.   No change to plan we discussed at her visit.   Let me know if she has further questions and I can answer them or call her.   Thanks!

## 2024-12-01 NOTE — Telephone Encounter (Signed)
 Lumbar xrays dated 11/24/24:  FINDINGS:   LUMBAR SPINE: BONES: Vertebral body heights are maintained. Worsening of the left convex scoliosis centered in the upper lumbar spine. No fracture or subluxation.   DISCS AND DEGENERATIVE CHANGES: Diffuse degenerative facet disease and degenerative disc disease.   SOFT TISSUES: No acute abnormality.   IMPRESSION: 1. No acute bony abnormality. 2. Worsening of the left convex scoliosis centered in the upper lumbar spine. 3. 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   Electronically signed by: Franky Crease MD 11/30/2024 09:17 PM EST RP Workstation: [id number]  I have personally reviewed the images and agree with the above interpretation.

## 2025-01-21 NOTE — Progress Notes (Unsigned)
 "  Referring Physician:  Glover Lenis, MD [phone] [address] [place],  [place] [address]  Primary Physician:  Glover Lenis, MD  [name]: 01/21/2025 Ms. Jodi [name] has a history of hypothyroidism, peripheral neuropathy, chronic pain, hyperlipidemia, pelvic pain, GERD.   History of lumbar surgery years ago.   Last seen by me on 11/24/24 for constant LBP with intermittent left buttock pain. 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 Xrays showed some progression of left sided lumbar curve.   She had initial evaluation with PT at [facility] on 01/04/25. She had a Right L5-S1 and left S1 TF ESI with Dr. Avanell on 01/10/25.   She is here for follow up.      She has constant LBP with intermittent left buttock pain with no leg pain. She has known neuropathy in her feet with numbness and tingling. She has weakness in both legs- has history of bilateral TKA. Pain is worse with standing- using a walker helps. Pain also worse with prolonged sitting.   No improvement with medication or injections.   Tobacco use: Does not smoke.   Bowel/Bladder Dysfunction: history of chronic urinary/bowel urgency x years  Conservative measures:  Physical therapy:  initial evaluation on 01/04/25 for her back at [facility] Multimodal medical therapy including regular antiinflammatories:  Gabapentin , Tramadol , Motrin, Flexeril , Tylenol  Injections:   01/10/2025: Right L5-S1 and left S1 transforaminal ESI  12/20/2022: Left genicular nerve blocks (7/10 to 1/10)  05/02/2022: RFA to the bilateral L4-5 and L5-S1 facet joints (80% relief) 04/08/2022: MBB to the bilateral L4-5 and L5-S1 facet joints (10/10 to 0/10) 03/19/2022: MBB to the bilateral L4-5 and L5-S1 facet joints (10/10 to 0/10)  12/03/2021: Right L4-5 and left L5-S1 transforaminal ESI (100% relief after on left side and 50% relief on right  side)   Past Surgery:  back surgery years ago  Jodi [name] has no symptoms of cervical myelopathy.  The symptoms are causing a significant impact on the patient's life.   Review of Systems:  A 10 point review of systems is negative, except for the pertinent positives and negatives detailed in the HPI.  Past Medical History: 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   Osteoarthritis of right knee    PONV (postoperative nausea and vomiting)    long time ago, only one time   Vertigo    started recently, may need a steadying hand    Past Surgical History: Past Surgical History:  Procedure Laterality Date   ABDOMINAL HYSTERECTOMY     APPENDECTOMY     BACK SURGERY     CHOLECYSTECTOMY     COLONOSCOPY     DORSAL COMPARTMENT RELEASE Right 12/03/2022   Procedure: RELEASE DORSAL COMPARTMENT (DEQUERVAIN);  Surgeon: Kathlynn Sharper, MD;  Location: [facility];  Service: Orthopedics;  Laterality: Right;   ESOPHAGOGASTRODUODENOSCOPY     ESOPHAGOGASTRODUODENOSCOPY (EGD) WITH PROPOFOL  N/A 05/05/2023   Procedure: ESOPHAGOGASTRODUODENOSCOPY (EGD) WITH PROPOFOL ;  Surgeon: Onita Elspeth Sharper, DO;  Location: [facility];  Service: Gastroenterology;  Laterality: N/A;   EYE SURGERY     HAND SURGERY Bilateral    KNEE ARTHROSCOPY Left 01/28/2023   Procedure: Left knee arthroscopic major synovectomy;  Surgeon:  Kathlynn Sharper, MD;  Location: [facility];  Service: Orthopedics;  Laterality: Left;   KNEE SURGERY     TOTAL KNEE ARTHROPLASTY Left 07/04/2022   Procedure: TOTAL KNEE ARTHROPLASTY;  Surgeon: Kathlynn Sharper, MD;  Location: [facility];  Service: Orthopedics;  Laterality: Left;   TOTAL KNEE ARTHROPLASTY Right 03/04/2024   Procedure: ARTHROPLASTY, KNEE, TOTAL;  Surgeon: Lorelle Hussar, MD;  Location:  [facility];  Service: Orthopedics;  Laterality: Right;    Allergies: Allergies as of 01/25/2025 - Review Complete 11/24/2024  Allergen Reaction Noted   Lyrica [pregabalin]  06/04/2015   Oxycodone -acetaminophen  Itching     Medications: Outpatient Encounter Medications as of 01/25/2025  Medication Sig   acetaminophen  (TYLENOL ) 500 MG tablet Take 2 tablets (1,000 mg total) by mouth every 8 (eight) hours. (Patient taking differently: Take 1,000 mg by mouth every 6 (six) hours as needed.)   Apoaequorin (PREVAGEN EXTRA STRENGTH) 20 MG CAPS Take 20 mg by mouth daily.   calcium  carbonate (TUMS - DOSED IN MG ELEMENTAL CALCIUM ) 500 MG chewable tablet Chew 2 tablets by mouth as needed for indigestion or heartburn.   chlorhexidine  (HIBICLENS ) 4 % external liquid Apply 15 mLs (1 Application total) topically as directed for 30 doses. Use as directed daily for 5 days every other week for 6 weeks. (Patient not taking: Reported on 03/10/2024)   cholecalciferol (VITAMIN D3) 25 MCG (1000 UNIT) tablet Take 1,000 Units by mouth daily.   cyclobenzaprine  (FLEXERIL ) 10 MG tablet Take 10 mg by mouth at bedtime.   cycloSPORINE  (RESTASIS ) 0.05 % ophthalmic emulsion Place 1 drop into both eyes 2 (two) times daily.   docusate sodium  (COLACE) 100 MG capsule Take 1 capsule (100 mg total) by mouth 2 (two) times daily. (Patient taking differently: Take 100 mg by mouth daily as needed.)   enoxaparin  (LOVENOX ) 40 MG/0.4ML injection Inject 0.4 mLs (40 mg total) into the skin daily for 14 days.   esomeprazole (NEXIUM) 40 MG capsule Take 40 mg by mouth daily.   gabapentin  (NEURONTIN ) 300 MG capsule Take 600 mg by mouth at bedtime. (Patient taking differently: Take 300 mg by mouth at bedtime.)   Homeopathic Products (LEG CRAMPS) TABS Take 2 tablets by mouth 4 (four) times daily.   levothyroxine  (SYNTHROID ) 100 MCG tablet Take 100 mcg by mouth daily before breakfast.   magnesium  oxide (MAG-OX) 400 (240 Mg) MG tablet Take 400 mg by  mouth daily.   Multiple Vitamin (MULTIVITAMIN) capsule Take 1 capsule by mouth daily.   ondansetron  (ZOFRAN ) 4 MG tablet Take 1 tablet (4 mg total) by mouth every 6 (six) hours as needed for nausea.   Probiotic Product (PROBIOTIC PO) Take 1 capsule by mouth daily.   pyridOXINE (VITAMIN B6) 100 MG tablet Take 100 mg by mouth daily.   senna-docusate (SENOKOT-S) 8.6-50 MG tablet Take 2 tablets by mouth daily as needed for mild constipation.   traMADol  (ULTRAM ) 50 MG tablet Take 1 tablet (50 mg total) by mouth every 6 (six) hours as needed for moderate pain (pain score 4-6).   vitamin B-12 (CYANOCOBALAMIN ) 1000 MCG tablet Take 1,000 mcg by mouth daily.   No facility-administered encounter medications on file as of 01/25/2025.    Social History: Social History   Tobacco Use   Smoking status: Former    Current packs/day: 0.00    Types: Cigarettes    Quit date: 1963    Years since quitting: 63.1   Smokeless tobacco: Never  Vaping Use   Vaping status: Never Used  Substance Use Topics   Alcohol use: No   Drug use: No    Family Medical History: No family history on file.  Physical Examination: There were no vitals filed for this visit.    Awake, alert, oriented to person, place, and time.  Speech is clear and fluent. Fund of knowledge is appropriate.   Cranial Nerves: Pupils equal round and reactive to light.  Facial tone is symmetric.    Mild diffuse lower posterior lumbar tenderness.   No abnormal lesions on exposed skin.   Strength: Side Biceps Triceps Deltoid Interossei Grip Wrist Ext. Wrist Flex.  R 5 5 5 5 5 5 5   L 5 5 5 5 5 5 5    Side Iliopsoas Quads Hamstring PF DF EHL  R 5 5 5 5 5 5   L 5 5 5 5 5 5    Reflexes are 2+ and symmetric at the biceps, brachioradialis, patella and achilles.   Hoffman's is absent.  Clonus is not present.   Bilateral upper and lower extremity sensation is intact to light touch.     No pain with IR/ER of both hips.   She ambulates with a  rollator.   Medical Decision Making  Imaging: none  Assessment and Plan: Ms. Palomino has a history of lumbar surgery years ago.   She has constant LBP with intermittent left buttock pain with no leg pain. She has weakness in both legs- has history of bilateral TKA. Pain is worse with standing- using a walker helps. Pain also worse with prolonged sitting.   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   Treatment options discussed with patient and following plan made:   - Lumbar xrays ordered to get on her way out. Will call her with results.  - No significant change in pain since last lumbar MRI. Discussed with patient and will hold on updating it for now.  - PT for lumbar spine- message to PCP Derinda) to see if he will order it at [facility].  - Referral to PMR at [facility] to discuss possible lumbar injections. Has seen Chasnis in the past.  - Follow up with me in 2-3 months and prn.   I spent a total of 30 minutes in face-to-face and non-face-to-face activities related to this patient's care today including review of outside records, review of imaging, review of symptoms, physical exam, discussion of differential diagnosis, discussion of treatment options, and documentation.   Thank you for involving me in the care of this patient.   Glade Boys PA-C Dept. of Neurosurgery  "

## 2025-01-25 ENCOUNTER — Ambulatory Visit: Admitting: Orthopedic Surgery
